# Patient Record
Sex: Female | Born: 1945 | Race: Black or African American | Hispanic: No | Marital: Married | Smoking: Never smoker
Health system: Southern US, Community
[De-identification: ages and names within clinical notes are randomized; demographics above are authoritative.]

## PROBLEM LIST (undated history)

## (undated) DIAGNOSIS — Z8619 Personal history of other infectious and parasitic diseases: Secondary | ICD-10-CM

## (undated) DIAGNOSIS — Z87898 Personal history of other specified conditions: Secondary | ICD-10-CM

## (undated) DIAGNOSIS — I10 Essential (primary) hypertension: Secondary | ICD-10-CM

## (undated) DIAGNOSIS — R32 Unspecified urinary incontinence: Secondary | ICD-10-CM

## (undated) DIAGNOSIS — B379 Candidiasis, unspecified: Secondary | ICD-10-CM

## (undated) DIAGNOSIS — R51 Headache: Secondary | ICD-10-CM

## (undated) HISTORY — PX: REPLACEMENT TOTAL KNEE BILATERAL: SUR1225

## (undated) HISTORY — DX: Essential (primary) hypertension: I10

## (undated) HISTORY — DX: Candidiasis, unspecified: B37.9

## (undated) HISTORY — DX: Personal history of other infectious and parasitic diseases: Z86.19

## (undated) HISTORY — DX: Unspecified urinary incontinence: R32

## (undated) HISTORY — DX: Headache: R51

## (undated) HISTORY — PX: TUBAL LIGATION: SHX77

## (undated) HISTORY — DX: Personal history of other specified conditions: Z87.898

---

## 1974-01-23 HISTORY — PX: NEPHRECTOMY: SHX65

## 1974-01-23 HISTORY — PX: OTHER SURGICAL HISTORY: SHX169

## 1997-08-25 HISTORY — PX: ABDOMINAL HYSTERECTOMY: SHX81

## 1998-02-26 ENCOUNTER — Inpatient Hospital Stay (HOSPITAL_COMMUNITY): Admission: RE | Admit: 1998-02-26 | Discharge: 1998-02-28 | Payer: Self-pay | Admitting: Gynecology

## 1999-06-19 ENCOUNTER — Ambulatory Visit (HOSPITAL_COMMUNITY): Admission: RE | Admit: 1999-06-19 | Discharge: 1999-06-19 | Payer: Self-pay | Admitting: Gastroenterology

## 1999-06-19 ENCOUNTER — Encounter: Payer: Self-pay | Admitting: Gastroenterology

## 2000-02-03 ENCOUNTER — Encounter: Payer: Self-pay | Admitting: Orthopedic Surgery

## 2000-02-05 ENCOUNTER — Inpatient Hospital Stay (HOSPITAL_COMMUNITY): Admission: RE | Admit: 2000-02-05 | Discharge: 2000-02-11 | Payer: Self-pay | Admitting: Orthopedic Surgery

## 2000-02-11 ENCOUNTER — Inpatient Hospital Stay (HOSPITAL_COMMUNITY)
Admission: RE | Admit: 2000-02-11 | Discharge: 2000-02-15 | Payer: Self-pay | Admitting: Physical Medicine & Rehabilitation

## 2000-03-05 ENCOUNTER — Encounter: Admission: RE | Admit: 2000-03-05 | Discharge: 2000-06-03 | Payer: Self-pay | Admitting: Orthopedic Surgery

## 2000-06-16 ENCOUNTER — Encounter: Admission: RE | Admit: 2000-06-16 | Discharge: 2000-07-06 | Payer: Self-pay | Admitting: Orthopedic Surgery

## 2001-02-08 ENCOUNTER — Other Ambulatory Visit: Admission: RE | Admit: 2001-02-08 | Discharge: 2001-02-08 | Payer: Self-pay | Admitting: Family Medicine

## 2003-02-21 ENCOUNTER — Other Ambulatory Visit: Admission: RE | Admit: 2003-02-21 | Discharge: 2003-02-21 | Payer: Self-pay | Admitting: Family Medicine

## 2004-02-23 ENCOUNTER — Other Ambulatory Visit: Admission: RE | Admit: 2004-02-23 | Discharge: 2004-02-23 | Payer: Self-pay | Admitting: Family Medicine

## 2005-02-12 ENCOUNTER — Inpatient Hospital Stay (HOSPITAL_COMMUNITY): Admission: RE | Admit: 2005-02-12 | Discharge: 2005-02-16 | Payer: Self-pay | Admitting: Orthopedic Surgery

## 2005-05-26 ENCOUNTER — Other Ambulatory Visit: Admission: RE | Admit: 2005-05-26 | Discharge: 2005-05-26 | Payer: Self-pay | Admitting: Family Medicine

## 2006-08-26 ENCOUNTER — Encounter: Admission: RE | Admit: 2006-08-26 | Discharge: 2006-08-26 | Payer: Self-pay | Admitting: Family Medicine

## 2007-12-09 ENCOUNTER — Encounter: Admission: RE | Admit: 2007-12-09 | Discharge: 2007-12-09 | Payer: Self-pay | Admitting: Family Medicine

## 2008-12-13 ENCOUNTER — Encounter: Admission: RE | Admit: 2008-12-13 | Discharge: 2008-12-13 | Payer: Self-pay | Admitting: Internal Medicine

## 2009-01-01 ENCOUNTER — Emergency Department (HOSPITAL_COMMUNITY): Admission: EM | Admit: 2009-01-01 | Discharge: 2009-01-01 | Payer: Self-pay | Admitting: Emergency Medicine

## 2010-01-30 ENCOUNTER — Encounter: Admission: RE | Admit: 2010-01-30 | Discharge: 2010-01-30 | Payer: Self-pay | Admitting: Internal Medicine

## 2011-01-09 ENCOUNTER — Other Ambulatory Visit: Payer: Self-pay | Admitting: Internal Medicine

## 2011-01-09 DIAGNOSIS — Z1231 Encounter for screening mammogram for malignant neoplasm of breast: Secondary | ICD-10-CM

## 2011-01-10 NOTE — Discharge Summary (Signed)
[place]. [facility]  Patient:    Nicole Oconnor, Nicole Oconnor                       MRN: [id number] Adm. Date:  [id number] Disc. Date: 02/15/00 Attending:  Herold Harms Dictator:   Bynum Bellows. Idacavage, P.A.C. CC:         Faith Rogue, M.D.             Daniel L. Thomasena Edis, M.D.             Hadassah Pais. Jeannetta Nap, M.D.             John L. Dorothyann Gibbs, M.D.                           Discharge Summary  DIAGNOSES: 1. Right total knee arthroplasty. 2. Hypertension. 3. Electrolyte imbalance. 4. Pruritic rash. 5. Coumadin anticoagulation.  HISTORY OF PRESENT ILLNESS AND HOSPITAL COURSE:  The patient is a 65 year old female with history of bilateral knee pain who elected to undergo right total knee arthroplasty February 05, 2000, by Dr. Priscille Kluver.  She was placed on Coumadin for DVT prophylaxis.  The patient was diuresed.  She did have hyponatremia and hypokalemia which were corrected.  It was felt that the patient would require inpatient rehabilitation prior to returning home.  Therefore, on February 11, 2000, she was admitted to inpatient rehabilitation, where she has participated in three hours per day of physical and occupational therapy.  She was noted to be subtherapeutic on her Coumadin upon admission and, therefore, was placed on Lovenox 40 mg subcutaneous q.d. until her INR was in the therapeutic range. She did have elevated LFTs in the following trend.  On February 03, 2000, her alkaline phosphatase was 58; however, on February 12, 2000, it was 176.  Her SGOT on February 03, 2000, was 15, and on February 12, 2000, was 10.  Her SGPT on February 03, 2000, was 50, and on February 12, 2000, was 181.  These levels were rechecked on February 14, 2000, and did, indeed, appear to be trending downward.  Alkaline phosphatase was 162, SGOT 66, and SGPT 137.  This was felt to be due to anesthetic or side effect of surgery.  The patient was noted to have positive nitrates in her urine, and she was started on Cipro  for this.  Her urine did grow out 1000 colonies of E. coli, which was sensitive to Cipro.  Her blood pressures remained controlled on her present regimen, and she did quite well in physical therapy.  At the time of discharge she was noted to be modified independent with bed mobility and transfers and able to ambulate 200 feet with a standard walker.  DISCHARGE MEDICATIONS: 1. Coumadin daily until March 04, 2000. 2. Percocet 1 or 2 every four hours as needed for pain. 3. Potassium 20 mEq daily. 4. OxyContin CR 10 mg every 12 hours for five days. 5. Multivitamin daily. 6. Pepcid 20 mg at bedtime. 7. Atenolol 50 mg daily.  8. Hydrochlorothiazide 50 mg daily.  9. Vitamin E 400 IU daily. 10. Os-Cal 1000 mg daily. 11. Cipro 500 mg b.i.d.  ACTIVITY:  The patient is instructed to weightbear as tolerated, use walker.  DIET:  Follow a balanced diet.  WOUND CARE:  Keep wound clean and dry.  DISCHARGE INSTRUCTIONS:  Go to Dr. Jeannetta Nap office on Wednesday for PT/INR.  He will be following as per  his nurse.  FOLLOW-UP:  Patient instructed to follow up with Dr. Priscille Kluver in two weeks and Dr. Jeannetta Nap as he instructs.  CONDITION ON DISCHARGE:  Stable. DD:  02/14/00 TD:  02/16/00 Job: [id number]

## 2011-01-10 NOTE — Discharge Summary (Signed)
NAME:  Nicole Oconnor, Nicole Oconnor                ACCOUNT NO.:  [id number]   MEDICAL RECORD NO.:  [id number]          PATIENT TYPE:  INP   LOCATION:  1512                         FACILITY:  [facility]   PHYSICIAN:  John L. Rendall, M.D.  DATE OF BIRTH:  [birth year]   DATE OF ADMISSION:  02/12/2005  DATE OF DISCHARGE:  02/16/2005                                 DISCHARGE SUMMARY   ADMISSION DIAGNOSES:  1.  End-stage osteoarthritis, bilateral knees, status post right total knee      arthroplasty in 2001.  2.  History of hypertension.  3.  History of obesity.  4.  History of solitary kidney.  5.  History of constipation.   DISCHARGE DIAGNOSES:  1.  Left total knee arthroplasty.  2.  Postoperative blood loss anemia, not requiring blood transfusion.  3.  Postoperative hypokalemia.  4.  History of hypertension.  5.  History of obesity.  6.  History of solitary kidney.  7.  History of constipation.   HISTORY OF PRESENT ILLNESS:  The patient is a 65 year old black female with  a history of right total knee arthroplasty in 2001 with good results.  He  presents with a 5-year history of progressively worsening left knee pain.  He fell on the ice in December of 2005 and twisted his left knee with  worsening of the pain and continuous pain since that time, a deep aching  sensation with sharp shooting pains over the joint with radiations up an  down the tibia, worsened with walking.  She does have popping, catching,  grinding and night pain.   ALLERGIES:  PENICILLIN, TAPE INCLUDING BAND-AIDS and PAPER TAPE.   CURRENT MEDICATIONS:  1.  Hydrochlorothiazide 50 mg p.o. daily.  2.  Atenolol 50 mg p.o. daily.  3.  Aspirin 81 mg p.o. daily.  4.  Multivitamin once a day.  5.  Vitamin E 400 mg p.o. daily.  6.  Calcium 1200 mg p.o. daily.  7.  Potassium one tablet p.o. daily.  8.  Vitamin C 500 mg p.o. daily.   SURGICAL PROCEDURE:  On February 12, 2005, the patient was taken to the  operating room by Dr. Jonny Ruiz L.  Rendall, assisted by Richardean Canal, P.A.-C.  Under general anesthesia, the patient underwent a left total knee  arthroplasty with the following components:  A size standard left femoral  component, a size 2.5 keeled tibial tray, a standard patella, a size 20-mm  bearing.  All components were implanted with polymethylmethacrylate.  The  patient tolerated the procedure well and was transferred to the recovery  room and then to the orthopedic floor for total knee protocol and IV pain  medicines, antibiotic, DVT prophylaxis and leg in a CPM.   CONSULTATIONS:  The following consults were requested:  1.  Physical Therapy.  2.  Occupational Therapy.  3.  Case Management.   HOSPITAL COURSE:  On February 12, 2005, the patient was admitted to [facility] under the care of Dr. Jonny Ruiz Rendall.  The patient was taken to the  OR where a left total knee arthroplasty was performed  without any  complications.  The patient was transferred to the recovery room and to the  orthopedic floor in good condition on IV antibiotics, pain medications and  leg in a CPM, and Arixtra for DVT prophylaxis, to follow with total knee  protocol.  The patient incurred a total of 4 days of postoperative  __________ on the orthopedic floor without any complications.   The patient did develop some postoperative blood loss anemia with her  hemoglobin dropping to 9.6, but her vital signs remained stable.  She did  not require any blood transfusion.  This was allowed to self-correct with  supplemental replacement.  The patient did develop some significant  postoperative hypokalemia; potassium dropped to 2.7, but with IV replacement  and p.o. replacement over several days, it did improve.  The patient's wound  remained benign for any signs of infection.  Her leg remained neuromotor and  vascularly intact.  The patient worked well with Physical Therapy on a day-  to-day basis.  The patient was able to transition from IV  medications to  p.o. medications well, so arrangements were made on postoperative day #4 for  outpatient home health physical therapy.  The patient did have a urine  culture that showed 100,000 colonies of E. coli, so the patient was placed  on Cipro prior to being discharged home in good condition.   LABORATORY DATA:  CBC on February 15, 2005:  WBC of 14.4, felt to be due to  urinary tract infection, hemoglobin 9.7, hematocrit 28.9.  The patient was  asymptomatic, out of bed with Physical Therapy.  Platelets were 250,000.   Routine chemistries on February 15, 2005 showed a sodium of 132, potassium 3.8,  glucose 134; it was 96 on admission so it was felt to be postoperative  stress from activity.  We will be following her as an outpatient.  BUN 8,  creatinine 0.9.  A urine culture was 100,000 colonies of E. coli, sensitive  to all routine antibiotics.   MEDICATIONS ON DISCHARGE FROM ORTHOPEDIC FLOOR:  1.  Hydrochlorothiazide 50 mg p.o. daily.  2.  Multivitamins with minerals one tablet p.o. daily.  3.  Arixtra 2.5 mg daily.  4.  Colace 100 mg p.o. b.i.d.  5.  Trinsicon one tablet p.o. t.i.d.  6.  Tenormin 50 mg p.o. daily.  7.  Potassium chloride 20 mEq p.o. b.i.d.  8.  Laxative and enema of choice p.r.n.  9.  Percocet 5 mg p.o. q.4 h. p.r.n.  10. Tylenol 750 mg p.o. q.4 h. p.r.n.  11. Robaxin 500 mg p.o. q.6 h. p.r.n.  12. Restoril 50 mg p.o. nightly p.r.n.  13. Reglan 10 mg p.o. q.8 h. p.r.n.   DISCHARGE INSTRUCTIONS:   DIET:  No restrictions.   ACTIVITY:  Walk with the assistance of a walker.   WOUND CARE:  The patient is to keep wound clean, change dressing daily, may  shower on the following Monday.   SPECIAL DISCHARGE INSTRUCTIONS:  For any concerns of infection or any other  problems, the patient is to call Dr. Sable Feil office for advise.   DISCHARGE MEDICATIONS:  The patient was to resume routine home medications  with the addition of the following: 1.  Arixtra 2.5 mg  injection once a day for 3 more days.  2.  Percocet 5 mg one or two tablets every 4-6 hours for pain if needed.  3.  Robaxin 500 mg one tablet every 6 hours for muscle spasms if needed.  4.  Cipro 500 mg one tablet twice a day for 7 days.   FOLLOWUP:  1.  The patient needs a followup appointment with Dr. Priscille Kluver; the patient      is to call [phone] for a followup appointment in 2 weeks.  2.  Home health physical therapy by Genevieve Norlander.   CONDITION UPON DISCHARGE:  The patient's condition upon discharge to home is  listed as improved and good.       RWK/MEDQ  D:  03/04/2005  T:  03/04/2005  Job:  [id number]

## 2011-01-10 NOTE — Discharge Summary (Signed)
[facility]  Patient:    Nicole Oconnor, Nicole Oconnor                       MRN: [id number] Adm. Date:  [id number] Disc. Date: 02/11/00 Attending:  Carolan Shiver Ii Dictator:   Jamelle Rushing, P.A.                           Discharge Summary  ADMISSION DIAGNOSES: 1. Osteoarthritis, right knee. 2. Hypertension. 3. History of one kidney.  DISCHARGE DIAGNOSES: 1. Status post right total knee arthroplasty. 2. Hypertension. 3. Constipation. 4. Hypokalemia. 5. Hyponatremia.  HISTORY OF PRESENT ILLNESS:  This is a 65 year old black female with a six to seven year history of bilateral knee pain.   The knee problems on the right started with popping and squeaking sensation and gradually increased and developed pain.  The patient did have occasional giving out and generalized warmth feeling in the knee.  The patient describes the pain as a constant aching sensation with occasional sharp shooting pains with awkward movements. The patient does have an aching while in bed and difficulty sleeping.  ALLERGIES:  Penicillin.  When tapes are applied to her neck she does have a rash and itching but when the same tapes are applied to other places on her body she does not have the same reaction.  CURRENT MEDICATIONS ON ADMISSION:  Hydrochlorothiazide 50 mg p.o. q.d. Atenolol 50 mg p.o. q.d.  Baby aspirin once a day.  Multivitamin one tablet p.o. q.d.  Vitamin E 1000 mg p.o. q.d.  Calcium 1200 mg p.o. q.d.  SURGICAL PROCEDURE:  On February 05, 2000 patient was taken to the OR by Dr. Jonny Ruiz L. Rendall assisted by Jamelle Rushing, P.A.-C. and underwent right total knee arthroplasty under general anesthesia.  The patient tolerated the surgical procedure well without any complications and was transferred to the recovery room in good condition. Prior to transferring to the recovery room the patient had a epidural placed for postoperative analgesia.  CONSULTS:  On February 05, 2000  anesthesia consult was requested for postoperative analgesia with epidural.  On February 05, 2000 the following routine consults were also requested:  Physical therapy, occupational therapy, rehabilitation, and pharmacy for Coumadin dosing.  HOSPITAL COURSE:  On February 05, 2000 the patient was admitted to [facility] under the care of Dr. Jonny Ruiz L. Rendall. The patient was taken to the OR where a right total knee arthroplasty was performed without any complications under general anesthesia.  The patient had an epidural placed for postoperative analgesia.  The patient did have some problems with postoperative analgesia requiring several calls to anesthesia throughout the night for some hypotension which was not responsive to fluid boluses.  The patient had multiple fluid boluses and decreased amount of epidural administration throughout the night.  The patient continued to maintain excellent pain control through this course.  Postoperative day one, the patient was fairly comfortable after being converted over to the morphine PCA having the epidural discontinued throughout the night.  The patient was reported to have been hypertensive throughout the night and received multiple fluid boluses and at the current time, I calculated she was ahead 2600 cc of fluid just since reaching the orthopaedic floor. The patient had no other complaints.  No shortness of breath or chest pain, nausea.  She was tolerating the CPM 0 to 60 degrees.  According to the CPM but due to  loose straps, the patient was actually only flexing from about 0 to 25 degrees.  The patients vital signs at the current time, she was afebrile with a blood pressure of 97/52.  H&H was stable and her chemistries at the current time were also stable.  It was felt that since she was ahead that far in fluid and that some mild extra diuresis throughout the day would improve her fluid balance and prevent any rebound hypertension once the  PCA is discontinued.  She was given Lasix 40 mg IV today with close monitoring of her ins and outs and breath sounds.  Postoperative day two, the patients only complaint was feeling like she needed to have a bowel movement.  Otherwise she had shortness of breath or chest discomfort.  She was in fact in the CMP 0 to 40 degrees and that was when she was actually bending.  She had temperature of 101. Vital signs: Blood pressure 146/85.  Her H&H was in fact stable. Her sodium dropped to 124, potassium was down to 3.0. This was felt related to her diuresis the previous day.  Otherwise, the patient was doing very well and was to start physical therapy today.  Due to her potassium and sodium dropping it was felt that we would give her 20 mEq p.o. q.d. of potassium chloride and change her IV over to normal saline and run it at 80 cc an hour over the next 12 hours and monitor her ins and outs.  The patient was also to get LOC or EOC to help her bowel movement.  Over the next several days, the patient continued to do very well without much complaints other than residual discomfort and difficulty having bowel movements.  The patient lung sounds remained clear and equal bilaterally with no complaints of chest pain or shortness of breath.  Her I&R gradually improved to the therapeutic level for DVT prophylaxis.  On postoperative day five, the patient was at the bedside commode. She had had previous bowel movements but she still had a moderate amount of abdominal discomfort with the feeling like she needed to have a good bowel movement and was unable to do so. She had no nausea or vomiting and was tolerating her diet well.  The patient was progressing slowly at this time with physical therapy and it was felt that reevaluation by rehab services was warranted for rehab services for four or five days.  BMET was requested and her sodium and potassium are much improved and within normal limits at the  current time. The patient was also to get an enema to aid in improvement of her discomfort.  On postoperative day six, the patient was feeling much better today, having  had a good bowel movement on the day previous.  The patient did have some calf tenderness the previous night so a venous Doppler was obtained to rule out any DVT and this was in fact negative.  The patient had no other complaints at this time and felt she was ready to go to rehab to improve her mobility.  It was anticipated after reading the previous note from rehab services that we would get preauthorization from patients insurance company and bed would be available today, so she would be in fact transferred to rehab services today.  LABORATORY DATA:  CBC on February 08, 2000 WBC 10.9 down from 15.6 on the other previous date.  Hemoglobin was 10.0, hematocrit 28.8 with 257,000 platelets. Coagulation studies found PT of 17.0 with 1.7  INR.  Routine chemistries on February 10, 2000 found sodium 138, potassium 3.3, all other values normal.  Urinalysis was normal on admission.  EKG on admission was normal sinus at 65 beats per minute.  DISCHARGE MEDICATIONS: 1. Colace 100 mg p.o. b.i.d. 2. Atenolol 50 mg p.o. q.d. 3. Multivitamins one tablet p.o. q.d. 4. Hydrochlorothiazide 50 mg p.o. q.d. 5. Calcium carbonate 1000 mg p.o. q.d. 6. Vitamin E 400 iu p.o. q.d. 7. OxyContin CR 20 mg p.o. q.12h. 8. Potassium chloride 20 mEq p.o. q.d.  CONDITION ON DISCHARGE TO REHAB SERVICES:  Improved. DD:  02/11/00 TD:  02/11/00 Job: [id number]

## 2011-01-10 NOTE — H&P (Signed)
NAME:  Nicole Oconnor, Nicole Oconnor                ACCOUNT NO.:  [id number]   MEDICAL RECORD NO.:  [id number]          PATIENT TYPE:  INP   LOCATION:  NA                           FACILITY:  [facility]   PHYSICIAN:  John L. Rendall, M.D.  DATE OF BIRTH:  05-04-1946   DATE OF ADMISSION:  02/12/2005  DATE OF DISCHARGE:                                HISTORY & PHYSICAL   CHIEF COMPLAINT:  Left knee pain on and off for about the last five years.   HISTORY OF PRESENT ILLNESS:  This 65 year old black female patient presented  to Dr. Priscille Kluver with a history of a right knee replacement done by Dr.  Priscille Kluver on February 05, 2000.  Her right knee has been doing well, but her left  knee has been bothering her on and off since she had her right knee done.  In December 2005, she fell on some ice and twisted the left knee.  Since  that time, the left knee pain has been continuous.  Otherwise, the knee had  been getting progressively worse over time.   At this point, the pain is described as constant ache which can become very  sharp or stabbing at times over the joint line, with occasional radiation  into the tibia.  Pain increases with any prolonged walking, and then  decreases with the use of Tylenol.  She is unable to take NSAIDs due to  having only one kidney.  The knee does pop at times.  It catches, grinds,  and it can keep her up at night.  No locking or giving way.  She is  currently taking Tylenol or Darvocet for pain relief, and that provides just  some relief.  She has received two cortisone injections in the past, and the  first one helped for a couple of weeks.  The second one did not help at all.  She currently uses a cane just as needed.   ALLERGIES:  1.  PENICILLIN causes throat edema.  2.  ADHESIVE TAPE.  She gets a rash and itching due to tape.  Paper tape      seems to do this the least.   CURRENT MEDICATIONS:  1.  Hydrochlorothiazide 50 mg 1 tablet p.o. q.a.m.  2.  Atenolol 50 mg 1 tablet p.o.  q.a.m.  3.  Aspirin 81 mg 1 tablet p.o. q.a.m., last dose February 02, 2005.  4.  Multivitamin 1 tablet p.o. q.a.m.  5.  Vitamin E 400 International Units 1 tablet p.o. q.a.m.  6.  Calcium 1,200 mg 1 tablet p.o. q.a.m.  7.  Potassium 1 tablet p.o. q.a.m.  8.  Vitamin C 500 mg 1 tablet p.o. q.a.m.   PAST MEDICAL HISTORY:  1.  Hypertension, diagnosed April 1999.  2.  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   She denies any history of diabetes mellitus, thyroid disease, hiatal hernia,  peptic ulcer disease, reflux, heart problems, asthma, or any other chronic  medical condition other than noted previously.   PAST SURGICAL HISTORY:  1.  Multiple skin moles removed January 27, 2000.  2.  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  3.  Appendectomy, 1957.  4.  Left breast biopsy, benign, 1994.  5.  Right breast biopsy, benign, 1996.  6.  Hysterectomy, 1999.  7.  Right total knee arthroplasty by Dr. Jonny Ruiz L. Rendall, June 2001.   She denies any complications from the above-mentioned procedures.   SOCIAL HISTORY:  She denies any history of cigarette smoking or drug use.  She does drink an occasional glass of wine.  She is married and has three  children.  She lives with her husband in a two-story house with five to six  steps into the main entrance.  Her husband does have MS.  Most of the living  facilities are on the second floor, so she will have to go upstairs.  Her  medical doctor is Dr. Talmadge Coventry at [phone].  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   FAMILY HISTORY:  Mother died at the age of 84 with a cerebral hemorrhage.  Father died at the age of 48 with a myocardial infarction.  She has one  sister who passed away at age 69 with a cerebral hemorrhage.  She has four  living brothers, one with a history of hypertension.  She has three living  sisters, one with a history of diabetes mellitus.  Her children are alive  and in good health.  She has  two daughters, age 61 and 33, and one son, age  26.   REVIEW OF SYSTEMS:  She does wear glasses at all times.  She has occasional  headaches which she relates as due to sinus congestion.  She does have  occasional tinnitus in the right ear.  She complains of hoarseness first  thing in the morning with damp weather, and that resolves with hot fluids.  She has had problems with a stiff neck and a sore left hip since her fall in  December 2005.  She has been told in the past that she might have a heart  murmur but requires no treatment for it.  She did have bronchitis in 2003.  She has some constipation complaints which she treats just with diet.  She  does not have a living will, and her power of attorney is Lance Bosch,  Sr.  All other systems are negative and noncontributory.   PHYSICAL EXAMINATION:  GENERAL:  Well-developed, well-nourished overweight  black female in no acute distress.  Talks easily with examiner.  Walks  without a limp.  Height 5 feet 5 inches; weight 242 pounds.  BMI is 39.5.  VITAL SIGNS:  Temperature 98.1 degrees Fahrenheit; pulse 64; respirations  18; BP 132/84.  HEENT:  Normocephalic, atraumatic, without frontal or maxillary sinus  tenderness to palpation.  Conjunctivae pink.  Sclerae anicteric.  PERRLA.  EOMs intact.  She has multiple scattered about her face, but they are all  basically only slightly raised.  Hearing grossly intact.  No visible  external ear deformities.  Both ear canals are occluded with a large amount  of soft cerumen.  Nose and nasal septum midline.  Nasal mucosa pink and  moist, without exudates or polyps noted.  Buccal mucosa pink and moist.  Dentition in good repair.  Pharynx without erythema or exudates.  Tongue and  uvula midline.  Tongue without spiculations, and uvula rises equally with  phonation.  NECK:  No visible masses or lesions noted.  Trachea midline.  No palpable lymphadenopathy or thyromegaly.  Carotids +2 bilaterally,  without bruits.  Full range of motion of the cervical spine with pain on the right side with  rotation to the left.  CARDIOVASCULAR:  Heart rate and rhythm regular.  S1, S2 present, with a  grade 2/6 systolic murmur heard best at the right second intercostal space  at the sternal border.  RESPIRATORY:  Respirations even and unlabored.  Breath sounds clear to  auscultation bilaterally, without rales or wheezes noted.  ABDOMEN:  Rounded abdominal contour.  Bowel sounds present x 4 quadrants.  Soft, nontender to palpation, without hepatosplenomegaly or CVA tenderness.  Femoral pulses +1 bilaterally.  Nontender to palpation along the vertebral  column.  BREAST/GU/RECTAL/PELVIC:  These exams deferred at this time.  MUSCULOSKELETAL:  No obvious deformities bilateral upper extremities, with  full range of motion of these extremities without pain.  Radial pulses +2  bilaterally.  She has full range of motion of her hips, ankles, and toes  bilaterally.  DP and PT pulses are +2, and she does have +2 mildly pitting  edema of both lower extremities.  No calf pain with palpation bilaterally.  Right knee has a well-healed midline incision.  No erythema or ecchymosis.  She has full extension and flexion only to 90 degrees.  This may be impeded  because of the size of her thigh.  She does open a little bit to varus and  valgus stress, about 5 degrees to each side, but this seemed to be equal.  She does have some mild pain with palpation over the medial joint line.  No  effusion.  Negative anterior drawer.  Left knee skin intact, without  erythema or ecchymosis.  She does have an area that is depigmented from a  previous cortisone injection along the medial joint line.  She has full  extension of the knee and flexion only to 90 degrees.  Again, this may be  limited due to the size of her thigh.  There is a mild amount of crepitus  with range of motion.  She is acutely tender to palpation on both the  medial  and lateral joint line.  Stable to varus and valgus stress.  Negative  anterior drawer.  NEUROLOGIC:  Alert and oriented x 3.  Cranial nerves II-XII are grossly  intact.  Strength 5/5 bilateral upper and lower extremities.  Rapid  alternating movements intact.  Deep tendon reflexes 2+ bilateral upper and  lower extremities.   RADIOLOGIC FINDINGS:  X-rays taken of the left knee in February 2006 showed  bone-on-bone contact in the knee with femoral subluxation over the tibia  medially.   IMPRESSION:  1.  End-stage osteoarthritis bilateral knees, status post right knee      replacement in June 2001.  2.  Hypertension.  3.  Obesity.  4.  Solitary kidney.  5.  History of constipation.   PLAN:  Ms. Keilman will be admitted to [facility] on February 12, 2005,  where she will undergo a left total knee arthroplasty by Dr. Jonny Ruiz L.  Rendall.  She will undergo all the routine preoperative laboratory tests and studies prior to this procedure.  If we have any medical issues while she is  hospitalized, we will consult Dr. Smith Mince.       KED/MEDQ  D:  02/04/2005  T:  02/04/2005  Job:  [id number]

## 2011-01-10 NOTE — Op Note (Signed)
[place]  Patient:    Nicole Oconnor, Nicole Oconnor                       MRN: [id number] Proc. Date: 02/05/00 Adm. Date:  [id number] Attending:  Carolan Shiver Ii                           Operative Report  PREOPERATIVE DIAGNOSIS:  Osteoarthritis, right knee.  POSTOPERATIVE DIAGNOSIS:  Osteoarthritis, right knee.  PROCEDURE:  Right LCS total knee replacement.  SURGEON:  John L. Dorothyann Gibbs, M.D.  ASSISTANT:  Jamelle Rushing, P.A.  ANESTHESIA:  General.  PATHOLOGY:  Primarily medial compartment osteoarthritis with bow leg deformity and very firm hard bone.  DESCRIPTION OF PROCEDURE:  Under general anesthesia, the patients leg was prepared with DuraPrep and draped as a sterile field with a sterile tourniquet.  It was wrapped out with the Esmarch and the tourniquet was used at 400 mm.  A midline incision was made.  This carried to the medial parapatella.  Multiple small vessels were cauterized.  The femur was sized as a standard.  Proximal tibial resection was then carried out using the first femoral guide.  An intrachondral drill hole was placed.  Using the second guide, anterior and posterior flare of the distal femur was resected after first balancing the flexion at 12.5 mm.  The distance required release of the old medial collateral ligament and capsule medially to balance fairly loose lateral side.  Once this was completed, the intramedullary guide was used and a distal femoral cut was made with a 12.5 extension gap.  A recessing guide was then used.  A lamina spreader was inserted and remnants of the cruciates and collateral ligaments were resected and spurs on the back of the condyle were removed.  Following this, the ______ was inserted and Homan retractor and the tibia sized to the standard plus.  A center peg hole was placed. Trial reduction of a standard plus tibia, 12.5 rotating platform, standard femur, revealed excellent fit and  alignment when the capsule was held in place.  The patella was then osteotomized and three peg holes were drilled. Bony surfaces were then prepared with pulse irrigation.  The tibial component was cemented in place.  The femur and patella were press fit.  Once cement was hardened and excess removed, the tourniquet was let down at 44 minutes. Multiple small vessels were cauterized.  The knee was then closed in layers in flexion using 1-0 Ti-Cron, 2-0 Vicryl, and skin clips.  Operative time one hour and four minutes.  The patient tolerated the procedure well and returned to recovery in good condition.  The knee was infiltrated with Marcaine with morphine with epinephrine prior to application of her dressing. DD:  02/05/00 TD:  02/07/00 Job: [id number]

## 2011-01-10 NOTE — Op Note (Signed)
NAME:  Nicole Oconnor, Nicole Oconnor                ACCOUNT NO.:  [id number]   MEDICAL RECORD NO.:  [id number]          PATIENT TYPE:  INP   LOCATION:  X001                         [facility]ACILITY[facility]   PHYSICIAN:  John L. Rendall, M.D.  DATE OF BIRTH:  07/10/1946   DATE OF PROCEDURE:  02/12/2005  DATE OF DISCHARGE:                                 OPERATIVE REPORT   PREOPERATIVE DIAGNOSIS:  Osteoarthritis, left knee.   SURGICAL PROCEDURE:  Computer-navigation-assisted left LCS total knee  replacement.   POSTOPERATIVE DIAGNOSIS:  Osteoarthritis, left knee.   SURGEON:  John L. Rendall, M.D.   ANESTHESIA:  General with femoral nerve block.   ASSISTANT:  Rexene Edison, P.A.   PROCEDURE:  Under general anesthesia, the left leg is prepared with DuraPrep  and draped as a sterile field.  A midline incision is made after wrapping  out the leg with an Esmarch and inflating a sterile tourniquet at 400 mm.  The patella is everted.  The femur is sized to a standard.  Debridement is  done in preparation for computer navigation.  Two pins are then placed in  the proximal-third tibia and distal-third femur, medially in both cases.  The navigation system is set up.  The femoral head is identified.  Medial  and lateral malleolus are identified.  Proximal tibia is mapped and distal  femur is mapped.  Using the navigation, proximal tibial resection is carried  out with a 9-mm resection.  This is then checked and is within 1 degree of  accuracy when we are completed.  Balancing of the flexion and extension gap  is then done with balancing of collateral ligaments.  Once this is  completed, the navigation is used for the anterior and posterior flare of  the distal femur.  The distal femoral cut and the recessing guide is then  used.  A lamina spreader is inserted.  Remnants of menisci are removed and  cruciates.  Proximal tibia is sized at a 2.5.  Trial seating of a 2.5 tibia,  15 bearing and standard femur reveals  excellent bone fits on tibia and  femur, but too much laxity, particularly in extension.  This was then  upsized to a 17.5, which is improved, but a 20 gave optimal balancing.  We  went from 6.5-degree fixed flexion deformity with a 7-degree fixed varus to  1-degree varus and -2 degrees' hyperextension.  Once this was completed,  Schanz pins were removed, the patella was osteotomized.  Permanent  components were then obtained, bony surfaces prepared for pulse irrigation.  All components were then cemented in place and tourniquet was let down at 1  hour 30 minutes.  The size of the thigh slowed the case somewhat, as there  was a 3-inch proximal lipid layer.  At this point, the excess cement is  removed, multiple small vessels are cauterized, a medium Hemovac drain is  inserted. The fascia is then closed with #1 Tycron, subcu with #1 and 2-0  Vicryl and the skin with clips.  Operative time -- about an hour and 45  minutes.  The patient  tolerated the procedure well.  The knee had excellent  stability in flexion and extension on completion.     JLR/MEDQ  D:  02/12/2005  T:  02/12/2005  Job:  [id number]

## 2011-03-12 ENCOUNTER — Ambulatory Visit
Admission: RE | Admit: 2011-03-12 | Discharge: 2011-03-12 | Disposition: A | Discharge status: Home / Self Care | Payer: Medicare Other | Source: Ambulatory Visit | Attending: Internal Medicine | Admitting: Internal Medicine

## 2011-03-12 DIAGNOSIS — Z1231 Encounter for screening mammogram for malignant neoplasm of breast: Secondary | ICD-10-CM

## 2011-11-05 DIAGNOSIS — N182 Chronic kidney disease, stage 2 (mild): Secondary | ICD-10-CM | POA: Diagnosis not present

## 2011-11-05 DIAGNOSIS — I129 Hypertensive chronic kidney disease with stage 1 through stage 4 chronic kidney disease, or unspecified chronic kidney disease: Secondary | ICD-10-CM | POA: Diagnosis not present

## 2011-11-05 DIAGNOSIS — Z79899 Other long term (current) drug therapy: Secondary | ICD-10-CM | POA: Diagnosis not present

## 2011-11-05 DIAGNOSIS — M109 Gout, unspecified: Secondary | ICD-10-CM | POA: Diagnosis not present

## 2012-02-04 ENCOUNTER — Other Ambulatory Visit: Payer: Self-pay | Admitting: Internal Medicine

## 2012-02-04 DIAGNOSIS — Z1231 Encounter for screening mammogram for malignant neoplasm of breast: Secondary | ICD-10-CM

## 2012-03-11 ENCOUNTER — Encounter: Payer: Medicare Other | Admitting: Obstetrics and Gynecology

## 2012-03-17 ENCOUNTER — Ambulatory Visit: Payer: Medicare Other

## 2012-03-31 ENCOUNTER — Ambulatory Visit (INDEPENDENT_AMBULATORY_CARE_PROVIDER_SITE_OTHER): Payer: Medicare Other | Admitting: Obstetrics and Gynecology

## 2012-03-31 ENCOUNTER — Encounter: Payer: Self-pay | Admitting: Obstetrics and Gynecology

## 2012-03-31 VITALS — BP 118/78 | Ht 63.5 in | Wt 260.0 lb

## 2012-03-31 DIAGNOSIS — Z90711 Acquired absence of uterus with remaining cervical stump: Secondary | ICD-10-CM

## 2012-03-31 DIAGNOSIS — R87610 Atypical squamous cells of undetermined significance on cytologic smear of cervix (ASC-US): Secondary | ICD-10-CM | POA: Diagnosis not present

## 2012-03-31 DIAGNOSIS — Z124 Encounter for screening for malignant neoplasm of cervix: Secondary | ICD-10-CM

## 2012-03-31 DIAGNOSIS — N841 Polyp of cervix uteri: Secondary | ICD-10-CM

## 2012-03-31 DIAGNOSIS — Z01419 Encounter for gynecological examination (general) (routine) without abnormal findings: Secondary | ICD-10-CM

## 2012-03-31 NOTE — Progress Notes (Signed)
  Contraception SCHyst Last pap 2011 wnl Last Mammo 2012 wnl Last Colonoscopy 2012 Last Dexa Scan 3 yrs ago wnl Primary MD Dr. Allyne Gee Abuse at Home None  No complaints  Filed Vitals:   03/31/12 1408  BP: 118/78   ROS: noncontributory  Physical Examination: General appearance - alert, well appearing, and in no distress, multiple Nevi Neck - supple, no significant adenopathy Chest - clear to auscultation, no wheezes, rales or rhonchi, symmetric air entry Heart - normal rate and regular rhythm Abdomen - soft, nontender, nondistended, no masses or organomegaly Breasts - breasts appear normal, no suspicious masses, no skin or nipple changes or axillary nodes Pelvic - normal external genitalia, vulva, vagina, cervix, 2 small (<24mm) polyps in cervical canal Back exam - no CVAT Extremities - no edema, redness or tenderness in the calves or thighs Rectal exam neg masses  A/P s/p SCH and BSO in 1999 Mammo tomorrow Nevi followed by PCP per pt Pap today and removal of endocervical polyps with consent

## 2012-04-01 ENCOUNTER — Ambulatory Visit
Admission: RE | Admit: 2012-04-01 | Discharge: 2012-04-01 | Disposition: A | Discharge status: Home / Self Care | Payer: Medicare Other | Source: Ambulatory Visit | Attending: Internal Medicine | Admitting: Internal Medicine

## 2012-04-01 DIAGNOSIS — Z1231 Encounter for screening mammogram for malignant neoplasm of breast: Secondary | ICD-10-CM

## 2012-04-01 LAB — PAP IG W/ RFLX HPV ASCU

## 2012-04-08 ENCOUNTER — Encounter: Payer: Self-pay | Admitting: Obstetrics and Gynecology

## 2012-04-15 DIAGNOSIS — N959 Unspecified menopausal and perimenopausal disorder: Secondary | ICD-10-CM | POA: Diagnosis not present

## 2012-04-21 DIAGNOSIS — Z79899 Other long term (current) drug therapy: Secondary | ICD-10-CM | POA: Diagnosis not present

## 2012-04-21 DIAGNOSIS — N182 Chronic kidney disease, stage 2 (mild): Secondary | ICD-10-CM | POA: Diagnosis not present

## 2012-04-21 DIAGNOSIS — Z0000 Encounter for general adult medical examination without abnormal findings: Secondary | ICD-10-CM | POA: Diagnosis not present

## 2012-04-21 DIAGNOSIS — H6120 Impacted cerumen, unspecified ear: Secondary | ICD-10-CM | POA: Diagnosis not present

## 2012-04-21 DIAGNOSIS — I129 Hypertensive chronic kidney disease with stage 1 through stage 4 chronic kidney disease, or unspecified chronic kidney disease: Secondary | ICD-10-CM | POA: Diagnosis not present

## 2012-04-21 DIAGNOSIS — M109 Gout, unspecified: Secondary | ICD-10-CM | POA: Diagnosis not present

## 2012-05-25 DIAGNOSIS — H2510 Age-related nuclear cataract, unspecified eye: Secondary | ICD-10-CM | POA: Diagnosis not present

## 2012-05-25 DIAGNOSIS — H04129 Dry eye syndrome of unspecified lacrimal gland: Secondary | ICD-10-CM | POA: Diagnosis not present

## 2012-05-25 DIAGNOSIS — H43819 Vitreous degeneration, unspecified eye: Secondary | ICD-10-CM | POA: Diagnosis not present

## 2012-05-25 DIAGNOSIS — H1045 Other chronic allergic conjunctivitis: Secondary | ICD-10-CM | POA: Diagnosis not present

## 2012-06-03 DIAGNOSIS — Z23 Encounter for immunization: Secondary | ICD-10-CM | POA: Diagnosis not present

## 2012-09-10 DIAGNOSIS — J3489 Other specified disorders of nose and nasal sinuses: Secondary | ICD-10-CM | POA: Diagnosis not present

## 2013-02-17 DIAGNOSIS — N182 Chronic kidney disease, stage 2 (mild): Secondary | ICD-10-CM | POA: Diagnosis not present

## 2013-02-17 DIAGNOSIS — I129 Hypertensive chronic kidney disease with stage 1 through stage 4 chronic kidney disease, or unspecified chronic kidney disease: Secondary | ICD-10-CM | POA: Diagnosis not present

## 2013-02-17 DIAGNOSIS — Z79899 Other long term (current) drug therapy: Secondary | ICD-10-CM | POA: Diagnosis not present

## 2013-02-17 DIAGNOSIS — Z131 Encounter for screening for diabetes mellitus: Secondary | ICD-10-CM | POA: Diagnosis not present

## 2013-03-10 ENCOUNTER — Other Ambulatory Visit: Payer: Self-pay

## 2013-03-10 DIAGNOSIS — Z1231 Encounter for screening mammogram for malignant neoplasm of breast: Secondary | ICD-10-CM

## 2013-04-06 ENCOUNTER — Ambulatory Visit: Payer: Medicare Other

## 2013-05-05 DIAGNOSIS — M109 Gout, unspecified: Secondary | ICD-10-CM | POA: Diagnosis not present

## 2013-05-05 DIAGNOSIS — Z0000 Encounter for general adult medical examination without abnormal findings: Secondary | ICD-10-CM | POA: Diagnosis not present

## 2013-05-05 DIAGNOSIS — I129 Hypertensive chronic kidney disease with stage 1 through stage 4 chronic kidney disease, or unspecified chronic kidney disease: Secondary | ICD-10-CM | POA: Diagnosis not present

## 2013-05-05 DIAGNOSIS — E559 Vitamin D deficiency, unspecified: Secondary | ICD-10-CM | POA: Diagnosis not present

## 2013-05-05 DIAGNOSIS — N182 Chronic kidney disease, stage 2 (mild): Secondary | ICD-10-CM | POA: Diagnosis not present

## 2013-05-05 DIAGNOSIS — Z79899 Other long term (current) drug therapy: Secondary | ICD-10-CM | POA: Diagnosis not present

## 2013-05-05 DIAGNOSIS — I10 Essential (primary) hypertension: Secondary | ICD-10-CM | POA: Diagnosis not present

## 2013-05-12 ENCOUNTER — Ambulatory Visit
Admission: RE | Admit: 2013-05-12 | Discharge: 2013-05-12 | Disposition: A | Discharge status: Home / Self Care | Payer: Medicare Other | Source: Ambulatory Visit

## 2013-05-12 DIAGNOSIS — Z1231 Encounter for screening mammogram for malignant neoplasm of breast: Secondary | ICD-10-CM

## 2013-05-19 ENCOUNTER — Other Ambulatory Visit: Payer: Self-pay | Admitting: Internal Medicine

## 2013-05-19 DIAGNOSIS — R928 Other abnormal and inconclusive findings on diagnostic imaging of breast: Secondary | ICD-10-CM

## 2013-05-31 ENCOUNTER — Ambulatory Visit
Admission: RE | Admit: 2013-05-31 | Discharge: 2013-05-31 | Disposition: A | Discharge status: Home / Self Care | Payer: Medicare Other | Source: Ambulatory Visit | Attending: Internal Medicine | Admitting: Internal Medicine

## 2013-05-31 DIAGNOSIS — R928 Other abnormal and inconclusive findings on diagnostic imaging of breast: Secondary | ICD-10-CM

## 2013-07-25 DIAGNOSIS — H1045 Other chronic allergic conjunctivitis: Secondary | ICD-10-CM | POA: Diagnosis not present

## 2013-07-25 DIAGNOSIS — H04129 Dry eye syndrome of unspecified lacrimal gland: Secondary | ICD-10-CM | POA: Diagnosis not present

## 2013-07-25 DIAGNOSIS — H43819 Vitreous degeneration, unspecified eye: Secondary | ICD-10-CM | POA: Diagnosis not present

## 2013-07-25 DIAGNOSIS — H2510 Age-related nuclear cataract, unspecified eye: Secondary | ICD-10-CM | POA: Diagnosis not present

## 2014-01-23 DIAGNOSIS — I129 Hypertensive chronic kidney disease with stage 1 through stage 4 chronic kidney disease, or unspecified chronic kidney disease: Secondary | ICD-10-CM | POA: Diagnosis not present

## 2014-01-23 DIAGNOSIS — N182 Chronic kidney disease, stage 2 (mild): Secondary | ICD-10-CM | POA: Diagnosis not present

## 2014-01-23 DIAGNOSIS — M109 Gout, unspecified: Secondary | ICD-10-CM | POA: Diagnosis not present

## 2014-01-23 DIAGNOSIS — R609 Edema, unspecified: Secondary | ICD-10-CM | POA: Diagnosis not present

## 2014-03-06 DIAGNOSIS — Z124 Encounter for screening for malignant neoplasm of cervix: Secondary | ICD-10-CM | POA: Diagnosis not present

## 2014-03-06 DIAGNOSIS — Z780 Asymptomatic menopausal state: Secondary | ICD-10-CM | POA: Diagnosis not present

## 2014-03-06 DIAGNOSIS — Z13820 Encounter for screening for osteoporosis: Secondary | ICD-10-CM | POA: Diagnosis not present

## 2014-05-23 ENCOUNTER — Other Ambulatory Visit: Payer: Self-pay

## 2014-05-23 DIAGNOSIS — Z1231 Encounter for screening mammogram for malignant neoplasm of breast: Secondary | ICD-10-CM

## 2014-06-07 DIAGNOSIS — Z0000 Encounter for general adult medical examination without abnormal findings: Secondary | ICD-10-CM | POA: Diagnosis not present

## 2014-06-07 DIAGNOSIS — M109 Gout, unspecified: Secondary | ICD-10-CM | POA: Diagnosis not present

## 2014-06-07 DIAGNOSIS — I10 Essential (primary) hypertension: Secondary | ICD-10-CM | POA: Diagnosis not present

## 2014-06-07 DIAGNOSIS — I129 Hypertensive chronic kidney disease with stage 1 through stage 4 chronic kidney disease, or unspecified chronic kidney disease: Secondary | ICD-10-CM | POA: Diagnosis not present

## 2014-06-07 DIAGNOSIS — N182 Chronic kidney disease, stage 2 (mild): Secondary | ICD-10-CM | POA: Diagnosis not present

## 2014-06-07 DIAGNOSIS — E669 Obesity, unspecified: Secondary | ICD-10-CM | POA: Diagnosis not present

## 2014-06-07 DIAGNOSIS — R7309 Other abnormal glucose: Secondary | ICD-10-CM | POA: Diagnosis not present

## 2014-06-07 DIAGNOSIS — E782 Mixed hyperlipidemia: Secondary | ICD-10-CM | POA: Diagnosis not present

## 2014-06-15 ENCOUNTER — Ambulatory Visit
Admission: RE | Admit: 2014-06-15 | Discharge: 2014-06-15 | Disposition: A | Discharge status: Home / Self Care | Payer: Medicare Other | Source: Ambulatory Visit

## 2014-06-15 DIAGNOSIS — Z1231 Encounter for screening mammogram for malignant neoplasm of breast: Secondary | ICD-10-CM | POA: Diagnosis not present

## 2014-07-31 DIAGNOSIS — H43813 Vitreous degeneration, bilateral: Secondary | ICD-10-CM | POA: Diagnosis not present

## 2014-07-31 DIAGNOSIS — H10413 Chronic giant papillary conjunctivitis, bilateral: Secondary | ICD-10-CM | POA: Diagnosis not present

## 2014-07-31 DIAGNOSIS — H2513 Age-related nuclear cataract, bilateral: Secondary | ICD-10-CM | POA: Diagnosis not present

## 2014-10-26 DIAGNOSIS — Z23 Encounter for immunization: Secondary | ICD-10-CM | POA: Diagnosis not present

## 2014-11-22 DIAGNOSIS — Z6841 Body Mass Index (BMI) 40.0 and over, adult: Secondary | ICD-10-CM | POA: Diagnosis not present

## 2014-11-22 DIAGNOSIS — R609 Edema, unspecified: Secondary | ICD-10-CM | POA: Diagnosis not present

## 2014-11-22 DIAGNOSIS — I10 Essential (primary) hypertension: Secondary | ICD-10-CM | POA: Diagnosis not present

## 2014-11-22 DIAGNOSIS — E782 Mixed hyperlipidemia: Secondary | ICD-10-CM | POA: Diagnosis not present

## 2014-11-22 DIAGNOSIS — E669 Obesity, unspecified: Secondary | ICD-10-CM | POA: Diagnosis not present

## 2014-11-22 DIAGNOSIS — R5383 Other fatigue: Secondary | ICD-10-CM | POA: Diagnosis not present

## 2014-11-22 DIAGNOSIS — R7309 Other abnormal glucose: Secondary | ICD-10-CM | POA: Diagnosis not present

## 2015-06-11 ENCOUNTER — Other Ambulatory Visit: Payer: Self-pay

## 2015-06-11 DIAGNOSIS — Z1231 Encounter for screening mammogram for malignant neoplasm of breast: Secondary | ICD-10-CM

## 2015-07-12 ENCOUNTER — Ambulatory Visit
Admission: RE | Admit: 2015-07-12 | Discharge: 2015-07-12 | Disposition: A | Discharge status: Home / Self Care | Payer: Medicare Other | Source: Ambulatory Visit

## 2015-07-12 DIAGNOSIS — Z1231 Encounter for screening mammogram for malignant neoplasm of breast: Secondary | ICD-10-CM | POA: Diagnosis not present

## 2015-07-27 DIAGNOSIS — E782 Mixed hyperlipidemia: Secondary | ICD-10-CM | POA: Diagnosis not present

## 2015-07-27 DIAGNOSIS — H6122 Impacted cerumen, left ear: Secondary | ICD-10-CM | POA: Diagnosis not present

## 2015-07-27 DIAGNOSIS — I10 Essential (primary) hypertension: Secondary | ICD-10-CM | POA: Diagnosis not present

## 2015-07-27 DIAGNOSIS — Z0000 Encounter for general adult medical examination without abnormal findings: Secondary | ICD-10-CM | POA: Diagnosis not present

## 2015-07-27 DIAGNOSIS — E669 Obesity, unspecified: Secondary | ICD-10-CM | POA: Diagnosis not present

## 2015-07-27 DIAGNOSIS — R7309 Other abnormal glucose: Secondary | ICD-10-CM | POA: Diagnosis not present

## 2015-08-06 DIAGNOSIS — H25013 Cortical age-related cataract, bilateral: Secondary | ICD-10-CM | POA: Diagnosis not present

## 2015-08-06 DIAGNOSIS — H2513 Age-related nuclear cataract, bilateral: Secondary | ICD-10-CM | POA: Diagnosis not present

## 2015-08-06 DIAGNOSIS — H10413 Chronic giant papillary conjunctivitis, bilateral: Secondary | ICD-10-CM | POA: Diagnosis not present

## 2015-08-06 DIAGNOSIS — H43813 Vitreous degeneration, bilateral: Secondary | ICD-10-CM | POA: Diagnosis not present

## 2016-03-13 DIAGNOSIS — I129 Hypertensive chronic kidney disease with stage 1 through stage 4 chronic kidney disease, or unspecified chronic kidney disease: Secondary | ICD-10-CM | POA: Diagnosis not present

## 2016-03-13 DIAGNOSIS — E782 Mixed hyperlipidemia: Secondary | ICD-10-CM | POA: Diagnosis not present

## 2016-03-13 DIAGNOSIS — I10 Essential (primary) hypertension: Secondary | ICD-10-CM | POA: Diagnosis not present

## 2016-03-13 DIAGNOSIS — R7309 Other abnormal glucose: Secondary | ICD-10-CM | POA: Diagnosis not present

## 2016-03-13 DIAGNOSIS — R609 Edema, unspecified: Secondary | ICD-10-CM | POA: Diagnosis not present

## 2016-05-30 ENCOUNTER — Other Ambulatory Visit: Payer: Self-pay | Admitting: Internal Medicine

## 2016-05-30 DIAGNOSIS — Z1231 Encounter for screening mammogram for malignant neoplasm of breast: Secondary | ICD-10-CM

## 2016-06-11 DIAGNOSIS — Z23 Encounter for immunization: Secondary | ICD-10-CM | POA: Diagnosis not present

## 2016-07-15 ENCOUNTER — Ambulatory Visit: Payer: Medicare Other

## 2017-02-09 IMAGING — MG MM SCREENING BREAST TOMO BILATERAL
6 of 9 series · 6 of 25 positions shown · non-contrast
Comparison: Previous exam(s).

CLINICAL DATA: Screening.

EXAM:
DIGITAL SCREENING BILATERAL MAMMOGRAM WITH 3D TOMO WITH CAD

[L XCCL]
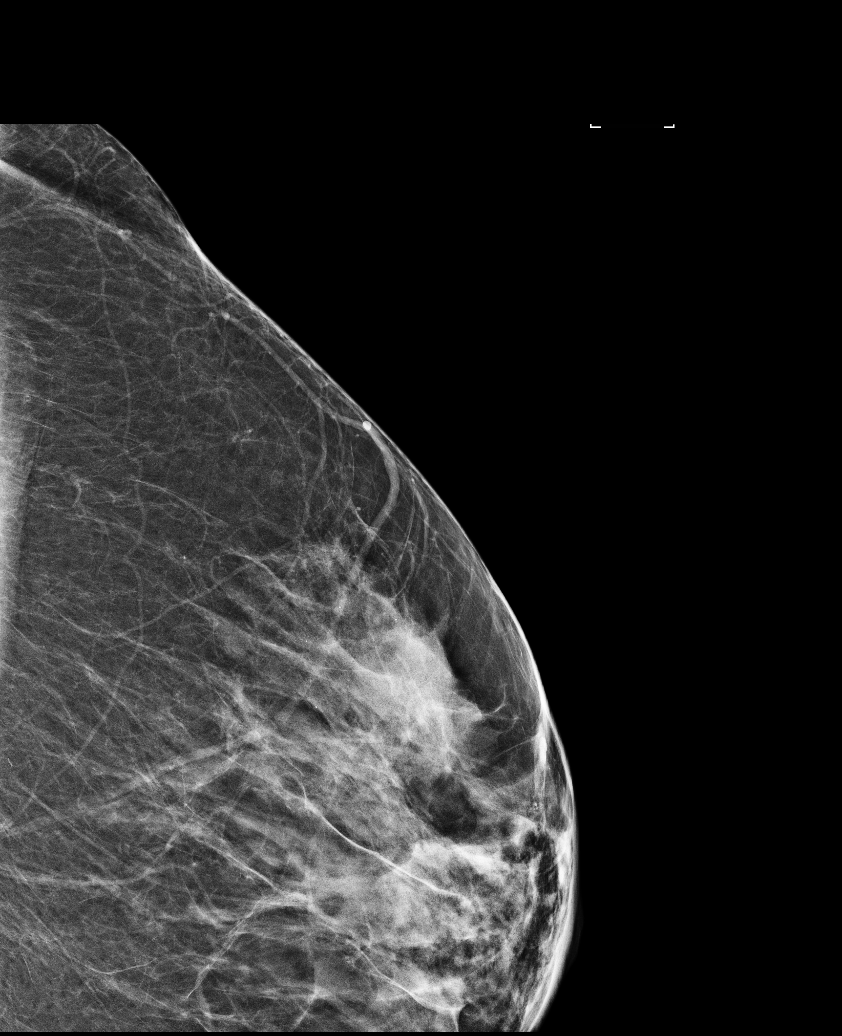

[R CC]
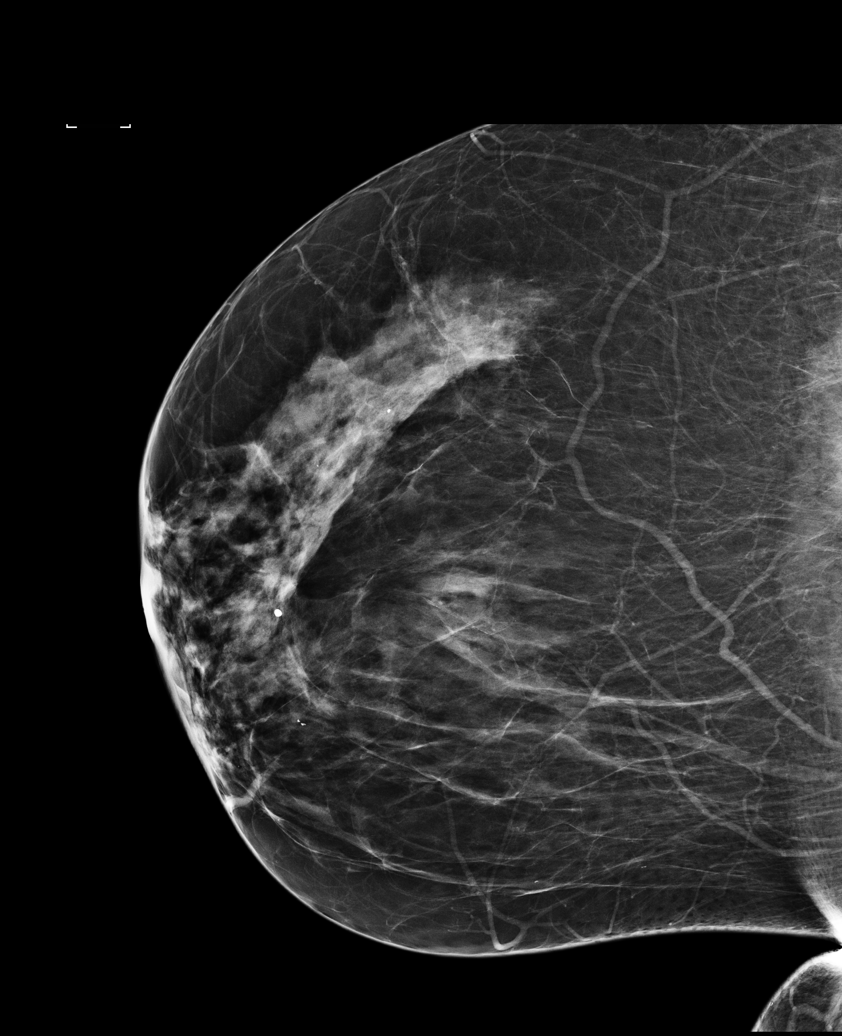

[R MLO]
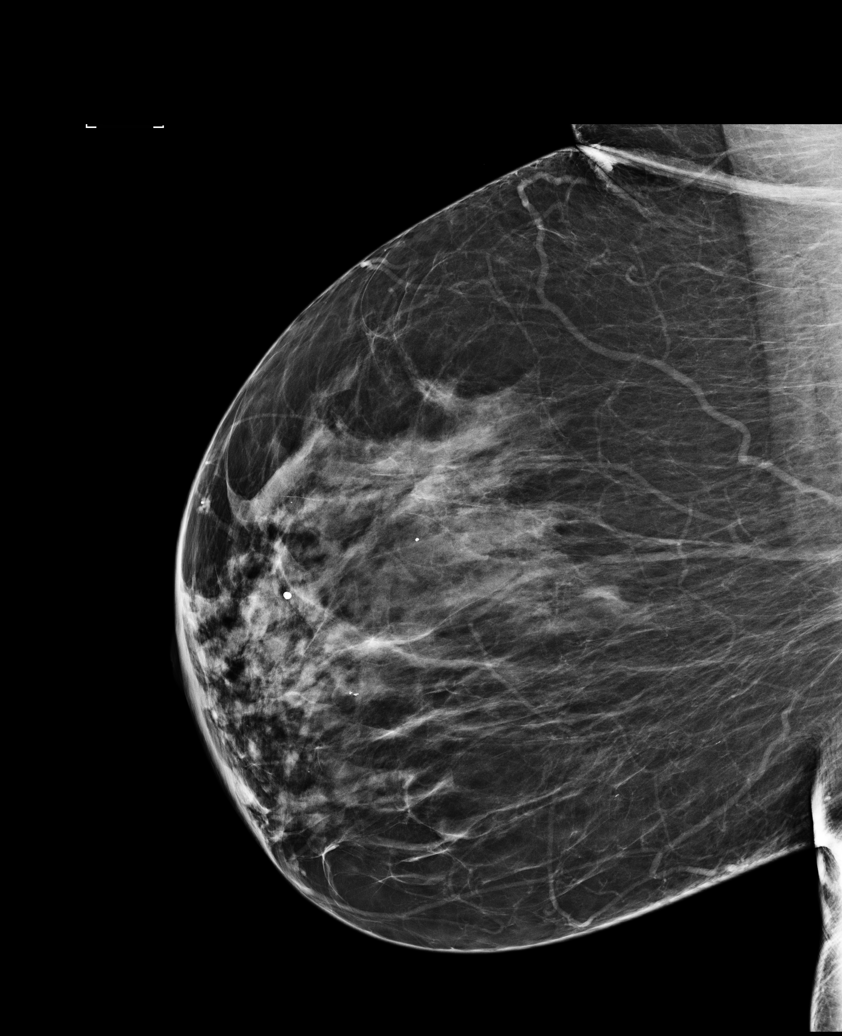

[L MLO]
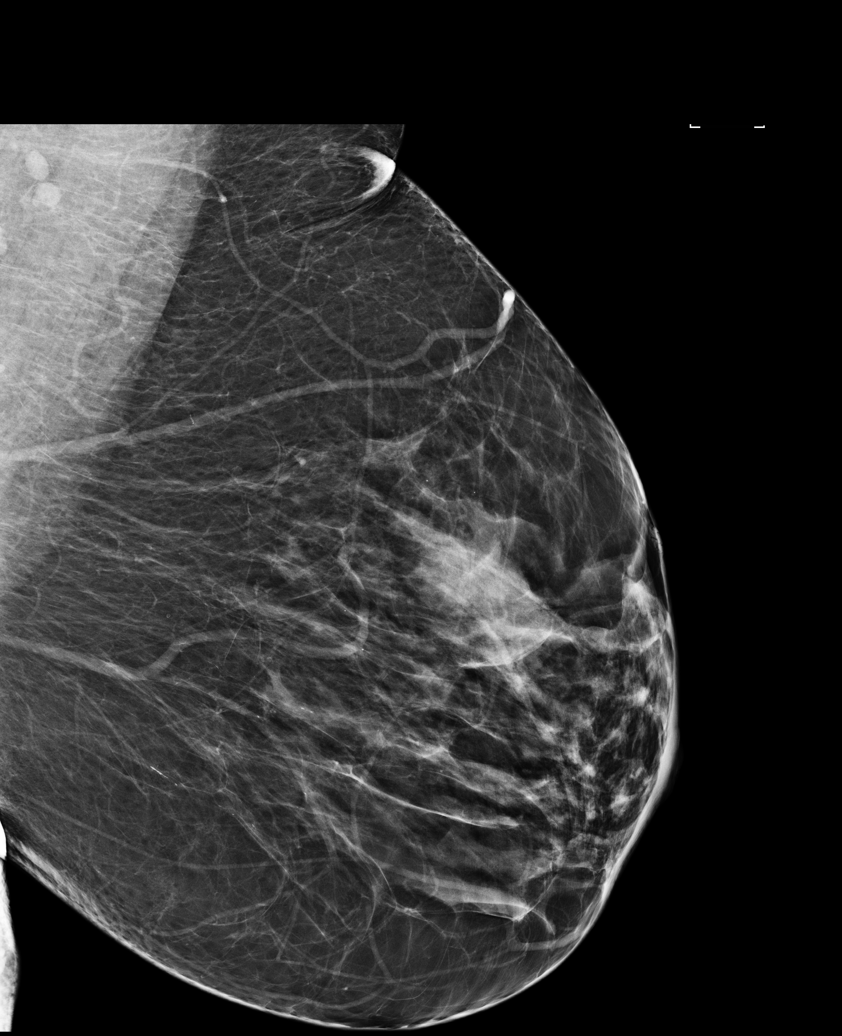

[L CC]
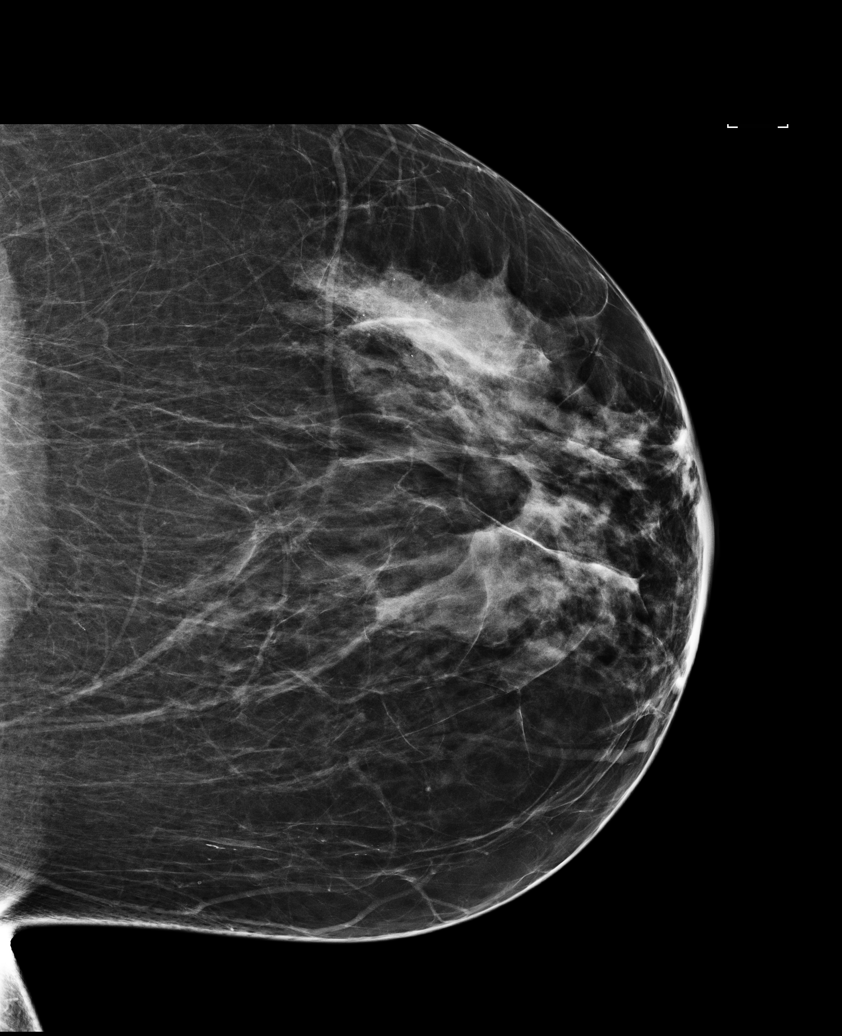

[L MLO tomo · tomo slice 37/74.0]
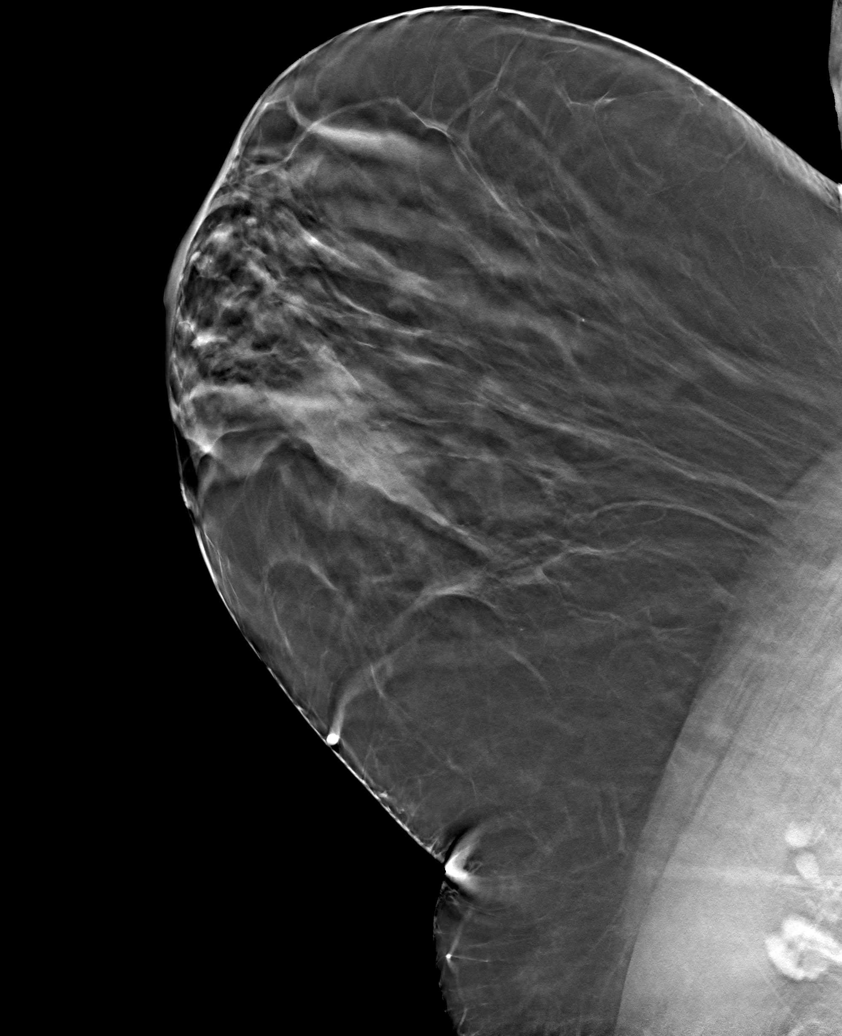

[6 of 25 positions shown; findings below may reference images not displayed]

ACR Breast Density Category b: There are scattered areas of
fibroglandular density.
FINDINGS: There are no findings suspicious for malignancy. Images were
processed with CAD.
IMPRESSION: No mammographic evidence of malignancy. A result letter of this
screening mammogram will be mailed directly to the patient.

RECOMMENDATION:
Screening mammogram in one year. (Code:55-L-23V)

BI-RADS CATEGORY  1: Negative.

## 2017-04-13 ENCOUNTER — Other Ambulatory Visit: Payer: Self-pay | Admitting: Internal Medicine

## 2017-04-13 DIAGNOSIS — E2839 Other primary ovarian failure: Secondary | ICD-10-CM

## 2017-04-15 ENCOUNTER — Ambulatory Visit: Payer: Medicare Other

## 2017-04-24 ENCOUNTER — Inpatient Hospital Stay
Admission: RE | Admit: 2017-04-24 | Discharge: 2017-04-24 | Disposition: A | Discharge status: Home / Self Care | Payer: Medicare Other | Source: Ambulatory Visit | Attending: Internal Medicine | Admitting: Internal Medicine

## 2017-04-24 ENCOUNTER — Ambulatory Visit: Payer: Medicare Other

## 2017-05-13 ENCOUNTER — Ambulatory Visit
Admission: RE | Admit: 2017-05-13 | Discharge: 2017-05-13 | Disposition: A | Discharge status: Home / Self Care | Payer: Medicare Other | Source: Ambulatory Visit | Attending: Internal Medicine | Admitting: Internal Medicine

## 2017-05-13 DIAGNOSIS — Z13820 Encounter for screening for osteoporosis: Secondary | ICD-10-CM | POA: Diagnosis not present

## 2017-05-13 DIAGNOSIS — Z1231 Encounter for screening mammogram for malignant neoplasm of breast: Secondary | ICD-10-CM

## 2017-05-13 DIAGNOSIS — Z780 Asymptomatic menopausal state: Secondary | ICD-10-CM | POA: Diagnosis not present

## 2017-05-13 DIAGNOSIS — E2839 Other primary ovarian failure: Secondary | ICD-10-CM

## 2017-06-03 DIAGNOSIS — Z0000 Encounter for general adult medical examination without abnormal findings: Secondary | ICD-10-CM | POA: Diagnosis not present

## 2017-06-03 DIAGNOSIS — R7309 Other abnormal glucose: Secondary | ICD-10-CM | POA: Diagnosis not present

## 2017-06-03 DIAGNOSIS — Z79899 Other long term (current) drug therapy: Secondary | ICD-10-CM | POA: Diagnosis not present

## 2017-06-03 DIAGNOSIS — M109 Gout, unspecified: Secondary | ICD-10-CM | POA: Diagnosis not present

## 2017-06-03 DIAGNOSIS — N181 Chronic kidney disease, stage 1: Secondary | ICD-10-CM | POA: Diagnosis not present

## 2017-06-03 DIAGNOSIS — E782 Mixed hyperlipidemia: Secondary | ICD-10-CM | POA: Diagnosis not present

## 2017-06-03 DIAGNOSIS — Z6841 Body Mass Index (BMI) 40.0 and over, adult: Secondary | ICD-10-CM | POA: Diagnosis not present

## 2017-06-03 DIAGNOSIS — Z23 Encounter for immunization: Secondary | ICD-10-CM | POA: Diagnosis not present

## 2017-06-03 DIAGNOSIS — I129 Hypertensive chronic kidney disease with stage 1 through stage 4 chronic kidney disease, or unspecified chronic kidney disease: Secondary | ICD-10-CM | POA: Diagnosis not present

## 2018-03-03 DIAGNOSIS — R609 Edema, unspecified: Secondary | ICD-10-CM | POA: Diagnosis not present

## 2018-03-03 DIAGNOSIS — M109 Gout, unspecified: Secondary | ICD-10-CM | POA: Diagnosis not present

## 2018-03-03 DIAGNOSIS — Z7982 Long term (current) use of aspirin: Secondary | ICD-10-CM | POA: Diagnosis not present

## 2018-03-03 DIAGNOSIS — I10 Essential (primary) hypertension: Secondary | ICD-10-CM | POA: Diagnosis not present

## 2018-03-03 LAB — BASIC METABOLIC PANEL
BUN: 15 (ref 4–21)
Creatinine: 0.8 (ref 0.5–1.1)
GLUCOSE: 90
POTASSIUM: 3.7 (ref 3.4–5.3)
SODIUM: 144 (ref 137–147)

## 2018-05-15 ENCOUNTER — Encounter: Payer: Self-pay | Admitting: Nurse Practitioner

## 2018-05-15 DIAGNOSIS — M109 Gout, unspecified: Secondary | ICD-10-CM | POA: Insufficient documentation

## 2018-05-15 DIAGNOSIS — I10 Essential (primary) hypertension: Secondary | ICD-10-CM | POA: Insufficient documentation

## 2018-05-15 DIAGNOSIS — R609 Edema, unspecified: Secondary | ICD-10-CM | POA: Insufficient documentation

## 2018-05-24 ENCOUNTER — Other Ambulatory Visit: Payer: Self-pay | Admitting: Internal Medicine

## 2018-05-24 DIAGNOSIS — Z1231 Encounter for screening mammogram for malignant neoplasm of breast: Secondary | ICD-10-CM

## 2018-06-09 ENCOUNTER — Ambulatory Visit: Payer: Self-pay | Admitting: Internal Medicine

## 2018-06-09 ENCOUNTER — Ambulatory Visit: Payer: Self-pay

## 2018-06-18 DIAGNOSIS — Z23 Encounter for immunization: Secondary | ICD-10-CM | POA: Diagnosis not present

## 2018-06-23 ENCOUNTER — Ambulatory Visit: Payer: Medicare Other

## 2018-07-03 ENCOUNTER — Other Ambulatory Visit: Payer: Self-pay | Admitting: Nurse Practitioner

## 2018-07-14 ENCOUNTER — Ambulatory Visit: Payer: Self-pay | Admitting: Internal Medicine

## 2018-07-30 ENCOUNTER — Ambulatory Visit: Payer: Medicare Other

## 2018-08-28 ENCOUNTER — Other Ambulatory Visit: Payer: Self-pay | Admitting: Nurse Practitioner

## 2018-09-02 ENCOUNTER — Telehealth: Payer: Self-pay | Admitting: Internal Medicine

## 2018-09-02 NOTE — Telephone Encounter (Signed)
I spoke with the patient to schedule her AWV.  She requested a Thursday around 11:00.  She said that she will call back to reschedule if she can't get anyone to stay with her husband that day. VDM (DD)

## 2018-09-09 ENCOUNTER — Ambulatory Visit: Payer: Self-pay

## 2018-09-09 ENCOUNTER — Ambulatory Visit: Payer: Self-pay | Admitting: Nurse Practitioner

## 2018-10-03 ENCOUNTER — Other Ambulatory Visit: Payer: Self-pay | Admitting: Nurse Practitioner

## 2018-10-07 ENCOUNTER — Ambulatory Visit: Payer: Self-pay | Admitting: Nurse Practitioner

## 2018-10-07 ENCOUNTER — Ambulatory Visit: Payer: Self-pay

## 2018-10-29 ENCOUNTER — Other Ambulatory Visit: Payer: Self-pay | Admitting: Nurse Practitioner

## 2018-11-09 ENCOUNTER — Telehealth: Payer: Self-pay | Admitting: Nurse Practitioner

## 2018-11-09 NOTE — Telephone Encounter (Signed)
CALLING PATIENT TO [facility] APPOINTMENT FOR 3/26

## 2018-11-10 ENCOUNTER — Other Ambulatory Visit: Payer: Self-pay

## 2018-11-10 MED ORDER — OLMESARTAN MEDOXOMIL-HCTZ 40-12.5 MG PO TABS: 1.0000 | ORAL_TABLET | Freq: Every day | ORAL | 0 refills | Status: DC

## 2018-11-10 MED ORDER — VALSARTAN-HYDROCHLOROTHIAZIDE 160-25 MG PO TABS: 1.0000 | ORAL_TABLET | Freq: Every day | ORAL | 0 refills | Status: DC

## 2018-11-10 MED ORDER — ALLOPURINOL 300 MG PO TABS: 400.0000 mg | ORAL_TABLET | Freq: Every day | ORAL | 0 refills | Status: DC

## 2018-11-17 ENCOUNTER — Ambulatory Visit: Payer: Self-pay

## 2018-11-18 ENCOUNTER — Ambulatory Visit: Payer: Self-pay | Admitting: Nurse Practitioner

## 2018-11-18 ENCOUNTER — Ambulatory Visit: Payer: Self-pay

## 2018-11-25 ENCOUNTER — Ambulatory Visit: Payer: Self-pay | Admitting: Nurse Practitioner

## 2018-11-25 ENCOUNTER — Ambulatory Visit: Payer: Self-pay

## 2018-12-02 ENCOUNTER — Other Ambulatory Visit: Payer: Self-pay | Admitting: Nurse Practitioner

## 2018-12-16 ENCOUNTER — Telehealth: Payer: Self-pay | Admitting: Nurse Practitioner

## 2018-12-16 ENCOUNTER — Other Ambulatory Visit: Payer: Self-pay

## 2018-12-16 MED ORDER — VALSARTAN-HYDROCHLOROTHIAZIDE 160-25 MG PO TABS: 1.0000 | ORAL_TABLET | Freq: Every day | ORAL | 0 refills | Status: DC

## 2018-12-16 NOTE — Telephone Encounter (Signed)
Pt has consent to a virtual call on her son phone [phone].

## 2018-12-19 ENCOUNTER — Other Ambulatory Visit: Payer: Self-pay | Admitting: Nurse Practitioner

## 2018-12-28 ENCOUNTER — Telehealth: Payer: Self-pay

## 2018-12-28 NOTE — Telephone Encounter (Signed)
Patient called requesting that we call her at [phone] for her conference call on Thursday. YRL,RMA

## 2018-12-30 ENCOUNTER — Ambulatory Visit (INDEPENDENT_AMBULATORY_CARE_PROVIDER_SITE_OTHER): Payer: Medicare Other | Admitting: Nurse Practitioner

## 2018-12-30 ENCOUNTER — Other Ambulatory Visit: Payer: Self-pay

## 2018-12-30 ENCOUNTER — Ambulatory Visit (INDEPENDENT_AMBULATORY_CARE_PROVIDER_SITE_OTHER): Payer: Medicare Other

## 2018-12-30 VITALS — BP 124/57 | HR 59 | Ht 64.0 in | Wt 245.0 lb

## 2018-12-30 DIAGNOSIS — Z0000 Encounter for general adult medical examination without abnormal findings: Secondary | ICD-10-CM

## 2018-12-30 DIAGNOSIS — I10 Essential (primary) hypertension: Secondary | ICD-10-CM | POA: Diagnosis not present

## 2018-12-30 DIAGNOSIS — M1A9XX0 Chronic gout, unspecified, without tophus (tophi): Secondary | ICD-10-CM | POA: Diagnosis not present

## 2018-12-30 MED ORDER — ALLOPURINOL 300 MG PO TABS: 300.0000 mg | ORAL_TABLET | Freq: Every day | ORAL | 1 refills | Status: DC

## 2018-12-30 MED ORDER — ATENOLOL 50 MG PO TABS: 50.0000 mg | ORAL_TABLET | Freq: Every day | ORAL | 1 refills | Status: DC

## 2018-12-30 MED ORDER — LORATADINE 10 MG PO TABS: 10.0000 mg | ORAL_TABLET | Freq: Every day | ORAL | 1 refills | Status: DC

## 2018-12-30 MED ORDER — OLMESARTAN MEDOXOMIL-HCTZ 40-12.5 MG PO TABS: 1.0000 | ORAL_TABLET | Freq: Every day | ORAL | 1 refills | Status: DC

## 2018-12-30 NOTE — Progress Notes (Addendum)
Virtual Visit via telephone    This visit type was conducted due to national recommendations for restrictions regarding the COVID-19 Pandemic (e.g. social distancing) in an effort to limit this patient's exposure and mitigate transmission in our community.  Patients identity confirmed using two different identifiers.  This format is felt to be most appropriate for this patient at this time.  All issues noted in this document were discussed and addressed.  No physical exam was performed (except for noted visual exam findings with Video Visits).    Date:  01/20/2019   ID:  Nicole Oconnor, Nicole Oconnor [birth year], MRN [id number]  Patient Location:  [place]  Provider location:   Office    Chief Complaint:  Hypertension follow up  History of Present Illness:    Nicole Oconnor is a 73 y.o. female who presents via video conferencing for a telehealth visit today.    The patient does not have symptoms concerning for COVID-19 infection (fever, chills, cough, or new shortness of breath).   Hypertension  This is a chronic problem. The current episode started more than 1 year ago. The problem is unchanged. The problem is controlled. Pertinent negatives include no anxiety. There are no associated agents to hypertension. There are no known risk factors for coronary artery disease. Past treatments include angiotensin blockers and diuretics. There are no compliance problems.  There is no history of angina. There is no history of chronic renal disease.     Past Medical History:  Diagnosis Date  . Headache(784.0)    Frequently  . History of measles, mumps, or rubella   . Hypertension   . Leaking of urine   . Yeast infection    Past Surgical History:  Procedure Laterality Date  . ABDOMINAL HYSTERECTOMY  1999  . 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 REPLACEMENT TOTAL KNEE BILATERAL    . TUBAL LIGATION       No outpatient medications have been marked as taking for the 12/30/18 encounter (Office  Visit) with Minette Brine, [place].     Allergies:   Penicillins   Social History   Tobacco Use  . Smoking status: Never Smoker  . Smokeless tobacco: Never Used  Substance Use Topics  . Alcohol use: Not Currently    Comment: occasional wine  . Drug use: No     Family Hx: The patient's family history includes Breast cancer in her sister.  ROS:   Please see the history of present illness.    Review of Systems  Constitutional: Negative.   Respiratory: Negative.   Cardiovascular: Negative.   Neurological: Negative.   Psychiatric/Behavioral: Negative.     All other systems reviewed and are negative.   Labs/Other Tests and Data Reviewed:    Recent Labs: 03/03/2018: BUN 15; Creatinine 0.8; Potassium 3.7; Sodium 144   Recent Lipid Panel No results found for: CHOL, TRIG, HDL, CHOLHDL, LDLCALC, LDLDIRECT  Wt Readings from Last 3 Encounters:  12/30/18 245 lb (111.1 kg)  12/30/18 245 lb (111.1 kg)  03/03/18 245 lb 6.4 oz (111.3 kg)     Exam:    Vital Signs:  BP (!) 124/57 (BP Location: Left Arm, Patient Position: Sitting, Cuff Size: Normal)   Pulse (!) 59   Ht 5\' 4"  (1.626 m)   Wt 245 lb (111.1 kg)   BMI 42.05 kg/m     Physical Exam Unable to visualize due to telephone visit ASSESSMENT & PLAN:    1. Essential hypertension . B/P is  well controlled.  . CMP ordered to check renal function.  . The importance of regular exercise and dietary modification was stressed to the patient.   - olmesartan-hydrochlorothiazide (BENICAR HCT) 40-12.5 MG tablet; Take 1 tablet by mouth daily.  Dispense: 90 tablet; Refill: 1 - atenolol (TENORMIN) 50 MG tablet; Take 1 tablet (50 mg total) by mouth daily.  Dispense: 90 tablet; Refill: 1  2. Chronic gout without tophus, unspecified cause, unspecified site  No recent gout flares  chronic - allopurinol (ZYLOPRIM) 300 MG tablet; Take 1 tablet (300 mg total) by mouth daily.  Dispense: 90 tablet; Refill: 1   COVID-19 Education: The signs  and symptoms of COVID-19 were discussed with the patient and how to seek care for testing (follow up with PCP or arrange E-visit).  The importance of social distancing was discussed today.  Patient Risk:   After full review of this patients clinical status, I feel that they are at least moderate risk at this time.  Time:   Today, I have spent 13 minutes/ seconds with the patient with telehealth technology discussing above diagnoses.     Medication Adjustments/Labs and Tests Ordered: Current medicines are reviewed at length with the patient today.  Concerns regarding medicines are outlined above.   Tests Ordered: No orders of the defined types were placed in this encounter.   Medication Changes: Meds ordered this encounter  Medications  . olmesartan-hydrochlorothiazide (BENICAR HCT) 40-12.5 MG tablet    Sig: Take 1 tablet by mouth daily.    Dispense:  90 tablet    Refill:  1  . atenolol (TENORMIN) 50 MG tablet    Sig: Take 1 tablet (50 mg total) by mouth daily.    Dispense:  90 tablet    Refill:  1  . allopurinol (ZYLOPRIM) 300 MG tablet    Sig: Take 1 tablet (300 mg total) by mouth daily.    Dispense:  90 tablet    Refill:  1  . loratadine (CLARITIN) 10 MG tablet    Sig: Take 1 tablet (10 mg total) by mouth daily.    Dispense:  90 tablet    Refill:  1    Disposition:  Follow up in 3 month(s)  Signed, Minette Brine, FNP

## 2018-12-30 NOTE — Progress Notes (Signed)
Subjective:   Nicole Oconnor is a 73 y.o. female who presents for Medicare Annual (Subsequent) preventive examination.  This visit type was conducted due to national recommendations for restrictions regarding the COVID-19 Pandemic (e.g. social distancing). This format is felt to be most appropriate for this patient at this time. All issues noted in this document were discussed and addressed. No physical exam was performed (except for noted visual exam findings with Video Visits). This patient, Nicole Oconnor, has given permission to perform this visit via telephone. Vital signs may be absent or patient reported.  Patient location: at home  Nurse location:  [place] office     Review of Systems:  n/a Cardiac Risk Factors include: advanced age (>15men, >64 women);hypertension;sedentary lifestyle     Objective:     Vitals: BP (!) 124/57 Comment: per patient  Pulse (!) 59 Comment: per patient  Ht 5\' 4"  (1.626 m) Comment: per patient  Wt 245 lb (111.1 kg) Comment: per patient  BMI 42.05 kg/m   Body mass index is 42.05 kg/m.  Advanced Directives 12/30/2018  Does Patient Have a Medical Advance Directive? Yes  Type of Paramedic of [place];Living will  Copy of [place] in Chart? No - copy requested    Tobacco Social History   Tobacco Use  Smoking Status Never Smoker  Smokeless Tobacco Never Used     Counseling given: Not Answered   Clinical Intake:  Pre-visit preparation completed: Yes  Pain : No/denies pain Pain Score: 0-No pain     Nutritional Status: BMI > 30  Obese Nutritional Risks: None Diabetes: No  How often do you need to have someone help you when you read instructions, pamphlets, or other written materials from your doctor or pharmacy?: 1 - Never What is the last grade level you completed in school?: Master's Degree  Interpreter Needed?: No  Information entered by :: NAllen LPN  Past Medical History:   Diagnosis Date  . Headache(784.0)    Frequently  . History of measles, mumps, or rubella   . Hypertension   . Leaking of urine   . Yeast infection    Past Surgical History:  Procedure Laterality Date  . ABDOMINAL HYSTERECTOMY  1999  . 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 REPLACEMENT TOTAL KNEE BILATERAL    . TUBAL LIGATION     Family History  Problem Relation Age of Onset  . Breast cancer Sister    Social History   Socioeconomic History  . Marital status: Married    Spouse name: Not on file  . Number of children: Not on file  . Years of education: Not on file  . Highest education level: Not on file  Occupational History  . Occupation: retired  Scientific laboratory technician  . Financial resource strain: Not hard at all  . Food insecurity:    Worry: Never true    Inability: Never true  . Transportation needs:    Medical: No    Non-medical: No  Tobacco Use  . Smoking status: Never Smoker  . Smokeless tobacco: Never Used  Substance and Sexual Activity  . Alcohol use: Not Currently    Comment: occasional wine  . Drug use: No  . Sexual activity: Not Currently  Lifestyle  . Physical activity:    Days per week: 0 days    Minutes per session: 0 min  . Stress: Not at all  Relationships  . Social connections:    Talks on phone:  Not on file    Gets together: Not on file    Attends religious service: Not on file    Active member of club or organization: Not on file    Attends meetings of clubs or organizations: Not on file    Relationship status: Not on file  Other Topics Concern  . Not on file  Social History Narrative  . Not on file    Outpatient Encounter Medications as of 12/30/2018  Medication Sig  . allopurinol (ZYLOPRIM) 300 MG tablet Take 1.5 tablets (450 mg total) by mouth daily.  Marland Kitchen aspirin 81 MG tablet Take 81 mg by mouth daily.  Marland Kitchen atenolol (TENORMIN) 50 MG tablet TAKE 1 TABLET BY MOUTH EVERY DAY  . loratadine (CLARITIN) 10 MG tablet Take 10 mg by mouth daily.  Marland Kitchen  olmesartan-hydrochlorothiazide (BENICAR HCT) 40-12.5 MG tablet TAKE 1 TABLET BY MOUTH EVERY DAY  . valsartan-hydrochlorothiazide (DIOVAN-HCT) 160-25 MG tablet Take 1 tablet by mouth daily. Patient needs an appointment (Patient not taking: Reported on 12/30/2018)  . [DISCONTINUED] olmesartan-hydrochlorothiazide (BENICAR HCT) 40-12.5 MG tablet Take 1 tablet by mouth daily.  . [DISCONTINUED] valsartan-hydrochlorothiazide (DIOVAN-HCT) 160-25 MG tablet Take 1 tablet by mouth daily. Patient needs an appointment   No facility-administered encounter medications on file as of 12/30/2018.     Activities of Daily Living In your present state of health, do you have any difficulty performing the following activities: 12/30/2018  Hearing? N  Vision? N  Difficulty concentrating or making decisions? N  Walking or climbing stairs? N  Dressing or bathing? N  Doing errands, shopping? N  Preparing Food and eating ? N  Using the Toilet? N  In the past six months, have you accidently leaked urine? Y  Comment if waits too long  Do you have problems with loss of bowel control? N  Managing your Medications? N  Managing your Finances? N  Housekeeping or managing your Housekeeping? N  Some recent data might be hidden    Patient Care Team: [name], MD as PCP - General (Internal Medicine)    Assessment:   This is a routine wellness examination for [place].  Exercise Activities and Dietary recommendations Current Exercise Habits: The patient does not participate in regular exercise at present  Goals    . Patient Stated (pt-stated)     Wants to start walking       Fall Risk Fall Risk  12/30/2018  Falls in the past year? 0  Risk for fall due to : Medication side effect  Follow up Falls prevention discussed   Is the patient's home free of loose throw rugs in walkways, pet beds, electrical cords, etc?   yes      Grab bars in the bathroom? no      Handrails on the stairs?   yes      Adequate lighting?    yes  Timed Get Up and Go performed: n/a  Depression Screen PHQ 2/9 Scores 12/30/2018  PHQ - 2 Score 0  PHQ- 9 Score 0     Cognitive Function     6CIT Screen 12/30/2018  What Year? 0 points  What month? 0 points  What time? 0 points  Count back from 20 0 points  Months in reverse 0 points  Repeat phrase 0 points  Total Score 0    Immunization History  Administered Date(s) Administered  . Influenza-Unspecified 04/25/2013, 05/25/2014    Qualifies for Shingles Vaccine?   Screening Tests Health Maintenance  Topic Date Due  .  TETANUS/TDAP  07/19/1963  . PNA vac Low Risk Adult (1 of 2 - PCV13) 07/18/2009  . INFLUENZA VACCINE  03/26/2019  . MAMMOGRAM  05/14/2019  . COLONOSCOPY  12/02/2020  . DEXA SCAN  Completed  . Hepatitis C Screening  Completed    Cancer Screenings: Lung: Low Dose CT Chest recommended if Age 75-80 years, 30 pack-year currently smoking OR have quit w/in 15years. Patient does not qualify. Breast:  Up to date on Mammogram? No   Up to date of Bone Density/Dexa? Yes Colorectal: up to date  Additional Screenings: : Hepatitis C Screening: 02/17/2013     Plan:    Wants to start walking   I have personally reviewed and noted the following in the patient's chart:   . Medical and social history . Use of alcohol, tobacco or illicit drugs  . Current medications and supplements . Functional ability and status . Nutritional status . Physical activity . Advanced directives . List of other physicians . Hospitalizations, surgeries, and ER visits in previous 12 months . Vitals . Screenings to include cognitive, depression, and falls . Referrals and appointments  In addition, I have reviewed and discussed with patient certain preventive protocols, quality metrics, and best practice recommendations. A written personalized care plan for preventive services as well as general preventive health recommendations were provided to patient.     Kellie Simmering,  LPN  [id number]

## 2018-12-30 NOTE — Patient Instructions (Signed)
[name] , Thank you for taking time to come for your Medicare Wellness Visit. I appreciate your ongoing commitment to your health goals. Please review the following plan we discussed and let me know if I can assist you in the future.   Screening recommendations/referrals: Colonoscopy: 11/2010 Mammogram: rescheduling after quarantine Bone Density: 04/2017 Recommended yearly ophthalmology/optometry visit for glaucoma screening and checkup Recommended yearly dental visit for hygiene and checkup  Vaccinations: Influenza vaccine: 05/2018 per patient Pneumococcal vaccine: 05/2017 Tdap vaccine: due Shingles vaccine: pending quarantine    Advanced directives: Please bring a copy of your POA (Power of [place]) and/or Living Will to your next appointment.    Conditions/risks identified: Obesity  Next appointment: 12/30/2018 at 3:00   Preventive Care 65 Years and Older, Female Preventive care refers to lifestyle choices and visits with your health care provider that can promote health and wellness. What does preventive care include?  A yearly physical exam. This is also called an annual well check.  Dental exams once or twice a year.  Routine eye exams. Ask your health care provider how often you should have your eyes checked.  Personal lifestyle choices, including:  Daily care of your teeth and gums.  Regular physical activity.  Eating a healthy diet.  Avoiding tobacco and drug use.  Limiting alcohol use.  Practicing safe sex.  Taking low-dose aspirin every day.  Taking vitamin and mineral supplements as recommended by your health care provider. What happens during an annual well check? The services and screenings done by your health care provider during your annual well check will depend on your age, overall health, lifestyle risk factors, and family history of disease. Counseling  Your health care provider may ask you questions about your:  Alcohol use.  Tobacco use.   Drug use.  Emotional well-being.  Home and relationship well-being.  Sexual activity.  Eating habits.  History of falls.  Memory and ability to understand (cognition).  Work and work Statistician.  Reproductive health. Screening  You may have the following tests or measurements:  Height, weight, and BMI.  Blood pressure.  Lipid and cholesterol levels. These may be checked every 5 years, or more frequently if you are over 56 years old.  Skin check.  Lung cancer screening. You may have this screening every year starting at age 59 if you have a 30-pack-year history of smoking and currently smoke or have quit within the past 15 years.  Fecal occult blood test (FOBT) of the stool. You may have this test every year starting at age 56.  Flexible sigmoidoscopy or colonoscopy. You may have a sigmoidoscopy every 5 years or a colonoscopy every 10 years starting at age 30.  Hepatitis C blood test.  Hepatitis B blood test.  Sexually transmitted disease (STD) testing.  Diabetes screening. This is done by checking your blood sugar (glucose) after you have not eaten for a while (fasting). You may have this done every 1-3 years.  Bone density scan. This is done to screen for osteoporosis. You may have this done starting at age 60.  Mammogram. This may be done every 1-2 years. Talk to your health care provider about how often you should have regular mammograms. Talk with your health care provider about your test results, treatment options, and if necessary, the need for more tests. Vaccines  Your health care provider may recommend certain vaccines, such as:  Influenza vaccine. This is recommended every year.  Tetanus, diphtheria, and acellular pertussis (Tdap, Td) vaccine. You  may need a Td booster every 10 years.  Zoster vaccine. You may need this after age 16.  Pneumococcal 13-valent conjugate (PCV13) vaccine. One dose is recommended after age 40.  Pneumococcal  polysaccharide (PPSV23) vaccine. One dose is recommended after age 32. Talk to your health care provider about which screenings and vaccines you need and how often you need them. This information is not intended to replace advice given to you by your health care provider. Make sure you discuss any questions you have with your health care provider. Document Released: 09/07/2015 Document Revised: 04/30/2016 Document Reviewed: 06/12/2015 Elsevier Interactive Patient Education  2017 [place] Prevention in the Home Falls can cause injuries. They can happen to people of all ages. There are many things you can do to make your home safe and to help prevent falls. What can I do on the outside of my home?  Regularly fix the edges of walkways and driveways and fix any cracks.  Remove anything that might make you trip as you walk through a door, such as a raised step or threshold.  Trim any bushes or trees on the path to your home.  Use bright outdoor lighting.  Clear any walking paths of anything that might make someone trip, such as rocks or tools.  Regularly check to see if handrails are loose or broken. Make sure that both sides of any steps have handrails.  Any raised decks and porches should have guardrails on the edges.  Have any leaves, snow, or ice cleared regularly.  Use sand or salt on walking paths during winter.  Clean up any spills in your garage right away. This includes oil or grease spills. What can I do in the bathroom?  Use night lights.  Install grab bars by the toilet and in the tub and shower. Do not use towel bars as grab bars.  Use non-skid mats or decals in the tub or shower.  If you need to sit down in the shower, use a plastic, non-slip stool.  Keep the floor dry. Clean up any water that spills on the floor as soon as it happens.  Remove soap buildup in the tub or shower regularly.  Attach bath mats securely with double-sided non-slip rug tape.   Do not have throw rugs and other things on the floor that can make you trip. What can I do in the bedroom?  Use night lights.  Make sure that you have a light by your bed that is easy to reach.  Do not use any sheets or blankets that are too big for your bed. They should not hang down onto the floor.  Have a firm chair that has side arms. You can use this for support while you get dressed.  Do not have throw rugs and other things on the floor that can make you trip. What can I do in the kitchen?  Clean up any spills right away.  Avoid walking on wet floors.  Keep items that you use a lot in easy-to-reach places.  If you need to reach something above you, use a strong step stool that has a grab bar.  Keep electrical cords out of the way.  Do not use floor polish or wax that makes floors slippery. If you must use wax, use non-skid floor wax.  Do not have throw rugs and other things on the floor that can make you trip. What can I do with my stairs?  Do not leave any items  on the stairs.  Make sure that there are handrails on both sides of the stairs and use them. Fix handrails that are broken or loose. Make sure that handrails are as long as the stairways.  Check any carpeting to make sure that it is firmly attached to the stairs. Fix any carpet that is loose or worn.  Avoid having throw rugs at the top or bottom of the stairs. If you do have throw rugs, attach them to the floor with carpet tape.  Make sure that you have a light switch at the top of the stairs and the bottom of the stairs. If you do not have them, ask someone to add them for you. What else can I do to help prevent falls?  Wear shoes that:  Do not have high heels.  Have rubber bottoms.  Are comfortable and fit you well.  Are closed at the toe. Do not wear sandals.  If you use a stepladder:  Make sure that it is fully opened. Do not climb a closed stepladder.  Make sure that both sides of the  stepladder are locked into place.  Ask someone to hold it for you, if possible.  Clearly mark and make sure that you can see:  Any grab bars or handrails.  First and last steps.  Where the edge of each step is.  Use tools that help you move around (mobility aids) if they are needed. These include:  Canes.  Walkers.  Scooters.  Crutches.  Turn on the lights when you go into a dark area. Replace any light bulbs as soon as they burn out.  Set up your furniture so you have a clear path. Avoid moving your furniture around.  If any of your floors are uneven, fix them.  If there are any pets around you, be aware of where they are.  Review your medicines with your doctor. Some medicines can make you feel dizzy. This can increase your chance of falling. Ask your doctor what other things that you can do to help prevent falls. This information is not intended to replace advice given to you by your health care provider. Make sure you discuss any questions you have with your health care provider. Document Released: 06/07/2009 Document Revised: 01/17/2016 Document Reviewed: 09/15/2014 Elsevier Interactive Patient Education  2017 [employer].

## 2019-01-05 ENCOUNTER — Telehealth: Payer: Self-pay

## 2019-01-05 ENCOUNTER — Other Ambulatory Visit: Payer: Self-pay

## 2019-01-05 NOTE — Telephone Encounter (Signed)
Patient called and cancelled her lab visit due to her not having a mask.YRL,RMA

## 2019-01-17 ENCOUNTER — Encounter: Payer: Self-pay | Admitting: Nurse Practitioner

## 2019-02-23 DIAGNOSIS — F4323 Adjustment disorder with mixed anxiety and depressed mood: Secondary | ICD-10-CM | POA: Diagnosis not present

## 2019-05-18 DIAGNOSIS — Z23 Encounter for immunization: Secondary | ICD-10-CM | POA: Diagnosis not present

## 2019-05-25 ENCOUNTER — Telehealth: Payer: Self-pay | Admitting: Internal Medicine

## 2019-05-25 NOTE — Chronic Care Management (AMB) (Signed)
Chronic Care Management   Note  05/25/2019 Name: Nicole Oconnor MRN: [id number] DOB: 1946/03/31  Nicole Oconnor is a 73 y.o. year old female who is a primary care patient of [name], MD. I reached out to Tally Joe by phone today in response to a referral sent by Nicole Oconnor's health plan.     Nicole Oconnor was given information about Chronic Care Management services today including:  1. CCM service includes personalized support from designated clinical staff supervised by her physician, including individualized plan of care and coordination with other care providers 2. 24/7 contact phone numbers for assistance for urgent and routine care needs. 3. Service will only be billed when office clinical staff spend 20 minutes or more in a month to coordinate care. 4. Only one practitioner may furnish and bill the service in a calendar month. 5. The patient may stop CCM services at any time (effective at the end of the month) by phone call to the office staff. 6. The patient will be responsible for cost sharing (co-pay) of up to 20% of the service fee (after annual deductible is met).  Patient agreed to services and verbal consent obtained.   Follow up plan: Telephone appointment with CCM team member scheduled for: 06/24/2019  [place]  ??[email or web address]   ??[phone]

## 2019-06-24 ENCOUNTER — Telehealth: Payer: Medicare Other

## 2019-06-27 NOTE — Progress Notes (Signed)
This encounter was created in error - please disregard.

## 2019-07-02 ENCOUNTER — Other Ambulatory Visit: Payer: Self-pay | Admitting: Nurse Practitioner

## 2019-07-02 DIAGNOSIS — M1A9XX0 Chronic gout, unspecified, without tophus (tophi): Secondary | ICD-10-CM

## 2019-07-02 DIAGNOSIS — I10 Essential (primary) hypertension: Secondary | ICD-10-CM

## 2019-07-11 ENCOUNTER — Other Ambulatory Visit: Payer: Self-pay | Admitting: Nurse Practitioner

## 2019-07-11 DIAGNOSIS — I10 Essential (primary) hypertension: Secondary | ICD-10-CM

## 2019-07-13 ENCOUNTER — Telehealth: Payer: Self-pay

## 2019-07-13 NOTE — Telephone Encounter (Signed)
Patient called stating she requesting a refill on her medicine and it was denied stating she needs an appointment. She has not been seen since May so I have called her to schedule an appointment I left her a v/m to call the office. YRL,RMA

## 2019-07-16 ENCOUNTER — Other Ambulatory Visit: Payer: Self-pay | Admitting: Nurse Practitioner

## 2019-07-16 DIAGNOSIS — I10 Essential (primary) hypertension: Secondary | ICD-10-CM

## 2019-07-18 ENCOUNTER — Other Ambulatory Visit: Payer: Self-pay

## 2019-07-18 ENCOUNTER — Telehealth: Payer: Self-pay

## 2019-07-18 DIAGNOSIS — I10 Essential (primary) hypertension: Secondary | ICD-10-CM

## 2019-07-18 DIAGNOSIS — M1A9XX0 Chronic gout, unspecified, without tophus (tophi): Secondary | ICD-10-CM

## 2019-07-18 MED ORDER — ALLOPURINOL 300 MG PO TABS: 300.0000 mg | ORAL_TABLET | Freq: Every day | ORAL | 1 refills | Status: DC

## 2019-07-18 MED ORDER — OLMESARTAN MEDOXOMIL-HCTZ 40-12.5 MG PO TABS: 1.0000 | ORAL_TABLET | Freq: Every day | ORAL | 1 refills | Status: DC

## 2019-07-18 NOTE — Telephone Encounter (Signed)
Patient stated her medication was denied because she needs an appointment. She stated she is unable to come into the office because her husband is bed written so I have scheduled her for a virtual (telephone) appointment. YRL,RMA

## 2019-07-20 ENCOUNTER — Telehealth (INDEPENDENT_AMBULATORY_CARE_PROVIDER_SITE_OTHER): Payer: Medicare Other | Admitting: Nurse Practitioner

## 2019-07-20 ENCOUNTER — Other Ambulatory Visit: Payer: Self-pay

## 2019-07-20 VITALS — BP 124/64 | HR 55 | Temp 97.5°F

## 2019-07-20 DIAGNOSIS — G8929 Other chronic pain: Secondary | ICD-10-CM

## 2019-07-20 DIAGNOSIS — M1A9XX0 Chronic gout, unspecified, without tophus (tophi): Secondary | ICD-10-CM | POA: Diagnosis not present

## 2019-07-20 DIAGNOSIS — M25561 Pain in right knee: Secondary | ICD-10-CM | POA: Diagnosis not present

## 2019-07-20 DIAGNOSIS — I10 Essential (primary) hypertension: Secondary | ICD-10-CM

## 2019-07-20 NOTE — Progress Notes (Signed)
Virtual Visit via Telephone   This visit type was conducted due to national recommendations for restrictions regarding the COVID-19 Pandemic (e.g. social distancing) in an effort to limit this patient's exposure and mitigate transmission in our community.  Due to her co-morbid illnesses, this patient is at least at moderate risk for complications without adequate follow up.  This format is felt to be most appropriate for this patient at this time.  All issues noted in this document were discussed and addressed.  A limited physical exam was performed with this format.    This visit type was conducted due to national recommendations for restrictions regarding the COVID-19 Pandemic (e.g. social distancing) in an effort to limit this patient's exposure and mitigate transmission in our community.  Patients identity confirmed using two different identifiers.  This format is felt to be most appropriate for this patient at this time.  All issues noted in this document were discussed and addressed.  No physical exam was performed (except for noted visual exam findings with Video Visits).    Date:  07/25/2019   ID:  [name], [name] 14-Feb-1946, MRN [id number]  Patient Location:  Home - spoke with Nicole Oconnor  Provider location:   Office    Chief Complaint:  Hypertension  History of Present Illness:    Nicole Oconnor is a 73 y.o. female who presents via video conferencing for a telehealth visit today.    The patient does not have symptoms concerning for COVID-19 infection (fever, chills, cough, or new shortness of breath).   Hypertension This is a chronic problem. The current episode started more than 1 year ago. The problem is unchanged. The problem is controlled. Pertinent negatives include no anxiety. There are no associated agents to hypertension. Risk factors for coronary artery disease include sedentary lifestyle. Past treatments include angiotensin blockers and diuretics. There are no  compliance problems.  There is no history of angina. There is no history of chronic renal disease.  Knee Pain  The incident occurred more than 1 week ago. There was no injury mechanism. The quality of the pain is described as aching. Pertinent negatives include no tingling.     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   aspirin 81 MG tablet Take 81 mg by mouth daily.   atenolol (TENORMIN) 50 MG tablet TAKE 1 TABLET BY MOUTH EVERY DAY   loratadine (CLARITIN) 10 MG tablet TAKE 1 TABLET BY MOUTH EVERY DAY   olmesartan-hydrochlorothiazide (BENICAR HCT) 40-12.5 MG tablet Take 1 tablet by mouth daily.     Allergies:   Penicillins   Social History   Tobacco Use   Smoking status: Never Smoker   Smokeless tobacco: Never Used  Substance Use Topics   Alcohol use: Not Currently    Comment: occasional wine   Drug use: No     Family Hx: The patient's family history includes Breast cancer in her sister.  ROS:   Please see the  history of present illness.    Review of Systems  Constitutional: Negative.   Respiratory: Negative.   Cardiovascular: Negative.   Neurological: Negative.  Negative for dizziness and tingling.    All other systems reviewed and are negative.   Labs/Other Tests and Data Reviewed:    Recent Labs: No results found for requested labs within last 8760 hours.   Recent Lipid Panel No results found for: CHOL, TRIG, HDL, CHOLHDL, LDLCALC, LDLDIRECT  Wt Readings from Last 3 Encounters:  12/30/18 245 lb (111.1 kg)  12/30/18 245 lb  (111.1 kg)  03/03/18 245 lb 6.4 oz (111.3 kg)     Exam:    Vital Signs:  BP 124/64 Comment: virtual visit   Pulse (!) 55 Comment: virtual vistit   Temp (!) 97.5 F (36.4 C) Comment: virtual appt    Physical Exam  Constitutional: She is oriented to person, place, and time and well-developed, well-nourished, and in no distress. No distress.  Neurological: She is alert and oriented to person, place, and time.  Psychiatric: Mood, memory, affect and judgment normal.    ASSESSMENT & PLAN:    1. Essential hypertension  Chronic, good control  Continue with current medications  She is going to come in the next couple weeks for her labs.  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 Chronic pain of right knee  Intermittent right knee pain  She is not interested in a pain cream at this time.    COVID-19 Education: The signs and symptoms of COVID-19 were discussed with the patient and how to seek care for testing (follow up with PCP or arrange E-visit).  The importance of social distancing was discussed today.  Patient Risk:   After full review of this patients clinical status, I feel that they are at least moderate risk at this time.  Time:   Today, I have spent 12 minutes/ seconds with the patient with telehealth technology discussing above diagnoses.     Medication Adjustments/Labs and Tests Ordered: Current medicines are reviewed at length with the patient today.  Concerns regarding medicines are outlined above.   Tests Ordered: Orders Placed This Encounter  Procedures   CMP14+EGFR   CBC no Diff    Medication Changes: No orders of the defined types were placed in this encounter.   Disposition:  Follow up labs in 2 weeks  Signed, Minette Brine, FNP

## 2019-07-25 ENCOUNTER — Encounter: Payer: Self-pay | Admitting: Nurse Practitioner

## 2019-08-03 ENCOUNTER — Other Ambulatory Visit: Payer: Medicare Other

## 2019-09-07 ENCOUNTER — Telehealth: Payer: Self-pay

## 2019-10-17 ENCOUNTER — Telehealth: Payer: Self-pay

## 2019-10-17 ENCOUNTER — Ambulatory Visit: Payer: Self-pay

## 2019-10-17 DIAGNOSIS — I10 Essential (primary) hypertension: Secondary | ICD-10-CM

## 2019-10-17 DIAGNOSIS — M25561 Pain in right knee: Secondary | ICD-10-CM

## 2019-10-17 DIAGNOSIS — G8929 Other chronic pain: Secondary | ICD-10-CM

## 2019-10-17 DIAGNOSIS — M1A9XX0 Chronic gout, unspecified, without tophus (tophi): Secondary | ICD-10-CM

## 2019-10-18 ENCOUNTER — Other Ambulatory Visit: Payer: Self-pay

## 2019-10-18 ENCOUNTER — Telehealth: Payer: Medicare Other

## 2019-10-18 NOTE — Chronic Care Management (AMB) (Signed)
  Chronic Care Management   Outreach Note  10/18/2019 Name: Nicole Oconnor MRN: [id number] DOB: 1946-07-16  Referred by: [name], MD Reason for referral : Chronic Care Management (RQ Initial Call - HTN, Chronic gout, Chronic Rt knee pain)   An unsuccessful telephone outreach was attempted today. The patient was referred to the case management team for assistance with care management and care coordination.   Follow Up Plan: Telephone follow up appointment with care management team member scheduled for: 11/09/19  Barb Merino, RN, BSN, CCM Care Management Coordinator [place] [facility]  Direct Phone: [phone]

## 2019-11-09 ENCOUNTER — Ambulatory Visit (INDEPENDENT_AMBULATORY_CARE_PROVIDER_SITE_OTHER): Payer: Medicare Other

## 2019-11-09 ENCOUNTER — Telehealth: Payer: Medicare Other

## 2019-11-09 ENCOUNTER — Other Ambulatory Visit: Payer: Self-pay

## 2019-11-09 DIAGNOSIS — M1A9XX0 Chronic gout, unspecified, without tophus (tophi): Secondary | ICD-10-CM

## 2019-11-09 DIAGNOSIS — G8929 Other chronic pain: Secondary | ICD-10-CM

## 2019-11-09 DIAGNOSIS — I10 Essential (primary) hypertension: Secondary | ICD-10-CM

## 2019-11-09 DIAGNOSIS — M25561 Pain in right knee: Secondary | ICD-10-CM

## 2019-11-15 ENCOUNTER — Ambulatory Visit: Payer: Self-pay

## 2019-11-15 DIAGNOSIS — I10 Essential (primary) hypertension: Secondary | ICD-10-CM

## 2019-11-15 NOTE — Chronic Care Management (AMB) (Signed)
  Chronic Care Management   Outreach Note  11/15/2019 Name: Nicole Oconnor MRN: [id number] DOB: 07/01/1946  Referred by: [name], MD Reason for referral : Care Coordination   SW placed an unsuccessful outbound call to the patient to conduct and SDOH screen. SW left a HIPAA compliant voice message requesting a return call.  Follow Up Plan: The care management team will reach out to the patient again over the next 14 days.   Daneen Schick, BSW, CDP Social Worker, Certified Dementia Practitioner [place] [facility] [place] [facility] [phone]

## 2019-11-15 NOTE — Chronic Care Management (AMB) (Signed)
Chronic Care Management   Initial Visit Note  11/10/2019 Name: Nicole Oconnor MRN: [id number] DOB: [birth year]  Referred by: [name], MD Reason for referral : Chronic Care Management (RQ #2 Initial Call - HTN/Gout/Chronic pain )   Nicole Oconnor is a 74 y.o. year old female who is a primary care patient of [name], MD. The CCM team was consulted for assistance with chronic disease management and care coordination needs related to HTN and Chronic Gout, Chronic Knee Pain   Review of patient status, including review of consultants reports, relevant laboratory and other test results, and collaboration with appropriate care team members and the patient's provider was performed as part of comprehensive patient evaluation and provision of chronic care management services.    SDOH (Social Determinants of Health) assessments performed: No See Care Plan activities for detailed interventions related to [place] outbound initial CCM RN CM call to patient to assess for CCM needs.    Medications: Outpatient Encounter Medications as of 11/09/2019  Medication Sig  . allopurinol (ZYLOPRIM) 300 MG tablet Take 1 tablet (300 mg total) by mouth daily.  Marland Kitchen aspirin 81 MG tablet Take 81 mg by mouth daily.  Marland Kitchen atenolol (TENORMIN) 50 MG tablet TAKE 1 TABLET BY MOUTH EVERY DAY  . loratadine (CLARITIN) 10 MG tablet TAKE 1 TABLET BY MOUTH EVERY DAY  . olmesartan-hydrochlorothiazide (BENICAR HCT) 40-12.5 MG tablet Take 1 tablet by mouth daily.   No facility-administered encounter medications on file as of 11/09/2019.     Objective:  No results found for: HGBA1C Lab Results  Component Value Date   CREATININE 0.8 03/03/2018   BP Readings from Last 3 Encounters:  07/20/19 124/64  12/30/18 (!) 124/57  12/30/18 (!) 124/57    Goals Addressed      Patient Stated   . "to keep my BP in good control" (pt-stated)       CARE PLAN ENTRY (see longtitudinal plan of care for additional care plan  information)  Current Barriers:  Marland Kitchen Knowledge Deficits related to disease process and Self Health management of HTN . Chronic Disease Management support and education needs related to HTN, Chronic gout without tophus, Chronic right knee pain  Nurse Case Manager Clinical Goal(s):  Marland Kitchen Over the next 90 days, patient will work with the CCM team and PCP to address needs related to disease education and support to improve Self Health management of HTN  CCM RN CM Interventions:  11/10/19 call completed with patient  . Determined patient would like to keep this call brief due to caring for her spouse at this time . Evaluation of current treatment plan related to HTN and patient's adherence to plan as established by provider. . Discussed plans with patient for ongoing care management follow up and provided patient with direct contact information for care management team . Provided patient with printed educational materials related to What is High Blood Pressure?; African Americans and High Blood Pressure; Why Should I Lower Sodium; High Blood and Stroke; Life's Simple 7 . Discussed plans with patient for ongoing care management follow up and provided patient with direct contact information for care management team  Patient Self Care Activities:  . Patient verbalizes understanding of plan to review patient educational materials for discussion during next CCM RN Cm follow up call  . Attends all scheduled provider appointments . Calls pharmacy for medication refills . Performs ADL's independently . Calls provider office for new concerns or questions  Initial goal documentation  Plan:   Telephone follow up appointment with care management team member scheduled for: 12/01/19  Barb Merino, RN, BSN, CCM Care Management Coordinator [place] [facility]  Direct Phone: [phone]

## 2019-11-23 ENCOUNTER — Ambulatory Visit: Payer: Medicare Other

## 2019-11-23 DIAGNOSIS — I10 Essential (primary) hypertension: Secondary | ICD-10-CM

## 2019-11-23 NOTE — Chronic Care Management (AMB) (Signed)
  Chronic Care Management    Social Work CCM Outreach Note  11/23/2019 Name: Nicole Oconnor MRN: [id number] DOB: 28-Apr-1946  Nicole Oconnor is a 74 y.o. year old female who is a primary care patient of [name], MD . The CCM team was consulted for assistance with care coordination.    SW placed a successful outbound call to the patient to conduct a SW assessment.  SDOH (Social Determinants of Health) assessments performed: Yes. No challenges noted at this time.  Follow Up Plan: No SW follow up planned at this time. The patient is encouraged to contact SW with future resource needs. The patient will remain active with RN Case Manager.  Daneen Schick, BSW, CDP Social Worker, Certified Dementia Practitioner [place] [facility] [place] [facility] [phone]

## 2019-12-01 ENCOUNTER — Telehealth: Payer: Self-pay

## 2019-12-30 ENCOUNTER — Other Ambulatory Visit: Payer: Self-pay | Admitting: Nurse Practitioner

## 2019-12-30 DIAGNOSIS — I10 Essential (primary) hypertension: Secondary | ICD-10-CM

## 2019-12-31 ENCOUNTER — Other Ambulatory Visit: Payer: Self-pay | Admitting: Nurse Practitioner

## 2019-12-31 DIAGNOSIS — M1A9XX0 Chronic gout, unspecified, without tophus (tophi): Secondary | ICD-10-CM

## 2020-01-04 ENCOUNTER — Ambulatory Visit: Payer: Medicare Other | Admitting: Nurse Practitioner

## 2020-01-04 ENCOUNTER — Ambulatory Visit: Payer: Medicare Other

## 2020-01-04 ENCOUNTER — Encounter: Payer: Medicare Other | Admitting: Nurse Practitioner

## 2020-01-16 ENCOUNTER — Telehealth: Payer: Self-pay

## 2020-03-01 ENCOUNTER — Telehealth: Payer: Self-pay

## 2020-04-12 ENCOUNTER — Telehealth: Payer: Medicare Other

## 2020-04-12 ENCOUNTER — Telehealth: Payer: Self-pay

## 2020-04-12 NOTE — Telephone Encounter (Cosign Needed)
  Chronic Care Management   Outreach Note  04/12/2020 Name: Nicole Oconnor MRN: [id number] DOB: 27-May-1946  Referred by: [name], MD Reason for referral : No chief complaint on file.   An unsuccessful telephone outreach was attempted today. The patient was referred to the case management team for assistance with care management and care coordination.   Follow Up Plan: A HIPPA compliant phone message was left for the patient providing contact information and requesting a return call.  Telephone follow up appointment with care management team member scheduled for: 05/18/20  Barb Merino, RN, BSN, CCM Care Management Coordinator [place] [facility]  Direct Phone: [phone]

## 2020-04-13 DIAGNOSIS — H35363 Drusen (degenerative) of macula, bilateral: Secondary | ICD-10-CM | POA: Diagnosis not present

## 2020-04-13 DIAGNOSIS — H2513 Age-related nuclear cataract, bilateral: Secondary | ICD-10-CM | POA: Diagnosis not present

## 2020-04-13 DIAGNOSIS — H04123 Dry eye syndrome of bilateral lacrimal glands: Secondary | ICD-10-CM | POA: Diagnosis not present

## 2020-05-18 ENCOUNTER — Telehealth: Payer: Medicare Other

## 2020-05-18 ENCOUNTER — Telehealth: Payer: Self-pay

## 2020-05-18 NOTE — Telephone Encounter (Cosign Needed)
  Chronic Care Management   Outreach Note  05/18/2020 Name: Nicole Oconnor MRN: [id number] DOB: 04-08-1946  Referred by: [name], MD Reason for referral : Chronic Care Management (FU RN CM Call )   A second unsuccessful telephone outreach was attempted today. The patient was referred to the case management team for assistance with care management and care coordination.   Follow Up Plan: Telephone follow up appointment with care management team member scheduled for: 06/26/20  Barb Merino, RN, BSN, CCM Care Management Coordinator [place] [facility]  Direct Phone: [phone]

## 2020-06-04 ENCOUNTER — Telehealth: Payer: Self-pay | Admitting: Internal Medicine

## 2020-06-04 NOTE — Telephone Encounter (Signed)
I left a message asking the pt to call and schedule AWV w/ [place].

## 2020-06-14 ENCOUNTER — Ambulatory Visit (INDEPENDENT_AMBULATORY_CARE_PROVIDER_SITE_OTHER): Payer: Medicare Other

## 2020-06-14 VITALS — BP 128/57 | HR 56 | Temp 97.1°F | Ht 64.0 in | Wt 175.0 lb

## 2020-06-14 DIAGNOSIS — Z0000 Encounter for general adult medical examination without abnormal findings: Secondary | ICD-10-CM | POA: Diagnosis not present

## 2020-06-14 NOTE — Patient Instructions (Signed)
Nicole Oconnor , Thank you for taking time to come for your Medicare Wellness Visit. I appreciate your ongoing commitment to your health goals. Please review the following plan we discussed and let me know if I can assist you in the future.   Screening recommendations/referrals: Colonoscopy: completed 12/03/2010 Mammogram: not required Bone Density: completed 05/13/2017 Recommended yearly ophthalmology/optometry visit for glaucoma screening and checkup Recommended yearly dental visit for hygiene and checkup  Vaccinations: Influenza vaccine: completed 05/17/2020 Pneumococcal vaccine: up to date per patient Tdap vaccine: up to date per patient Shingles vaccine:  completed   Covid-19: 12/21/2019, 01/11/2020  Advanced directives: Please bring a copy of your POA (Power of Attorney) and/or Living Will to your next appointment.   Conditions/risks identified: none  Next appointment: Follow up in one year for your annual wellness visit    Preventive Care 65 Years and Older, Female Preventive care refers to lifestyle choices and visits with your health care provider that can promote health and wellness. What does preventive care include?  A yearly physical exam. This is also called an annual well check.  Dental exams once or twice a year.  Routine eye exams. Ask your health care provider how often you should have your eyes checked.  Personal lifestyle choices, including:  Daily care of your teeth and gums.  Regular physical activity.  Eating a healthy diet.  Avoiding tobacco and drug use.  Limiting alcohol use.  Practicing safe sex.  Taking low-dose aspirin every day.  Taking vitamin and mineral supplements as recommended by your health care provider. What happens during an annual well check? The services and screenings done by your health care provider during your annual well check will depend on your age, overall health, lifestyle risk factors, and family history of  disease. Counseling  Your health care provider may ask you questions about your:  Alcohol use.  Tobacco use.  Drug use.  Emotional well-being.  Home and relationship well-being.  Sexual activity.  Eating habits.  History of falls.  Memory and ability to understand (cognition).  Work and work Statistician.  Reproductive health. Screening  You may have the following tests or measurements:  Height, weight, and BMI.  Blood pressure.  Lipid and cholesterol levels. These may be checked every 5 years, or more frequently if you are over 50 years old.  Skin check.  Lung cancer screening. You may have this screening every year starting at age 43 if you have a 30-pack-year history of smoking and currently smoke or have quit within the past 15 years.  Fecal occult blood test (FOBT) of the stool. You may have this test every year starting at age 47.  Flexible sigmoidoscopy or colonoscopy. You may have a sigmoidoscopy every 5 years or a colonoscopy every 10 years starting at age 13.  Hepatitis C blood test.  Hepatitis B blood test.  Sexually transmitted disease (STD) testing.  Diabetes screening. This is done by checking your blood sugar (glucose) after you have not eaten for a while (fasting). You may have this done every 1-3 years.  Bone density scan. This is done to screen for osteoporosis. You may have this done starting at age 57.  Mammogram. This may be done every 1-2 years. Talk to your health care provider about how often you should have regular mammograms. Talk with your health care provider about your test results, treatment options, and if necessary, the need for more tests. Vaccines  Your health care provider may recommend certain vaccines, such as:  Influenza vaccine. This is recommended every year.  Tetanus, diphtheria, and acellular pertussis (Tdap, Td) vaccine. You may need a Td booster every 10 years.  Zoster vaccine. You may need this after age  18.  Pneumococcal 13-valent conjugate (PCV13) vaccine. One dose is recommended after age 71.  Pneumococcal polysaccharide (PPSV23) vaccine. One dose is recommended after age 54. Talk to your health care provider about which screenings and vaccines you need and how often you need them. This information is not intended to replace advice given to you by your health care provider. Make sure you discuss any questions you have with your health care provider. Document Released: 09/07/2015 Document Revised: 04/30/2016 Document Reviewed: 06/12/2015 Elsevier Interactive Patient Education  2017 [place] Prevention in the Home Falls can cause injuries. They can happen to people of all ages. There are many things you can do to make your home safe and to help prevent falls. What can I do on the outside of my home?  Regularly fix the edges of walkways and driveways and fix any cracks.  Remove anything that might make you trip as you walk through a door, such as a raised step or threshold.  Trim any bushes or trees on the path to your home.  Use bright outdoor lighting.  Clear any walking paths of anything that might make someone trip, such as rocks or tools.  Regularly check to see if handrails are loose or broken. Make sure that both sides of any steps have handrails.  Any raised decks and porches should have guardrails on the edges.  Have any leaves, snow, or ice cleared regularly.  Use sand or salt on walking paths during winter.  Clean up any spills in your garage right away. This includes oil or grease spills. What can I do in the bathroom?  Use night lights.  Install grab bars by the toilet and in the tub and shower. Do not use towel bars as grab bars.  Use non-skid mats or decals in the tub or shower.  If you need to sit down in the shower, use a plastic, non-slip stool.  Keep the floor dry. Clean up any water that spills on the floor as soon as it happens.  Remove  soap buildup in the tub or shower regularly.  Attach bath mats securely with double-sided non-slip rug tape.  Do not have throw rugs and other things on the floor that can make you trip. What can I do in the bedroom?  Use night lights.  Make sure that you have a light by your bed that is easy to reach.  Do not use any sheets or blankets that are too big for your bed. They should not hang down onto the floor.  Have a firm chair that has side arms. You can use this for support while you get dressed.  Do not have throw rugs and other things on the floor that can make you trip. What can I do in the kitchen?  Clean up any spills right away.  Avoid walking on wet floors.  Keep items that you use a lot in easy-to-reach places.  If you need to reach something above you, use a strong step stool that has a grab bar.  Keep electrical cords out of the way.  Do not use floor polish or wax that makes floors slippery. If you must use wax, use non-skid floor wax.  Do not have throw rugs and other things on the floor that  can make you trip. What can I do with my stairs?  Do not leave any items on the stairs.  Make sure that there are handrails on both sides of the stairs and use them. Fix handrails that are broken or loose. Make sure that handrails are as long as the stairways.  Check any carpeting to make sure that it is firmly attached to the stairs. Fix any carpet that is loose or worn.  Avoid having throw rugs at the top or bottom of the stairs. If you do have throw rugs, attach them to the floor with carpet tape.  Make sure that you have a light switch at the top of the stairs and the bottom of the stairs. If you do not have them, ask someone to add them for you. What else can I do to help prevent falls?  Wear shoes that:  Do not have high heels.  Have rubber bottoms.  Are comfortable and fit you well.  Are closed at the toe. Do not wear sandals.  If you use a  stepladder:  Make sure that it is fully opened. Do not climb a closed stepladder.  Make sure that both sides of the stepladder are locked into place.  Ask someone to hold it for you, if possible.  Clearly mark and make sure that you can see:  Any grab bars or handrails.  First and last steps.  Where the edge of each step is.  Use tools that help you move around (mobility aids) if they are needed. These include:  Canes.  Walkers.  Scooters.  Crutches.  Turn on the lights when you go into a dark area. Replace any light bulbs as soon as they burn out.  Set up your furniture so you have a clear path. Avoid moving your furniture around.  If any of your floors are uneven, fix them.  If there are any pets around you, be aware of where they are.  Review your medicines with your doctor. Some medicines can make you feel dizzy. This can increase your chance of falling. Ask your doctor what other things that you can do to help prevent falls. This information is not intended to replace advice given to you by your health care provider. Make sure you discuss any questions you have with your health care provider. Document Released: 06/07/2009 Document Revised: 01/17/2016 Document Reviewed: 09/15/2014 Elsevier Interactive Patient Education  2017 [employer].

## 2020-06-14 NOTE — Progress Notes (Signed)
I connected with [name] today by telephone and verified that I am speaking with the correct person using two identifiers. Location patient: home Location provider: work Persons participating in the virtual visit: Mardell Shanell, [place] LPN.   I discussed the limitations, risks, security and privacy concerns of performing an evaluation and management service by telephone and the availability of in person appointments. I also discussed with the patient that there may be a patient responsible charge related to this service. The patient expressed understanding and verbally consented to this telephonic visit.    Interactive audio and video telecommunications were attempted between this provider and patient, however failed, due to patient having technical difficulties OR patient did not have access to video capability.  We continued and completed visit with audio only.     Vital signs may be patient reported or missing.  Subjective:   Nicole Oconnor Oconnor is a 74 y.o. female who presents for Medicare Annual (Subsequent) preventive examination.  Review of Systems     Cardiac Risk Factors include: advanced age (>37men, >31 women);hypertension;obesity (BMI >30kg/m2);sedentary lifestyle     Objective:    Today's Vitals   06/14/20 1526 06/14/20 1529  BP: (!) 128/57   Pulse: (!) 56   Temp: (!) 97.1 F (36.2 C)   Weight: 175 lb (79.4 kg)   Height: 5\' 4"  (1.626 m)   PainSc:  8    Body mass index is 30.04 kg/m.  Advanced Directives 06/14/2020 12/30/2018  Does Patient Have a Medical Advance Directive? Yes Yes  Type of Paramedic of [place];Living will [place];Living will  Copy of [place] in Chart? No - copy requested No - copy requested    Current Medications (verified) Outpatient Encounter Medications as of 06/14/2020  Medication Sig  . allopurinol (ZYLOPRIM) 300 MG tablet TAKE 1 TABLET BY MOUTH EVERY DAY  .  aspirin 81 MG tablet Take 81 mg by mouth daily.  Marland Kitchen atenolol (TENORMIN) 50 MG tablet TAKE 1 TABLET BY MOUTH EVERY DAY  . loratadine (CLARITIN) 10 MG tablet TAKE 1 TABLET BY MOUTH EVERY DAY  . olmesartan-hydrochlorothiazide (BENICAR HCT) 40-12.5 MG tablet TAKE 1 TABLET BY MOUTH EVERY DAY   No facility-administered encounter medications on file as of 06/14/2020.    Allergies (verified) Penicillins   History: Past Medical History:  Diagnosis Date  . Headache(784.0)    Frequently  . History of measles, mumps, or rubella   . Hypertension   . Leaking of urine   . Yeast infection    Past Surgical History:  Procedure Laterality Date  . ABDOMINAL HYSTERECTOMY  1999  . 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 REPLACEMENT TOTAL KNEE BILATERAL    . TUBAL LIGATION     Family History  Problem Relation Age of Onset  . Breast cancer Sister    Social History   Socioeconomic History  . Marital status: Married    Spouse name: Not on file  . Number of children: Not on file  . Years of education: Not on file  . Highest education level: Not on file  Occupational History  . Occupation: retired  Tobacco Use  . Smoking status: Never Smoker  . Smokeless tobacco: Never Used  Vaping Use  . Vaping Use: Never used  Substance and Sexual Activity  . Alcohol use: Not Currently    Comment: occasional wine  . Drug use: No  . Sexual activity: Not Currently  Other Topics Concern  .  Not on file  Social History Narrative  . Not on file   Social Determinants of Health   Financial Resource Strain: Low Risk   . Difficulty of Paying Living Expenses: Not hard at all  Food Insecurity: No Food Insecurity  . Worried About Charity fundraiser in the Last Year: Never true  . Ran Out of Food in the Last Year: Never true  Transportation Needs: No Transportation Needs  . Lack of Transportation (Medical): No  . Lack of Transportation (Non-Medical): No  Physical Activity: Inactive  . Days of Exercise per Week: 0  days  . Minutes of Exercise per Session: 0 min  Stress: No Stress Concern Present  . Feeling of Stress : Not at all  Social Connections:   . Frequency of Communication with Friends and Family: Not on file  . Frequency of Social Gatherings with Friends and Family: Not on file  . Attends Religious Services: Not on file  . Active Member of Clubs or Organizations: Not on file  . Attends Archivist Meetings: Not on file  . Marital Status: Not on file    Tobacco Counseling Counseling given: Not Answered   Clinical Intake:  Pre-visit preparation completed: Yes  Pain : 0-10 Pain Score: 8  Pain Type: Chronic pain Pain Location: Knee Pain Orientation: Right Pain Radiating Towards: none Pain Descriptors / Indicators: Aching Pain Onset: More than a month ago Pain Frequency: Constant Pain Relieving Factors: stretching it helps, APAP  Pain Relieving Factors: stretching it helps, APAP  Nutritional Status: BMI > 30  Obese Nutritional Risks: None Diabetes: No  How often do you need to have someone help you when you read instructions, pamphlets, or other written materials from your doctor or pharmacy?: 1 - Never What is the last grade level you completed in school?: master's degree  Diabetic? no  Interpreter Needed?: No  Information entered by :: NAllen LPN   Activities of Daily Living In your present state of health, do you have any difficulty performing the following activities: 06/14/2020  Hearing? N  Vision? N  Difficulty concentrating or making decisions? N  Walking or climbing stairs? Y  Comment due to knees  Dressing or bathing? N  Doing errands, shopping? Y  Comment has someone drive her  Preparing Food and eating ? N  Using the Toilet? N  In the past six months, have you accidently leaked urine? Y  Comment if held too long  Do you have problems with loss of bowel control? N  Managing your Medications? N  Managing your Finances? N  Housekeeping or  managing your Housekeeping? N  Some recent data might be hidden    Patient Care Team: [name], MD as PCP - General (Internal Medicine) Rex Kras, Claudette Stapler, RN as [place] any recent [place] you may have received from other than [facility] providers in the past year (date may be approximate).     Assessment:   This is a routine wellness examination for Nicole Oconnor Oconnor.  Hearing/Vision screen  Hearing Screening   125Hz  250Hz  500Hz  1000Hz  2000Hz  3000Hz  4000Hz  6000Hz  8000Hz   Right ear:           Left ear:           Vision Screening Comments: Regular eye  exams, [facility]  Dietary issues and exercise activities discussed: Current Exercise Habits: The patient does not participate in regular exercise at present  Goals    .  "to  keep my BP in good control" (pt-stated)      CARE PLAN ENTRY (see longtitudinal plan of care for additional care plan information)  Current Barriers:  Marland Kitchen Knowledge Deficits related to disease process and Self Health management of HTN . Chronic Disease Management support and education needs related to HTN, Chronic gout without tophus, Chronic right knee pain  Nurse Case Manager Clinical Goal(s):  Marland Kitchen Over the next 90 days, patient will work with the CCM team and PCP to address needs related to disease education and support to improve Self Health management of HTN  CCM RN CM Interventions:  11/10/19 call completed with patient  . Determined patient would like to keep this call brief due to caring for her spouse at this time . Evaluation of current treatment plan related to HTN and patient's adherence to plan as established by provider. . Discussed plans with patient for ongoing care management follow up and provided patient with direct contact information for care management team . Provided patient with printed educational materials related to What is High Blood Pressure?; African Americans and High Blood Pressure;  Why Should I Lower Sodium; High Blood and Stroke; Life's Simple 7 . Discussed plans with patient for ongoing care management follow up and provided patient with direct contact information for care management team  Patient Self Care Activities:  . Patient verbalizes understanding of plan to review patient educational materials for discussion during next CCM RN Cm follow up call  . Attends all scheduled provider appointments . Calls pharmacy for medication refills . Performs ADL's independently . Calls provider office for new concerns or questions  Initial goal documentation     .  Patient Stated (pt-stated)      Wants to start walking    .  Patient Stated      06/14/2020, wants to get knee better      Depression Screen PHQ 2/9 Scores 06/14/2020 12/30/2018  PHQ - 2 Score 0 0  PHQ- 9 Score - 0    Fall Risk Fall Risk  06/14/2020 12/30/2018  Falls in the past year? 0 0  Risk for fall due to : Medication side effect Medication side effect  Follow up Falls evaluation completed;Education provided;Falls prevention discussed Falls prevention discussed    Any stairs in or around the home? Yes  If so, are there any without handrails? No  Home free of loose throw rugs in walkways, pet beds, electrical cords, etc? Yes  Adequate lighting in your home to reduce risk of falls? Yes   ASSISTIVE DEVICES UTILIZED TO PREVENT FALLS:  Life alert? No  Use of a cane, walker or w/c? Yes  Grab bars in the bathroom? No  Shower chair or bench in shower? Yes  Elevated toilet seat or a handicapped toilet? Yes   TIMED UP AND GO:  Was the test performed? No . c.    Cognitive Function:     6CIT Screen 06/14/2020 12/30/2018  What Year? 0 points 0 points  What month? 0 points 0 points  What time? 0 points 0 points  Count back from 20 2 points 0 points  Months in reverse 0 points 0 points  Repeat phrase 0 points 0 points  Total Score 2 0    Immunizations Immunization History  Administered  Date(s) Administered  . Influenza-Unspecified 04/25/2013, 05/25/2014, 05/17/2020  . Moderna SARS-COVID-2 Vaccination 12/21/2019, 01/11/2020  . Zoster Recombinat (Shingrix) 05/17/2020    TDAP status: Due, Education has been provided regarding the importance of this  vaccine. Advised may receive this vaccine at local pharmacy or Health Dept. Aware to provide a copy of the vaccination record if obtained from local pharmacy or Health Dept. Verbalized acceptance and understanding. Flu Vaccine status: Up to date Pneumococcal vaccine status: Up to date Covid-19 vaccine status: Completed vaccines  Qualifies for Shingles Vaccine? Yes   Zostavax completed No   Shingrix Completed?: Yes  Screening Tests Health Maintenance  Topic Date Due  . TETANUS/TDAP  Never done  . PNA vac Low Risk Adult (1 of 2 - PCV13) Never done  . COLONOSCOPY  12/02/2020  . INFLUENZA VACCINE  Completed  . DEXA SCAN  Completed  . COVID-19 Vaccine  Completed  . Hepatitis C Screening  Completed    Health Maintenance  Health Maintenance Due  Topic Date Due  . TETANUS/TDAP  Never done  . PNA vac Low Risk Adult (1 of 2 - PCV13) Never done    Colorectal cancer screening: Completed 12/03/2010 . Repeat every 10 years Mammogram status: No longer required.  Bone Density status: Completed 05/13/2017.   Lung Cancer Screening: (Low Dose CT Chest recommended if Age 32-80 years, 30 pack-year currently smoking OR have quit w/in 15years.) does not qualify.   Lung Cancer Screening Referral: no   Additional Screening:  Hepatitis C Screening: does qualify; Completed 02/17/2013  Vision Screening: Recommended annual ophthalmology exams for early detection of glaucoma and other disorders of the eye. Is the patient up to date with their annual eye exam?  Yes  Who is the provider or what is the name of the office in which the patient attends annual eye exams? [facility] If pt is not established with a provider, would they  like to be referred to a provider to establish care? No .   Dental Screening: Recommended annual dental exams for proper oral hygiene  Community Resource Referral / Chronic Care Management: CRR required this visit?  No   CCM required this visit?  No      Plan:     I have personally reviewed and noted the following in the patient's chart:   . Medical and social history . Use of alcohol, tobacco or illicit drugs  . Current medications and supplements . Functional ability and status . Nutritional status . Physical activity . Advanced directives . List of other physicians . Hospitalizations, surgeries, and ER visits in previous 12 months . Vitals . Screenings to include cognitive, depression, and falls . Referrals and appointments  In addition, I have reviewed and discussed with patient certain preventive protocols, quality metrics, and best practice recommendations. A written personalized care plan for preventive services as well as general preventive health recommendations were provided to patient.     Kellie Simmering, LPN   04/54/0981   Nurse Notes:

## 2020-06-26 ENCOUNTER — Other Ambulatory Visit: Payer: Self-pay

## 2020-06-26 ENCOUNTER — Ambulatory Visit: Payer: Self-pay

## 2020-06-26 ENCOUNTER — Telehealth: Payer: Medicare Other

## 2020-06-26 DIAGNOSIS — M1A9XX0 Chronic gout, unspecified, without tophus (tophi): Secondary | ICD-10-CM

## 2020-06-26 DIAGNOSIS — G8929 Other chronic pain: Secondary | ICD-10-CM

## 2020-06-26 DIAGNOSIS — I10 Essential (primary) hypertension: Secondary | ICD-10-CM

## 2020-06-26 MED ORDER — ATENOLOL 50 MG PO TABS: 50.0000 mg | ORAL_TABLET | Freq: Every day | ORAL | 1 refills | Status: DC

## 2020-06-26 MED ORDER — OLMESARTAN MEDOXOMIL-HCTZ 40-12.5 MG PO TABS: 1.0000 | ORAL_TABLET | Freq: Every day | ORAL | 1 refills | Status: DC

## 2020-06-26 MED ORDER — ALLOPURINOL 300 MG PO TABS: 300.0000 mg | ORAL_TABLET | Freq: Every day | ORAL | 1 refills | Status: DC

## 2020-06-27 ENCOUNTER — Telehealth: Payer: Self-pay

## 2020-06-27 NOTE — Telephone Encounter (Signed)
I left pt v/m to call the office we wanted to see if there was something we can help her with or if something was going on that she has been unable to come into the office for the past year. YL,RMA

## 2020-06-27 NOTE — Chronic Care Management (AMB) (Signed)
Chronic Care Management   Follow Up Note   06/26/2020 Name: Nicole Oconnor MRN: [id number] DOB: September 09, 1945  Referred by: [name], MD Reason for referral : Chronic Care Management (CCM RNCM FU Call )   Nicole Oconnor is a 74 y.o. year old female who is a primary care patient of [name], MD. The CCM team was consulted for assistance with chronic disease management and care coordination needs.    Review of patient status, including review of consultants reports, relevant laboratory and other test results, and collaboration with appropriate care team members and the patient's provider was performed as part of comprehensive patient evaluation and provision of chronic care management services.    SDOH (Social Determinants of Health) assessments performed: Yes - transportation - embedded BSW will f/u with patient  See Care Plan activities for detailed interventions related to [facility])     Outpatient Encounter Medications as of 06/26/2020  Medication Sig  . allopurinol (ZYLOPRIM) 300 MG tablet Take 1 tablet (300 mg total) by mouth daily.  Marland Kitchen aspirin 81 MG tablet Take 81 mg by mouth daily.  Marland Kitchen atenolol (TENORMIN) 50 MG tablet Take 1 tablet (50 mg total) by mouth daily.  Marland Kitchen loratadine (CLARITIN) 10 MG tablet TAKE 1 TABLET BY MOUTH EVERY DAY  . olmesartan-hydrochlorothiazide (BENICAR HCT) 40-12.5 MG tablet Take 1 tablet by mouth daily.   No facility-administered encounter medications on file as of 06/26/2020.     Objective:  No results found for: HGBA1C Lab Results  Component Value Date   CREATININE 0.8 03/03/2018   BP Readings from Last 3 Encounters:  06/14/20 (!) 128/57  07/20/19 124/64  12/30/18 (!) 124/57    Goals Addressed      Patient Stated   .  "to improve my knee pain" (pt-stated)        [place] (see longitudinal plan of care for additional care plan information)  Current Barriers:  Marland Kitchen Knowledge Deficits related to disease process and Self Health  management of chronic right knee pain  . Chronic Disease Management support and education needs related to HTN, Chronic gout without tophus, unspecified cause, unspecified site, Chronic right knee pain  . Transportation barriers  Nurse Case Manager Clinical Goal(s):  Marland Kitchen Over the next 180 days, patient will work with the CCM team and PCP to address needs related to disease education and support for improved Self Health management of chronic right knee pain   CCM RN CM Interventions:  06/26/20 call completed with patient  . Inter-disciplinary care team collaboration (see longitudinal plan of care) . Evaluation of current treatment plan related to chronic right knee pain and patient's adherence to plan as established by provider. . Reviewed medications with patient and discussed patient is taking Allopurinol 300 mg qd for chronic gout   . Determined patient has had a past right knee replacement and feels she may need to f/u with Orthopedics to discuss other recommendations . Determined patient is having transportation issues and would like to speak with the embedded BSW . Sent in basket message to Daneen Schick BSW requesting outreach to patient to provide resources for transportation  . Discussed plans with patient for ongoing care management follow up and provided patient with direct contact information for care management team  Patient Self Care Activities:  . Self administers medications as prescribed . Calls pharmacy for medication refills . Calls provider office for new concerns or questions  Initial goal documentation     .  "to keep  my BP in good control" (pt-stated)        CARE PLAN ENTRY (see longtitudinal plan of care for additional care plan information)  Current Barriers:  Marland Kitchen Knowledge Deficits related to disease process and Self Health management of HTN . Chronic Disease Management support and education needs related to HTN, Chronic gout without tophus, Chronic right knee  pain  Nurse Case Manager Clinical Goal(s):  . New 06/26/20 Over the next 180 days, patient will work with the CCM team and PCP to address needs related to disease education and support to improve Self Health management of HTN  CCM RN CM Interventions:  06/26/20 call completed with patient  . Evaluation of current treatment plan related to HTN and patient's adherence to plan as established by provider. . Determined patient is Self checking her BP at home, reports BP is <130/80 . Reviewed and discussed current medication regimen, patient reports adherence, requesting refills, current regimen: o Atenolol (Tenormin) 50 mg, 1 tablet daily o Olmesartan-hydrochlorothiazide (Benicar HCT) 40-12.5 mg, 1 tablet daily . Re-educated patient on dietary and exercise recommendations . Discussed plans with patient for ongoing care management follow up and provided patient with direct contact information for care management team  Patient Self Care Activities:  . Self administers medications as prescribed . Calls pharmacy for medication refills . Calls provider office for new concerns or questions  Please see past updates related to this goal by clicking on the "Past Updates" button in the selected goal        Plan:   Telephone follow up appointment with care management team member scheduled for: 07/03/20  Barb Merino, RN, BSN, CCM Care Management Coordinator [place] [facility]  Direct Phone: [phone]

## 2020-06-27 NOTE — Patient Instructions (Signed)
Visit Information  Goals Addressed      Patient Stated   .  "to improve my knee pain" (pt-stated)        CARE PLAN ENTRY (see longitudinal plan of care for additional care plan information)  Current Barriers:  Marland Kitchen Knowledge Deficits related to disease process and Self Health management of chronic right knee pain  . Chronic Disease Management support and education needs related to HTN, Chronic gout without tophus, unspecified cause, unspecified site, Chronic right knee pain  . Transportation barriers  Nurse Case Manager Clinical Goal(s):  Marland Kitchen Over the next 180 days, patient will work with the CCM team and PCP to address needs related to disease education and support for improved Self Health management of chronic right knee pain   CCM RN CM Interventions:  06/26/20 call completed with patient  . Inter-disciplinary care team collaboration (see longitudinal plan of care) . Evaluation of current treatment plan related to chronic right knee pain and patient's adherence to plan as established by provider. . Reviewed medications with patient and discussed patient is taking Allopurinol 300 mg qd for chronic gout   . Determined patient has had a past right knee replacement and feels she may need to f/u with Orthopedics to discuss other recommendations . Determined patient is having transportation issues and would like to speak with the embedded BSW . Sent in basket message to Daneen Schick BSW requesting outreach to patient to provide resources for transportation  . Discussed plans with patient for ongoing care management follow up and provided patient with direct contact information for care management team  Patient Self Care Activities:  . Self administers medications as prescribed . Calls pharmacy for medication refills . Calls provider office for new concerns or questions  Initial goal documentation     .  "to keep my BP in good control" (pt-stated)        CARE PLAN ENTRY (see longtitudinal  plan of care for additional care plan information)  Current Barriers:  Marland Kitchen Knowledge Deficits related to disease process and Self Health management of HTN . Chronic Disease Management support and education needs related to HTN, Chronic gout without tophus, Chronic right knee pain  Nurse Case Manager Clinical Goal(s):  . New 06/26/20 Over the next 180 days, patient will work with the CCM team and PCP to address needs related to disease education and support to improve Self Health management of HTN  CCM RN CM Interventions:  06/26/20 call completed with patient  . Evaluation of current treatment plan related to HTN and patient's adherence to plan as established by provider. . Determined patient is Self checking her BP at home, reports BP is <130/80 . Reviewed and discussed current medication regimen, patient reports adherence, requesting refills, current regimen: o Atenolol (Tenormin) 50 mg, 1 tablet daily o Olmesartan-hydrochlorothiazide (Benicar HCT) 40-12.5 mg, 1 tablet daily . Re-educated patient on dietary and exercise recommendations . Discussed plans with patient for ongoing care management follow up and provided patient with direct contact information for care management team  Patient Self Care Activities:  . Self administers medications as prescribed . Calls pharmacy for medication refills . Calls provider office for new concerns or questions  Please see past updates related to this goal by clicking on the "Past Updates" button in the selected goal        Patient verbalizes understanding of instructions provided today.   Telephone follow up appointment with care management team member scheduled for: 07/03/20  Barb Merino, RN,  BSN, CCM Care Management Coordinator [place] [facility]  Direct Phone: [phone]

## 2020-07-03 ENCOUNTER — Ambulatory Visit: Payer: Medicare Other

## 2020-07-03 DIAGNOSIS — M1A9XX0 Chronic gout, unspecified, without tophus (tophi): Secondary | ICD-10-CM

## 2020-07-03 DIAGNOSIS — G8929 Other chronic pain: Secondary | ICD-10-CM

## 2020-07-03 DIAGNOSIS — I10 Essential (primary) hypertension: Secondary | ICD-10-CM

## 2020-07-03 NOTE — Patient Instructions (Signed)
Social Worker Visit Information  Goals we discussed today:  Goals Addressed            This Visit's Progress   . Collaborate with RN Care Manager to perform approrpiate assessments to assist with care coordination needs       CARE PLAN ENTRY (see longitudinal plan of care for additional care plan information)  Current Barriers:  . Transportation . ADL IADL limitations due to knee pain . Chronic conditions including HTN, chronic gout, and chronic knee pain which put patient at increased risk for admission  Social Work Clinical Goal(s):  Marland Kitchen Over the next 45 days the patient will work with SW to become more knowledgeable of transportation resources . Over the next 60 days the patient will follow up with her primary provider as directed by SW  CCM SW Interventions: Completed 07/03/20 . Inter-disciplinary care team collaboration (see longitudinal plan of care) . Successful outbound call placed to the patient to assist with care coordination needs . Determined the patient and her spouse are currently living with their daughter in [place] due to the home having less steps and being safer . Discussed the patient is no longer driving due to right knee pain o The patient is using a cane for ambulation o The patient feels she may need surgery to assist with knee pain but is not currently established with an orthopedic provider . Reviewed patient interested in transportation resources o The patients son and daughter have been assisting with transportation but the patient would like other resources o Educated the patient on [place] via phone o Mailed the patient a [employer] . Performed chart review to note primary provider office attempted contact on 11/3 to schedule an OV o Encouraged the patient to contact her PCP office to schedule an OV as she has not been seen since 07/20/19 which was a virtual visit o The patient  reports she does not feel that she can visit the practice at this time due to limitations caused by knee pain o Advised the patient SW would request outreach to the patient to review need for OV and determine if another virtual visit can be offered . Collaboration with Fransisco Beau, CMA to request follow up with the patient regarding OV scheduling . Scheduled follow up SW call to the patient over the next month  Patient Self Care Activities:  . Patient verbalizes understanding of plan to contact SW as needed prior to next scheduled outreach . Self administers medications as prescribed . Calls pharmacy for medication refills . Calls provider office for new concerns or questions  Initial goal documentation         Materials Provided: Yes: mailed the patient information on [facility]  Follow Up Plan: SW will follow up with patient by phone over the next month   Daneen Schick, BSW, CDP Social Worker, Certified Dementia Practitioner [place] [facility] [employer] [facility] [phone]

## 2020-07-03 NOTE — Chronic Care Management (AMB) (Signed)
Chronic Care Management    Social Work Follow Up Note  07/03/2020 Name: Nicole Oconnor MRN: [id number] DOB: 1946-03-22  Nicole Oconnor is a 74 y.o. year old female who is a primary care patient of [name], MD. The CCM team was consulted for assistance with care coordination.   Review of patient status, including review of consultants reports, other relevant assessments, and collaboration with appropriate care team members and the patient's provider was performed as part of comprehensive patient evaluation and provision of chronic care management services.    SDOH (Social Determinants of Health) assessments performed: Yes    Outpatient Encounter Medications as of 07/03/2020  Medication Sig  . allopurinol (ZYLOPRIM) 300 MG tablet Take 1 tablet (300 mg total) by mouth daily.  Marland Kitchen aspirin 81 MG tablet Take 81 mg by mouth daily.  Marland Kitchen atenolol (TENORMIN) 50 MG tablet Take 1 tablet (50 mg total) by mouth daily.  Marland Kitchen loratadine (CLARITIN) 10 MG tablet TAKE 1 TABLET BY MOUTH EVERY DAY  . olmesartan-hydrochlorothiazide (BENICAR HCT) 40-12.5 MG tablet Take 1 tablet by mouth daily.   No facility-administered encounter medications on file as of 07/03/2020.     Goals Addressed            This Visit's Progress   . Collaborate with RN Care Manager to perform approrpiate assessments to assist with care coordination needs       CARE PLAN ENTRY (see longitudinal plan of care for additional care plan information)  Current Barriers:  . Transportation . ADL IADL limitations due to knee pain . Chronic conditions including HTN, chronic gout, and chronic knee pain which put patient at increased risk for admission  Social Work Clinical Goal(s):  Marland Kitchen Over the next 45 days the patient will work with SW to become more knowledgeable of transportation resources . Over the next 60 days the patient will follow up with her primary provider as directed by SW  CCM SW Interventions: Completed  07/03/20 . Inter-disciplinary care team collaboration (see longitudinal plan of care) . Successful outbound call placed to the patient to assist with care coordination needs . Determined the patient and her spouse are currently living with their daughter in [place] due to the home having less steps and being safer . Discussed the patient is no longer driving due to right knee pain o The patient is using a cane for ambulation o The patient feels she may need surgery to assist with knee pain but is not currently established with an orthopedic provider . Reviewed patient interested in transportation resources o The patients son and daughter have been assisting with transportation but the patient would like other resources o Educated the patient on [place] via phone o Mailed the patient a [employer] . Performed chart review to note primary provider office attempted contact on 11/3 to schedule an OV o Encouraged the patient to contact her PCP office to schedule an OV as she has not been seen since 07/20/19 which was a virtual visit o The patient reports she does not feel that she can visit the practice at this time due to limitations caused by knee pain o Advised the patient SW would request outreach to the patient to review need for OV and determine if another virtual visit can be offered . Collaboration with Fransisco Beau, CMA to request follow up with the patient regarding OV scheduling . Scheduled follow up SW call to the patient over the  next month  Patient Self Care Activities:  . Patient verbalizes understanding of plan to contact SW as needed prior to next scheduled outreach . Self administers medications as prescribed . Calls pharmacy for medication refills . Calls provider office for new concerns or questions  Initial goal documentation         Follow Up Plan: SW will follow up with patient by phone over the  next month.   Daneen Schick, BSW, CDP Social Worker, Certified Dementia Practitioner [place] / [place] [facility] [phone]  Total time spent performing care coordination and/or care management activities with the patient by phone or face to face = 20 minutes.

## 2020-08-01 ENCOUNTER — Telehealth: Payer: Medicare Other

## 2020-08-06 ENCOUNTER — Telehealth: Payer: Self-pay

## 2020-08-06 ENCOUNTER — Telehealth: Payer: Medicare Other

## 2020-08-06 NOTE — Telephone Encounter (Signed)
  Chronic Care Management   Outreach Note  08/06/2020 Name: Nicole Oconnor MRN: [id number] DOB: Apr 17, 1946  Referred by: [name], MD Reason for referral : [place] is enrolled in a Managed Medicaid Health Plan: No  An unsuccessful telephone outreach was attempted today. The patient was referred to the case management team for assistance with care management and care coordination.   Follow Up Plan: A HIPAA compliant phone message was left for the patient providing contact information and requesting a return call.  The care management team will reach out to the patient again over the next 30 days.   Daneen Schick, BSW, CDP Social Worker, Certified Dementia Practitioner [place] [facility] [place] [facility] [phone]

## 2020-08-21 ENCOUNTER — Telehealth: Payer: Medicare Other

## 2020-08-21 ENCOUNTER — Telehealth: Payer: Self-pay

## 2020-08-21 NOTE — Telephone Encounter (Signed)
  Chronic Care Management   Outreach Note  08/21/2020 Name: Nicole Oconnor MRN: [id number] DOB: 1946/07/15  Referred by: Dorothyann Peng, MD Reason for referral : Care Coordination   Nicole Oconnor is enrolled in a Managed Medicaid Health Plan: No  A second unsuccessful telephone outreach was attempted today. The patient was referred to the case management team for assistance with care management and care coordination.   Follow Up Plan: A HIPAA compliant phone message was left for the patient providing contact information and requesting a return call.  The care management team will reach out to the patient again over the next 30 days.   Bevelyn Ngo, BSW, CDP Social Worker, Certified Dementia Practitioner [facility] Care Management [phone]

## 2020-09-10 ENCOUNTER — Telehealth: Payer: Medicare Other

## 2020-09-21 ENCOUNTER — Ambulatory Visit: Payer: Self-pay

## 2020-09-21 ENCOUNTER — Telehealth: Payer: Medicare Other

## 2020-09-21 DIAGNOSIS — M1A9XX0 Chronic gout, unspecified, without tophus (tophi): Secondary | ICD-10-CM

## 2020-09-21 DIAGNOSIS — I10 Essential (primary) hypertension: Secondary | ICD-10-CM

## 2020-09-21 NOTE — Chronic Care Management (AMB) (Signed)
  Chronic Care Management   Outreach Note  09/21/2020 Name: Nicole Oconnor MRN: [id number] DOB: 12-Sep-1945  Referred by: [name], MD Reason for referral : Care Coordination   Third unsuccessful telephone outreach was attempted today. The patient was referred to the case management team for assistance with care management and care coordination. The patient's primary care provider has been notified of our unsuccessful attempts to make or maintain contact with the patient. The care management team is pleased to engage with this patient at any time in the future should he/she be interested in assistance from the care management team.   Follow Up Plan: No SW follow up planned at this time due to inability to maintain patient contact.  Daneen Schick, BSW, CDP Social Worker, Certified Dementia Practitioner [place] / [place] [facility] [phone]

## 2020-10-15 ENCOUNTER — Telehealth: Payer: Medicare Other

## 2020-10-15 ENCOUNTER — Telehealth: Payer: Self-pay

## 2020-10-15 NOTE — Telephone Encounter (Signed)
  Chronic Care Management   Outreach Note  10/15/2020 Name: GRACIA SAGGESE MRN: [id number] DOB: 07/21/1946  Referred by: [name], MD Reason for referral : Chronic Care Management (RN CM FU Call Attempt)   An unsuccessful telephone outreach was attempted today. The patient was referred to the case management team for assistance with care management and care coordination.   Follow Up Plan: A HIPAA compliant phone message was left for the patient providing contact information and requesting a return call. Telephone follow up appointment with care management team member scheduled for: 11/14/20  Barb Merino, RN, BSN, CCM Care Management Coordinator [place] [facility]  Direct Phone: [phone]

## 2020-11-14 ENCOUNTER — Telehealth: Payer: Self-pay

## 2020-11-14 ENCOUNTER — Telehealth: Payer: Medicare Other

## 2020-11-14 ENCOUNTER — Ambulatory Visit (INDEPENDENT_AMBULATORY_CARE_PROVIDER_SITE_OTHER): Payer: Medicare Other

## 2020-11-14 DIAGNOSIS — M1A9XX0 Chronic gout, unspecified, without tophus (tophi): Secondary | ICD-10-CM

## 2020-11-14 DIAGNOSIS — I10 Essential (primary) hypertension: Secondary | ICD-10-CM

## 2020-11-14 DIAGNOSIS — G8929 Other chronic pain: Secondary | ICD-10-CM

## 2020-11-14 NOTE — Telephone Encounter (Signed)
  Chronic Care Management   Outreach Note  11/14/2020 Name: Nicole Oconnor MRN: [id number] DOB: September 07, 1945  Referred by: [name], MD Reason for referral : Chronic Care Management (RN CM FU Call Attempt )   An unsuccessful telephone outreach was attempted today. The patient was referred to the case management team for assistance with care management and care coordination.   Follow Up Plan: A HIPAA compliant phone message was left for the patient providing contact information and requesting a return call. Telephone follow up appointment with care management team member scheduled for: 12/19/20  Barb Merino, RN, BSN, CCM Care Management Coordinator [place] [facility]  Direct Phone: [phone]

## 2020-11-23 NOTE — Patient Instructions (Signed)
Goals Addressed      Other   .  Effective Health Maintenance - Acheived   On track     Timeframe:  Short-Term Goal Priority:  High Start Date:  11/14/20                           Expected End Date:  01/18/21  Next Scheduled Follow Up date: 01/16/21  Self Care Activities:  . - call for medicine refill 2 or 3 days before it runs out . - call if I am sick and can't take my medicine . - keep a list of all the medicines I take; vitamins and herbals too . - use a pillbox to sort medicine . - use an alarm clock or phone to remind me to take my medicine Patient Goals: - schedule AWV with PCP - follow up with Orthopedic MD for evaluation and treatment of right knee pain                             .  Manage My Medicine   On track     Timeframe:  Short-Term Goal Priority:  High Start Date:  11/14/20                           Expected End Date:  05/17/21                     Follow Up Date: 01/16/21    - call for medicine refill 2 or 3 days before it runs out - call if I am sick and can't take my medicine - keep a list of all the medicines I take; vitamins and herbals too - use a pillbox to sort medicine - use an alarm clock or phone to remind me to take my medicine    Why is this important?   . These steps will help you keep on track with your medicines.   Notes:     .  Track and Manage My Blood Pressure-Hypertension   On track     Timeframe:  Short-Term Goal Priority:  Medium Start Date:  11/14/20                           Expected End Date: 05/17/21                     Follow Up Date: 01/16/21    - check blood pressure 3 times per week - choose a place to take my blood pressure (home, clinic or office, retail store) - write blood pressure results in a log or diary    Why is this important?    You won't feel high blood pressure, but it can still hurt your blood vessels.   High blood pressure can cause heart or kidney problems. It can also cause a stroke.   Making lifestyle  changes like losing a [place] weight or eating less salt will help.   Checking your blood pressure at home and at different times of the day can help to control blood pressure.   If the doctor prescribes medicine remember to take it the way the doctor ordered.   Call the office if you cannot afford the medicine or if there are questions about it.     Notes:

## 2020-11-23 NOTE — Chronic Care Management (AMB) (Signed)
Chronic Care Management   CCM RN Visit Note  11/14/2020 Name: Nicole Oconnor MRN: [id number] DOB: [birth year]  Subjective: Nicole Oconnor is a 75 y.o. year old female who is a primary care patient of Nicole Chard, MD. The care management team was consulted for assistance with disease management and care coordination needs.    Engaged with patient by telephone for follow up visit in response to provider referral for case management and/or care coordination services.   Consent to Services:  The patient was given information about Chronic Care Management services, agreed to services, and gave verbal consent prior to initiation of services.  Please see initial visit note for detailed documentation.   Patient agreed to services and verbal consent obtained.   Assessment: Review of patient past medical history, allergies, medications, health status, including review of consultants reports, laboratory and other test data, was performed as part of comprehensive evaluation and provision of chronic care management services.   SDOH (Social Determinants of Health) assessments and interventions performed:  Yes, no acute needs identified   CCM Care Plan  Allergies  Allergen Reactions  . Penicillins     Outpatient Encounter Medications as of 11/14/2020  Medication Sig  . allopurinol (ZYLOPRIM) 300 MG tablet Take 1 tablet (300 mg total) by mouth daily.  Marland Kitchen aspirin 81 MG tablet Take 81 mg by mouth daily.  Marland Kitchen atenolol (TENORMIN) 50 MG tablet Take 1 tablet (50 mg total) by mouth daily.  Marland Kitchen loratadine (CLARITIN) 10 MG tablet TAKE 1 TABLET BY MOUTH EVERY DAY  . olmesartan-hydrochlorothiazide (BENICAR HCT) 40-12.5 MG tablet Take 1 tablet by mouth daily.   No facility-administered encounter medications on file as of 11/14/2020.    Patient Active Problem List   Diagnosis Date Noted  . Essential hypertension 05/15/2018  . Gout 05/15/2018  . Edema 05/15/2018  . S/P abdominal supracervical subtotal  hysterectomy - 1999 03/31/2012    Conditions to be addressed/monitored:HTN, Chronic gout without tophus, unspecified cause, unspecified site, Chronic right knee pain   Care Plan : Hypertension (Adult)  Updates made by Lynne Logan, RN since 11/23/2020 12:00 AM    Problem: Hypertension (Hypertension)   Priority: Medium    Long-Range Goal: Hypertension Monitored   Start Date: 11/14/2020  Expected End Date: 05/17/2021  This Visit's Progress: On track  Priority: Medium  Note:   Objective:  . Last practice recorded BP readings:  BP Readings from Last 3 Encounters:  06/14/20 (!) 128/57  07/20/19 124/64  12/30/18 (!) 124/57 .   Marland Kitchen Most recent eGFR/CrCl: No results found for: EGFR  No components found for: CRCL Current Barriers:  Marland Kitchen Knowledge Deficits related to basic understanding of hypertension pathophysiology and self care management . Knowledge Deficits related to understanding of medications prescribed for management of hypertension . Does not attend all scheduled provider appointments . Lacks social connections Case Manager Clinical Goal(s):  . patient will demonstrate improved adherence to prescribed treatment plan for hypertension as evidenced by taking all medications as prescribed, monitoring and recording blood pressure as directed, adhering to low sodium/DASH diet Interventions:  . Collaboration with Nicole Chard, MD regarding development and update of comprehensive plan of care as evidenced by provider attestation and co-signature . Inter-disciplinary care team collaboration (see longitudinal plan of care) . Evaluation of current treatment plan related to hypertension self management and patient's adherence to plan as established by provider. . Provided education to patient re: stroke prevention, s/s of heart attack and stroke, DASH diet, complications of uncontrolled  blood pressure . Reviewed medications with patient and discussed importance of compliance . Discussed plans  with patient for ongoing care management follow up and provided patient with direct contact information for care management team . Advised patient, providing education and rationale, to monitor blood pressure daily and record, calling PCP for findings outside established parameters.  Self-Care Activities: - Self administers medications as prescribed Attends all scheduled provider appointments Calls provider office for new concerns, questions, or BP outside discussed parameters Checks BP and records as discussed Follows a low sodium diet/DASH diet Patient Goals: - check blood pressure 3 times per week - choose a place to take my blood pressure (home, clinic or office, retail store) - write blood pressure results in a log or diary  Follow Up Plan: Telephone follow up appointment with care management team member scheduled for: 01/16/21   Care Plan : Ineffective Health Maintenance  Updates made by Lynne Logan, RN since 11/23/2020 12:00 AM    Problem: Ineffective Health Maintenance   Priority: High    Long-Range Goal: Effectiven Health Maintenance Acheived   Start Date: 11/14/2020  Expected End Date: 01/18/2021  This Visit's Progress: Not on track  Priority: High  Note:   Current Barriers:   Ineffective Self Health Maintenance  Does not attend all scheduled provider appointments  Lacks social connections Clinical Goal(s):  Marland Kitchen Collaboration with Nicole Chard, MD regarding development and update of comprehensive plan of care as evidenced by provider attestation and co-signature . Inter-disciplinary care team collaboration (see longitudinal plan of care)  patient will work with care management team to address care coordination and chronic disease management needs related to Disease Management  Educational Needs  Care Coordination  Medication Management and Education  Psychosocial Support  Dementia and Caregiver Support   Interventions:  11/14/20 completed successful call with  patient   Evaluation of current treatment plan related to HTN, Chronic gout without tophus, unspecified cause, unspecified site, Chronic right knee pain , self-management and patient's adherence to plan as established by provider.  Collaboration with Nicole Chard, MD regarding development and update of comprehensive plan of care as evidenced by provider attestation       and co-signature  Inter-disciplinary care team collaboration (see longitudinal plan of care) . Determined patient's last PCP OV was completed in 10/2019 via virtual visit . Educated patient on need for annual PCP follow up, discussed patient will need to request a virtual visit . Sent in basket message to Ruth requesting she contact patient to assist with scheduling an AWV  . Review of patient status, including review of consultants reports, relevant laboratory and other test results, and medications completed. . Reviewed medications with patient and discussed importance of medication adherence  Discussed plans with patient for ongoing care management follow up and provided patient with direct contact information for care management team Self Care Activities:  . - call for medicine refill 2 or 3 days before it runs out . - call if I am sick and can't take my medicine . - keep a list of all the medicines I take; vitamins and herbals too . - use a pillbox to sort medicine . - use an alarm clock or phone to remind me to take my medicine Patient Goals: - schedule AWV with PCP - follow up with Orthopedic MD for evaluation and treatment of right knee pain   Follow Up Plan: Telephone appointment with care management team member scheduled for:  01/16/21    Plan:Telephone follow  up appointment with care management team member scheduled for:  01/16/21  Barb Merino, RN, BSN, CCM Care Management Coordinator [place] [facility]  Direct Phone: [phone]

## 2020-11-26 ENCOUNTER — Telehealth: Payer: Self-pay

## 2020-11-26 NOTE — Telephone Encounter (Signed)
I called the pt to schedule her a virtual appointment because I got a message from angel.

## 2020-11-26 NOTE — Telephone Encounter (Signed)
-----   Message from Lynne Logan, RN sent at 11/23/2020  2:01 PM EDT ----- Regarding: patient needs appointment Hello Caleen Jobs,   Can you please call Mrs. Goecke to scheduled a virtual visit?   Thank you! [employer]

## 2020-12-04 ENCOUNTER — Telehealth: Payer: Self-pay

## 2020-12-04 NOTE — Telephone Encounter (Signed)
The pt was scheduled a virtual appt and told that she would get a text message on her phone for the appt

## 2020-12-05 ENCOUNTER — Telehealth: Payer: Medicare Other | Admitting: Internal Medicine

## 2020-12-19 ENCOUNTER — Telehealth: Payer: Medicare Other

## 2020-12-25 DIAGNOSIS — Z96653 Presence of artificial knee joint, bilateral: Secondary | ICD-10-CM | POA: Diagnosis not present

## 2020-12-25 DIAGNOSIS — T84032A Mechanical loosening of internal right knee prosthetic joint, initial encounter: Secondary | ICD-10-CM | POA: Diagnosis not present

## 2020-12-28 ENCOUNTER — Other Ambulatory Visit: Payer: Self-pay | Admitting: Nurse Practitioner

## 2020-12-28 DIAGNOSIS — I10 Essential (primary) hypertension: Secondary | ICD-10-CM

## 2020-12-28 DIAGNOSIS — M1A9XX0 Chronic gout, unspecified, without tophus (tophi): Secondary | ICD-10-CM

## 2021-01-16 ENCOUNTER — Telehealth: Payer: Medicare Other

## 2021-01-16 ENCOUNTER — Telehealth: Payer: Self-pay

## 2021-01-16 NOTE — Telephone Encounter (Signed)
  Care Management   Follow Up Note   01/16/2021 Name: Nicole Oconnor MRN: [id number] DOB: May 15, 1946   Referred by: [name], MD Reason for referral : Chronic Care Management (RNCM Follow up call - 1st attempt )   An unsuccessful telephone outreach was attempted today. The patient was referred to the case management team for assistance with care management and care coordination.   Follow Up Plan: A HIPPA compliant phone message was left for the patient providing contact information and requesting a return call.   Barb Merino, RN, BSN, CCM Care Management Coordinator [place] [facility]  Direct Phone: [phone]

## 2021-01-29 DIAGNOSIS — T84032A Mechanical loosening of internal right knee prosthetic joint, initial encounter: Secondary | ICD-10-CM | POA: Diagnosis not present

## 2021-02-07 ENCOUNTER — Telehealth: Payer: Medicare Other

## 2021-02-07 ENCOUNTER — Ambulatory Visit: Payer: Self-pay

## 2021-02-07 DIAGNOSIS — I10 Essential (primary) hypertension: Secondary | ICD-10-CM

## 2021-02-07 DIAGNOSIS — M25561 Pain in right knee: Secondary | ICD-10-CM

## 2021-02-07 DIAGNOSIS — M1A9XX0 Chronic gout, unspecified, without tophus (tophi): Secondary | ICD-10-CM

## 2021-02-07 NOTE — Chronic Care Management (AMB) (Signed)
Chronic Care Management   CCM RN Visit Note  02/07/2021 Name: Nicole Oconnor MRN: [id number] DOB: [birth year]  Subjective: Nicole Oconnor is a 75 y.o. year old female who is a primary care patient of [name], MD. The care management team was consulted for assistance with disease management and care coordination needs.    CCM Case Closure  due to AWV not completed within 12 month period. Previously enrolled in response to provider referral for case management and/or care coordination services.   Consent to Services:  The patient was given information about Chronic Care Management services, agreed to services, and gave verbal consent prior to initiation of services.  Please see initial visit note for detailed documentation.   Patient agreed to services and verbal consent obtained.   Assessment: Review of patient past medical history, allergies, medications, health status, including review of consultants reports, laboratory and other test data, was performed as part of comprehensive evaluation and provision of chronic care management services.   SDOH (Social Determinants of Health) assessments and interventions performed:  no  CCM Care Plan  Allergies  Allergen Reactions   Penicillins     Outpatient Encounter Medications as of 02/07/2021  Medication Sig   allopurinol (ZYLOPRIM) 300 MG tablet TAKE 1 TABLET BY MOUTH EVERY DAY   aspirin 81 MG tablet Take 81 mg by mouth daily.   atenolol (TENORMIN) 50 MG tablet TAKE 1 TABLET BY MOUTH EVERY DAY   loratadine (CLARITIN) 10 MG tablet TAKE 1 TABLET BY MOUTH EVERY DAY   olmesartan-hydrochlorothiazide (BENICAR HCT) 40-12.5 MG tablet TAKE 1 TABLET BY MOUTH EVERY DAY   No facility-administered encounter medications on file as of 02/07/2021.    Patient Active Problem List   Diagnosis Date Noted   Essential hypertension 05/15/2018   Gout 05/15/2018   Edema 05/15/2018   S/P abdominal supracervical subtotal hysterectomy - 1999 03/31/2012     Conditions to be addressed/monitored: HTN, Chronic gout without tophus, unspecified cause, unspecified site, Chronic right knee pain   Care Plan : Hypertension (Adult)  Updates made by Lynne Logan, RN since 02/07/2021 12:00 AM  Completed 02/07/2021   Problem: Hypertension (Hypertension) Resolved 02/07/2021  Priority: Medium     Long-Range Goal: Hypertension Monitored Completed 02/07/2021  Start Date: 11/14/2020  Expected End Date: 05/17/2021  Recent Progress: On track  Priority: Medium  Note:   Objective:  Last practice recorded BP readings:  BP Readings from Last 3 Encounters:  06/14/20 (!) 128/57  07/20/19 124/64  12/30/18 (!) 124/57   Most recent eGFR/CrCl: No results found for: EGFR  No components found for: CRCL Current Barriers:  Knowledge Deficits related to basic understanding of hypertension pathophysiology and self care management Knowledge Deficits related to understanding of medications prescribed for management of hypertension Does not attend all scheduled provider appointments Lacks social connections Case Manager Clinical Goal(s):  patient will demonstrate improved adherence to prescribed treatment plan for hypertension as evidenced by taking all medications as prescribed, monitoring and recording blood pressure as directed, adhering to low sodium/DASH diet Interventions:  02/07/21 CCM Case Closure  Collaboration with [name], MD regarding development and update of comprehensive plan of care as evidenced by provider attestation and co-signature Inter-disciplinary care team collaboration (see longitudinal plan of care) Patient past due for AWV, disenrolled from CCM until after AWV is current Self-Care Activities: - Self administers medications as prescribed Attends all scheduled provider appointments Calls provider office for new concerns, questions, or BP outside discussed parameters Checks BP and  records as discussed Follows a low sodium diet/DASH  diet Patient Goals: - check blood pressure 3 times per week - choose a place to take my blood pressure (home, clinic or office, retail store) - write blood pressure results in a log or diary  Follow Up Plan: Follow up with provider re: reenrollment for CCM following completion of AWV      Care Plan : Ineffective Health Maintenance  Updates made by Lynne Logan, RN since 02/07/2021 12:00 AM  Completed 02/07/2021   Problem: Ineffective Health Maintenance Resolved 02/07/2021  Priority: High     Long-Range Goal: Effectiven Health Maintenance Acheived Completed 02/07/2021  Start Date: 11/14/2020  Expected End Date: 01/18/2021  Recent Progress: Not on track  Priority: High  Note:   Current Barriers:  Ineffective Self Health Maintenance Does not attend all scheduled provider appointments Lacks social connections Clinical Goal(s):  Collaboration with [name], MD regarding development and update of comprehensive plan of care as evidenced by provider attestation and co-signature Inter-disciplinary care team collaboration (see longitudinal plan of care) patient will work with care management team to address care coordination and chronic disease management needs related to Disease Management Educational Needs Care Coordination Medication Management and Education Psychosocial Support Dementia and Caregiver Support   Interventions:  02/07/21 CCM Case Closure Evaluation of current treatment plan related to HTN, Chronic gout without tophus, unspecified cause, unspecified site, Chronic right knee pain , self-management and patient's adherence to plan as established by provider. Collaboration with [name], MD regarding development and update of comprehensive plan of care as evidenced by provider attestation       and co-signature Inter-disciplinary care team collaboration (see longitudinal plan of care) Patient past due for AWV, disenrolled from CCM until after AWV is current Self  Care Activities:  - call for medicine refill 2 or 3 days before it runs out - call if I am sick and can't take my medicine - keep a list of all the medicines I take; vitamins and herbals too - use a pillbox to sort medicine - use an alarm clock or phone to remind me to take my medicine Patient Goals: - schedule AWV with PCP - follow up with Orthopedic MD for evaluation and treatment of right knee pain   Follow Up Plan: Follow up with provider re: refer patient back to CCM for re-enrollment once AWV is completed       Plan:Follow up with provider re: referral for re-enrollment once the AWV has been completed   Barb Merino, RN, BSN, CCM Care Management Coordinator [facility]  Direct Phone: [phone]

## 2021-03-30 ENCOUNTER — Other Ambulatory Visit: Payer: Self-pay | Admitting: Nurse Practitioner

## 2021-04-08 DIAGNOSIS — M25562 Pain in left knee: Secondary | ICD-10-CM | POA: Diagnosis not present

## 2021-04-08 DIAGNOSIS — T84018A Broken internal joint prosthesis, other site, initial encounter: Secondary | ICD-10-CM | POA: Diagnosis not present

## 2021-04-08 DIAGNOSIS — Z96651 Presence of right artificial knee joint: Secondary | ICD-10-CM | POA: Diagnosis not present

## 2021-04-08 DIAGNOSIS — M85861 Other specified disorders of bone density and structure, right lower leg: Secondary | ICD-10-CM | POA: Diagnosis not present

## 2021-04-08 DIAGNOSIS — G8929 Other chronic pain: Secondary | ICD-10-CM | POA: Diagnosis not present

## 2021-04-08 DIAGNOSIS — M85862 Other specified disorders of bone density and structure, left lower leg: Secondary | ICD-10-CM | POA: Diagnosis not present

## 2021-04-08 DIAGNOSIS — M25561 Pain in right knee: Secondary | ICD-10-CM | POA: Diagnosis not present

## 2021-04-08 DIAGNOSIS — Z96659 Presence of unspecified artificial knee joint: Secondary | ICD-10-CM | POA: Diagnosis not present

## 2021-04-08 DIAGNOSIS — Z96653 Presence of artificial knee joint, bilateral: Secondary | ICD-10-CM | POA: Diagnosis not present

## 2021-04-18 DIAGNOSIS — M25461 Effusion, right knee: Secondary | ICD-10-CM | POA: Diagnosis not present

## 2021-04-18 DIAGNOSIS — M25562 Pain in left knee: Secondary | ICD-10-CM | POA: Diagnosis not present

## 2021-04-18 DIAGNOSIS — Z96653 Presence of artificial knee joint, bilateral: Secondary | ICD-10-CM | POA: Diagnosis not present

## 2021-04-18 DIAGNOSIS — T84033A Mechanical loosening of internal left knee prosthetic joint, initial encounter: Secondary | ICD-10-CM | POA: Diagnosis not present

## 2021-04-18 DIAGNOSIS — Z96651 Presence of right artificial knee joint: Secondary | ICD-10-CM | POA: Diagnosis not present

## 2021-04-18 DIAGNOSIS — T84032A Mechanical loosening of internal right knee prosthetic joint, initial encounter: Secondary | ICD-10-CM | POA: Diagnosis not present

## 2021-04-18 DIAGNOSIS — M25462 Effusion, left knee: Secondary | ICD-10-CM | POA: Diagnosis not present

## 2021-04-18 DIAGNOSIS — M25561 Pain in right knee: Secondary | ICD-10-CM | POA: Diagnosis not present

## 2021-04-18 DIAGNOSIS — Z01818 Encounter for other preprocedural examination: Secondary | ICD-10-CM | POA: Diagnosis not present

## 2021-05-14 DIAGNOSIS — T84012S Broken internal right knee prosthesis, sequela: Secondary | ICD-10-CM | POA: Diagnosis not present

## 2021-05-14 DIAGNOSIS — Z01812 Encounter for preprocedural laboratory examination: Secondary | ICD-10-CM | POA: Diagnosis not present

## 2021-05-14 DIAGNOSIS — Q600 Renal agenesis, unilateral: Secondary | ICD-10-CM

## 2021-05-14 DIAGNOSIS — Z96651 Presence of right artificial knee joint: Secondary | ICD-10-CM | POA: Diagnosis not present

## 2021-05-14 HISTORY — DX: Renal agenesis, unilateral: Q60.0

## 2021-05-16 DIAGNOSIS — T84018A Broken internal joint prosthesis, other site, initial encounter: Secondary | ICD-10-CM | POA: Diagnosis not present

## 2021-05-16 DIAGNOSIS — Z96659 Presence of unspecified artificial knee joint: Secondary | ICD-10-CM | POA: Diagnosis not present

## 2021-05-16 DIAGNOSIS — M25562 Pain in left knee: Secondary | ICD-10-CM | POA: Diagnosis not present

## 2021-05-16 DIAGNOSIS — M25561 Pain in right knee: Secondary | ICD-10-CM | POA: Diagnosis not present

## 2021-05-16 DIAGNOSIS — G8929 Other chronic pain: Secondary | ICD-10-CM | POA: Diagnosis not present

## 2021-05-31 DIAGNOSIS — Z96659 Presence of unspecified artificial knee joint: Secondary | ICD-10-CM | POA: Diagnosis not present

## 2021-05-31 DIAGNOSIS — T84018A Broken internal joint prosthesis, other site, initial encounter: Secondary | ICD-10-CM | POA: Diagnosis not present

## 2021-06-04 DIAGNOSIS — I351 Nonrheumatic aortic (valve) insufficiency: Secondary | ICD-10-CM | POA: Diagnosis not present

## 2021-06-04 DIAGNOSIS — K30 Functional dyspepsia: Secondary | ICD-10-CM | POA: Diagnosis not present

## 2021-06-04 DIAGNOSIS — R918 Other nonspecific abnormal finding of lung field: Secondary | ICD-10-CM | POA: Diagnosis not present

## 2021-06-04 DIAGNOSIS — M25562 Pain in left knee: Secondary | ICD-10-CM | POA: Diagnosis not present

## 2021-06-04 DIAGNOSIS — T8489XA Other specified complication of internal orthopedic prosthetic devices, implants and grafts, initial encounter: Secondary | ICD-10-CM | POA: Diagnosis not present

## 2021-06-04 DIAGNOSIS — R000 Tachycardia, unspecified: Secondary | ICD-10-CM | POA: Diagnosis not present

## 2021-06-04 DIAGNOSIS — R0902 Hypoxemia: Secondary | ICD-10-CM | POA: Diagnosis not present

## 2021-06-04 DIAGNOSIS — Z6836 Body mass index (BMI) 36.0-36.9, adult: Secondary | ICD-10-CM | POA: Diagnosis not present

## 2021-06-04 DIAGNOSIS — Z7409 Other reduced mobility: Secondary | ICD-10-CM | POA: Diagnosis not present

## 2021-06-04 DIAGNOSIS — R109 Unspecified abdominal pain: Secondary | ICD-10-CM | POA: Diagnosis not present

## 2021-06-04 DIAGNOSIS — R1310 Dysphagia, unspecified: Secondary | ICD-10-CM | POA: Diagnosis not present

## 2021-06-04 DIAGNOSIS — Z9889 Other specified postprocedural states: Secondary | ICD-10-CM | POA: Diagnosis not present

## 2021-06-04 DIAGNOSIS — Z20822 Contact with and (suspected) exposure to covid-19: Secondary | ICD-10-CM | POA: Diagnosis present

## 2021-06-04 DIAGNOSIS — D200 Benign neoplasm of soft tissue of retroperitoneum: Secondary | ICD-10-CM | POA: Diagnosis not present

## 2021-06-04 DIAGNOSIS — R2681 Unsteadiness on feet: Secondary | ICD-10-CM | POA: Diagnosis not present

## 2021-06-04 DIAGNOSIS — N189 Chronic kidney disease, unspecified: Secondary | ICD-10-CM | POA: Diagnosis not present

## 2021-06-04 DIAGNOSIS — E873 Alkalosis: Secondary | ICD-10-CM | POA: Diagnosis not present

## 2021-06-04 DIAGNOSIS — T8131XA Disruption of external operation (surgical) wound, not elsewhere classified, initial encounter: Secondary | ICD-10-CM | POA: Diagnosis not present

## 2021-06-04 DIAGNOSIS — R0689 Other abnormalities of breathing: Secondary | ICD-10-CM | POA: Diagnosis not present

## 2021-06-04 DIAGNOSIS — E278 Other specified disorders of adrenal gland: Secondary | ICD-10-CM | POA: Diagnosis not present

## 2021-06-04 DIAGNOSIS — T84018A Broken internal joint prosthesis, other site, initial encounter: Secondary | ICD-10-CM | POA: Diagnosis not present

## 2021-06-04 DIAGNOSIS — R0602 Shortness of breath: Secondary | ICD-10-CM | POA: Diagnosis not present

## 2021-06-04 DIAGNOSIS — T84032A Mechanical loosening of internal right knee prosthetic joint, initial encounter: Secondary | ICD-10-CM | POA: Diagnosis present

## 2021-06-04 DIAGNOSIS — E279 Disorder of adrenal gland, unspecified: Secondary | ICD-10-CM | POA: Diagnosis not present

## 2021-06-04 DIAGNOSIS — K5652 Intestinal adhesions [bands] with complete obstruction: Secondary | ICD-10-CM | POA: Diagnosis not present

## 2021-06-04 DIAGNOSIS — Q600 Renal agenesis, unilateral: Secondary | ICD-10-CM | POA: Diagnosis not present

## 2021-06-04 DIAGNOSIS — M6281 Muscle weakness (generalized): Secondary | ICD-10-CM | POA: Diagnosis not present

## 2021-06-04 DIAGNOSIS — E669 Obesity, unspecified: Secondary | ICD-10-CM | POA: Diagnosis present

## 2021-06-04 DIAGNOSIS — G8929 Other chronic pain: Secondary | ICD-10-CM | POA: Diagnosis not present

## 2021-06-04 DIAGNOSIS — I129 Hypertensive chronic kidney disease with stage 1 through stage 4 chronic kidney disease, or unspecified chronic kidney disease: Secondary | ICD-10-CM | POA: Diagnosis not present

## 2021-06-04 DIAGNOSIS — E46 Unspecified protein-calorie malnutrition: Secondary | ICD-10-CM | POA: Diagnosis not present

## 2021-06-04 DIAGNOSIS — M109 Gout, unspecified: Secondary | ICD-10-CM | POA: Diagnosis present

## 2021-06-04 DIAGNOSIS — Z4682 Encounter for fitting and adjustment of non-vascular catheter: Secondary | ICD-10-CM | POA: Diagnosis not present

## 2021-06-04 DIAGNOSIS — Q602 Renal agenesis, unspecified: Secondary | ICD-10-CM | POA: Diagnosis not present

## 2021-06-04 DIAGNOSIS — E039 Hypothyroidism, unspecified: Secondary | ICD-10-CM | POA: Diagnosis not present

## 2021-06-04 DIAGNOSIS — T84092D Other mechanical complication of internal right knee prosthesis, subsequent encounter: Secondary | ICD-10-CM | POA: Diagnosis not present

## 2021-06-04 DIAGNOSIS — E43 Unspecified severe protein-calorie malnutrition: Secondary | ICD-10-CM | POA: Diagnosis not present

## 2021-06-04 DIAGNOSIS — K5900 Constipation, unspecified: Secondary | ICD-10-CM | POA: Diagnosis not present

## 2021-06-04 DIAGNOSIS — Z96651 Presence of right artificial knee joint: Secondary | ICD-10-CM | POA: Diagnosis not present

## 2021-06-04 DIAGNOSIS — J9601 Acute respiratory failure with hypoxia: Secondary | ICD-10-CM | POA: Diagnosis not present

## 2021-06-04 DIAGNOSIS — K56600 Partial intestinal obstruction, unspecified as to cause: Secondary | ICD-10-CM | POA: Diagnosis not present

## 2021-06-04 DIAGNOSIS — M25561 Pain in right knee: Secondary | ICD-10-CM | POA: Diagnosis present

## 2021-06-04 DIAGNOSIS — N179 Acute kidney failure, unspecified: Secondary | ICD-10-CM | POA: Diagnosis not present

## 2021-06-04 DIAGNOSIS — Z781 Physical restraint status: Secondary | ICD-10-CM | POA: Diagnosis not present

## 2021-06-04 DIAGNOSIS — J9811 Atelectasis: Secondary | ICD-10-CM | POA: Diagnosis not present

## 2021-06-04 DIAGNOSIS — T84092A Other mechanical complication of internal right knee prosthesis, initial encounter: Secondary | ICD-10-CM | POA: Diagnosis not present

## 2021-06-04 DIAGNOSIS — D631 Anemia in chronic kidney disease: Secondary | ICD-10-CM | POA: Diagnosis not present

## 2021-06-04 DIAGNOSIS — K567 Ileus, unspecified: Secondary | ICD-10-CM | POA: Diagnosis not present

## 2021-06-04 DIAGNOSIS — R112 Nausea with vomiting, unspecified: Secondary | ICD-10-CM | POA: Diagnosis not present

## 2021-06-04 DIAGNOSIS — K6389 Other specified diseases of intestine: Secondary | ICD-10-CM | POA: Diagnosis not present

## 2021-06-04 DIAGNOSIS — T8453XA Infection and inflammatory reaction due to internal right knee prosthesis, initial encounter: Secondary | ICD-10-CM | POA: Diagnosis not present

## 2021-06-04 DIAGNOSIS — Z96659 Presence of unspecified artificial knee joint: Secondary | ICD-10-CM | POA: Diagnosis not present

## 2021-06-04 DIAGNOSIS — G8918 Other acute postprocedural pain: Secondary | ICD-10-CM | POA: Diagnosis not present

## 2021-06-04 DIAGNOSIS — Z8719 Personal history of other diseases of the digestive system: Secondary | ICD-10-CM | POA: Diagnosis not present

## 2021-06-04 DIAGNOSIS — R1084 Generalized abdominal pain: Secondary | ICD-10-CM | POA: Diagnosis not present

## 2021-06-04 DIAGNOSIS — R140 Abdominal distension (gaseous): Secondary | ICD-10-CM | POA: Diagnosis not present

## 2021-06-04 DIAGNOSIS — I9581 Postprocedural hypotension: Secondary | ICD-10-CM | POA: Diagnosis not present

## 2021-06-04 DIAGNOSIS — E876 Hypokalemia: Secondary | ICD-10-CM | POA: Diagnosis not present

## 2021-06-04 DIAGNOSIS — Z905 Acquired absence of kidney: Secondary | ICD-10-CM | POA: Diagnosis not present

## 2021-06-04 DIAGNOSIS — T84018D Broken internal joint prosthesis, other site, subsequent encounter: Secondary | ICD-10-CM | POA: Diagnosis not present

## 2021-06-04 DIAGNOSIS — J90 Pleural effusion, not elsewhere classified: Secondary | ICD-10-CM | POA: Diagnosis not present

## 2021-06-04 DIAGNOSIS — T84012A Broken internal right knee prosthesis, initial encounter: Secondary | ICD-10-CM | POA: Diagnosis not present

## 2021-06-04 DIAGNOSIS — D62 Acute posthemorrhagic anemia: Secondary | ICD-10-CM | POA: Diagnosis not present

## 2021-06-04 DIAGNOSIS — Z4659 Encounter for fitting and adjustment of other gastrointestinal appliance and device: Secondary | ICD-10-CM | POA: Diagnosis not present

## 2021-06-04 DIAGNOSIS — Z48815 Encounter for surgical aftercare following surgery on the digestive system: Secondary | ICD-10-CM | POA: Diagnosis not present

## 2021-06-04 DIAGNOSIS — G4700 Insomnia, unspecified: Secondary | ICD-10-CM | POA: Diagnosis not present

## 2021-06-04 DIAGNOSIS — R5381 Other malaise: Secondary | ICD-10-CM | POA: Diagnosis not present

## 2021-06-04 DIAGNOSIS — R262 Difficulty in walking, not elsewhere classified: Secondary | ICD-10-CM | POA: Diagnosis not present

## 2021-06-04 DIAGNOSIS — I10 Essential (primary) hypertension: Secondary | ICD-10-CM | POA: Diagnosis present

## 2021-06-04 DIAGNOSIS — Z743 Need for continuous supervision: Secondary | ICD-10-CM | POA: Diagnosis not present

## 2021-06-04 DIAGNOSIS — D72829 Elevated white blood cell count, unspecified: Secondary | ICD-10-CM | POA: Diagnosis not present

## 2021-06-04 DIAGNOSIS — K469 Unspecified abdominal hernia without obstruction or gangrene: Secondary | ICD-10-CM | POA: Diagnosis not present

## 2021-06-05 DIAGNOSIS — I9581 Postprocedural hypotension: Secondary | ICD-10-CM | POA: Insufficient documentation

## 2021-06-10 HISTORY — PX: LYSIS OF ADHESION: SHX5961

## 2021-06-24 DIAGNOSIS — E279 Disorder of adrenal gland, unspecified: Secondary | ICD-10-CM | POA: Diagnosis not present

## 2021-06-24 DIAGNOSIS — R590 Localized enlarged lymph nodes: Secondary | ICD-10-CM | POA: Diagnosis not present

## 2021-06-24 DIAGNOSIS — Z743 Need for continuous supervision: Secondary | ICD-10-CM | POA: Diagnosis not present

## 2021-06-24 DIAGNOSIS — D62 Acute posthemorrhagic anemia: Secondary | ICD-10-CM | POA: Diagnosis not present

## 2021-06-24 DIAGNOSIS — Z96651 Presence of right artificial knee joint: Secondary | ICD-10-CM | POA: Diagnosis not present

## 2021-06-24 DIAGNOSIS — I9581 Postprocedural hypotension: Secondary | ICD-10-CM | POA: Diagnosis not present

## 2021-06-24 DIAGNOSIS — K5900 Constipation, unspecified: Secondary | ICD-10-CM | POA: Diagnosis not present

## 2021-06-24 DIAGNOSIS — R5381 Other malaise: Secondary | ICD-10-CM | POA: Diagnosis not present

## 2021-06-24 DIAGNOSIS — T8131XA Disruption of external operation (surgical) wound, not elsewhere classified, initial encounter: Secondary | ICD-10-CM | POA: Diagnosis not present

## 2021-06-24 DIAGNOSIS — Q600 Renal agenesis, unilateral: Secondary | ICD-10-CM | POA: Diagnosis not present

## 2021-06-24 DIAGNOSIS — E039 Hypothyroidism, unspecified: Secondary | ICD-10-CM | POA: Diagnosis not present

## 2021-06-24 DIAGNOSIS — I10 Essential (primary) hypertension: Secondary | ICD-10-CM | POA: Diagnosis not present

## 2021-06-24 DIAGNOSIS — Q602 Renal agenesis, unspecified: Secondary | ICD-10-CM | POA: Diagnosis not present

## 2021-06-24 DIAGNOSIS — R262 Difficulty in walking, not elsewhere classified: Secondary | ICD-10-CM | POA: Diagnosis not present

## 2021-06-24 DIAGNOSIS — Z79899 Other long term (current) drug therapy: Secondary | ICD-10-CM | POA: Diagnosis not present

## 2021-06-24 DIAGNOSIS — M25562 Pain in left knee: Secondary | ICD-10-CM | POA: Diagnosis not present

## 2021-06-24 DIAGNOSIS — M109 Gout, unspecified: Secondary | ICD-10-CM | POA: Diagnosis not present

## 2021-06-24 DIAGNOSIS — T84018D Broken internal joint prosthesis, other site, subsequent encounter: Secondary | ICD-10-CM | POA: Diagnosis not present

## 2021-06-24 DIAGNOSIS — Z7409 Other reduced mobility: Secondary | ICD-10-CM | POA: Diagnosis not present

## 2021-06-24 DIAGNOSIS — Z471 Aftercare following joint replacement surgery: Secondary | ICD-10-CM | POA: Diagnosis not present

## 2021-06-24 DIAGNOSIS — R2681 Unsteadiness on feet: Secondary | ICD-10-CM | POA: Diagnosis not present

## 2021-06-24 DIAGNOSIS — G8929 Other chronic pain: Secondary | ICD-10-CM | POA: Diagnosis not present

## 2021-06-24 DIAGNOSIS — T84092D Other mechanical complication of internal right knee prosthesis, subsequent encounter: Secondary | ICD-10-CM | POA: Diagnosis not present

## 2021-06-24 DIAGNOSIS — M6281 Muscle weakness (generalized): Secondary | ICD-10-CM | POA: Diagnosis not present

## 2021-06-24 DIAGNOSIS — Z48815 Encounter for surgical aftercare following surgery on the digestive system: Secondary | ICD-10-CM | POA: Diagnosis not present

## 2021-06-24 DIAGNOSIS — D200 Benign neoplasm of soft tissue of retroperitoneum: Secondary | ICD-10-CM | POA: Diagnosis not present

## 2021-06-25 ENCOUNTER — Telehealth: Payer: Self-pay | Admitting: Internal Medicine

## 2021-06-25 DIAGNOSIS — D62 Acute posthemorrhagic anemia: Secondary | ICD-10-CM | POA: Diagnosis not present

## 2021-06-25 DIAGNOSIS — Z96651 Presence of right artificial knee joint: Secondary | ICD-10-CM | POA: Diagnosis not present

## 2021-06-25 DIAGNOSIS — Z79899 Other long term (current) drug therapy: Secondary | ICD-10-CM | POA: Diagnosis not present

## 2021-06-25 DIAGNOSIS — M25562 Pain in left knee: Secondary | ICD-10-CM | POA: Diagnosis not present

## 2021-06-25 DIAGNOSIS — I10 Essential (primary) hypertension: Secondary | ICD-10-CM | POA: Diagnosis not present

## 2021-06-25 DIAGNOSIS — M109 Gout, unspecified: Secondary | ICD-10-CM | POA: Diagnosis not present

## 2021-06-25 DIAGNOSIS — R590 Localized enlarged lymph nodes: Secondary | ICD-10-CM | POA: Diagnosis not present

## 2021-06-25 DIAGNOSIS — Q600 Renal agenesis, unilateral: Secondary | ICD-10-CM | POA: Diagnosis not present

## 2021-06-25 NOTE — Telephone Encounter (Signed)
Left message for patient to call back and schedule Medicare Annual Wellness Visit (AWV) either virtually or in office.  Left both my jabber number [phone] and office number    Last AWV ;06/14/20 please schedule at anytime with [facility]    This should be a 45 minute visit.

## 2021-06-29 ENCOUNTER — Other Ambulatory Visit: Payer: Self-pay | Admitting: Nurse Practitioner

## 2021-06-29 DIAGNOSIS — M1A9XX0 Chronic gout, unspecified, without tophus (tophi): Secondary | ICD-10-CM

## 2021-06-29 DIAGNOSIS — I10 Essential (primary) hypertension: Secondary | ICD-10-CM

## 2021-07-02 DIAGNOSIS — Q600 Renal agenesis, unilateral: Secondary | ICD-10-CM | POA: Diagnosis not present

## 2021-07-02 DIAGNOSIS — Z96651 Presence of right artificial knee joint: Secondary | ICD-10-CM | POA: Diagnosis not present

## 2021-07-02 DIAGNOSIS — D62 Acute posthemorrhagic anemia: Secondary | ICD-10-CM | POA: Diagnosis not present

## 2021-07-02 DIAGNOSIS — I10 Essential (primary) hypertension: Secondary | ICD-10-CM | POA: Diagnosis not present

## 2021-07-02 DIAGNOSIS — R590 Localized enlarged lymph nodes: Secondary | ICD-10-CM | POA: Diagnosis not present

## 2021-07-02 DIAGNOSIS — M109 Gout, unspecified: Secondary | ICD-10-CM | POA: Diagnosis not present

## 2021-07-04 ENCOUNTER — Telehealth: Payer: Self-pay | Admitting: Nurse Practitioner

## 2021-07-04 NOTE — Telephone Encounter (Signed)
Received report for a CT scan of abdomen due to concerns about a SBO while hospitalized with [facility]. After review of her chart via Care Everywhere this has resolved. She has not been in to the office since 2020. I will have MA to call patient tomorrow to see if she is still a patient here.

## 2021-07-08 DIAGNOSIS — Z471 Aftercare following joint replacement surgery: Secondary | ICD-10-CM | POA: Diagnosis not present

## 2021-07-08 DIAGNOSIS — Z96651 Presence of right artificial knee joint: Secondary | ICD-10-CM | POA: Diagnosis not present

## 2021-07-08 DIAGNOSIS — T84018D Broken internal joint prosthesis, other site, subsequent encounter: Secondary | ICD-10-CM | POA: Diagnosis not present

## 2021-07-09 DIAGNOSIS — M109 Gout, unspecified: Secondary | ICD-10-CM | POA: Diagnosis not present

## 2021-07-09 DIAGNOSIS — Z96651 Presence of right artificial knee joint: Secondary | ICD-10-CM | POA: Diagnosis not present

## 2021-07-09 DIAGNOSIS — D62 Acute posthemorrhagic anemia: Secondary | ICD-10-CM | POA: Diagnosis not present

## 2021-07-09 DIAGNOSIS — Q600 Renal agenesis, unilateral: Secondary | ICD-10-CM | POA: Diagnosis not present

## 2021-07-09 DIAGNOSIS — I10 Essential (primary) hypertension: Secondary | ICD-10-CM | POA: Diagnosis not present

## 2021-07-09 DIAGNOSIS — R590 Localized enlarged lymph nodes: Secondary | ICD-10-CM | POA: Diagnosis not present

## 2021-07-15 ENCOUNTER — Telehealth: Payer: Self-pay

## 2021-07-15 ENCOUNTER — Telehealth: Payer: Self-pay | Admitting: Internal Medicine

## 2021-07-15 NOTE — Telephone Encounter (Signed)
Transition Care Management Follow-up Telephone Call Date of discharge and from where: 06/24/2021 [facility] and then went to rehab [facility] discharged 07/12/2021 How have you been since you were released from the hospital? Good Any questions or concerns? No  Items Reviewed: Did the pt receive and understand the discharge instructions provided? Yes  Medications obtained and verified? Yes  Other? No  Any new allergies since your discharge? No  Dietary orders reviewed? No Do you have support at home? Yes   Home Care and Equipment/Supplies: Were home health services ordered? not applicable If so, what is the name of the agency?   Has the agency set up a time to come to the patient's home? not applicable Were any new equipment or medical supplies ordered?  No What is the name of the medical supply agency?  Were you able to get the supplies/equipment? not applicable Do you have any questions related to the use of the equipment or supplies? No  Functional Questionnaire: (I = Independent and D = Dependent) ADLs: I  Bathing/Dressing- D  Meal Prep- I  Eating- D  Maintaining continence- D  Transferring/Ambulation- D  Managing Meds- D  Follow up appointments reviewed:  PCP Hospital f/u appt confirmed? The pt said that she has to call back to schedule her hospital f/u.   [facility] f/u appt confirmed? Are transportation arrangements needed? No  If their condition worsens, is the pt aware to call PCP or go to the Emergency Dept.? Yes Was the patient provided with contact information for the PCP's office or ED? Yes Was to pt encouraged to call back with questions or concerns? Yes

## 2021-07-15 NOTE — Telephone Encounter (Signed)
Left message for patient to call back and schedule Medicare Annual Wellness Visit (AWV) either virtually or in office.  Left both my jabber number [phone] and office number    Last AWV 06/14/20  please schedule at anytime with [facility]    This should be a 45 minute visit.

## 2021-07-15 NOTE — Telephone Encounter (Signed)
Left the pt a message to call the office back so that I can see if she is still a patient and if so to schedule her an appt for a f/u.

## 2021-07-16 DIAGNOSIS — Z48815 Encounter for surgical aftercare following surgery on the digestive system: Secondary | ICD-10-CM | POA: Diagnosis not present

## 2021-07-23 DIAGNOSIS — Z905 Acquired absence of kidney: Secondary | ICD-10-CM | POA: Diagnosis not present

## 2021-07-23 DIAGNOSIS — E278 Other specified disorders of adrenal gland: Secondary | ICD-10-CM | POA: Diagnosis not present

## 2021-07-24 DIAGNOSIS — Z96651 Presence of right artificial knee joint: Secondary | ICD-10-CM | POA: Diagnosis not present

## 2021-07-30 DIAGNOSIS — Z7409 Other reduced mobility: Secondary | ICD-10-CM | POA: Diagnosis not present

## 2021-07-30 DIAGNOSIS — I9581 Postprocedural hypotension: Secondary | ICD-10-CM | POA: Diagnosis not present

## 2021-08-07 DIAGNOSIS — Z7409 Other reduced mobility: Secondary | ICD-10-CM | POA: Diagnosis not present

## 2021-08-07 DIAGNOSIS — Z905 Acquired absence of kidney: Secondary | ICD-10-CM | POA: Diagnosis not present

## 2021-08-07 DIAGNOSIS — I9581 Postprocedural hypotension: Secondary | ICD-10-CM | POA: Diagnosis not present

## 2021-08-07 DIAGNOSIS — G8929 Other chronic pain: Secondary | ICD-10-CM | POA: Diagnosis not present

## 2021-08-07 DIAGNOSIS — R531 Weakness: Secondary | ICD-10-CM | POA: Diagnosis not present

## 2021-08-07 DIAGNOSIS — E278 Other specified disorders of adrenal gland: Secondary | ICD-10-CM | POA: Diagnosis not present

## 2021-08-07 DIAGNOSIS — M25561 Pain in right knee: Secondary | ICD-10-CM | POA: Diagnosis not present

## 2021-08-07 DIAGNOSIS — M25562 Pain in left knee: Secondary | ICD-10-CM | POA: Diagnosis not present

## 2021-08-08 DIAGNOSIS — E278 Other specified disorders of adrenal gland: Secondary | ICD-10-CM | POA: Diagnosis not present

## 2021-08-08 DIAGNOSIS — Q600 Renal agenesis, unilateral: Secondary | ICD-10-CM | POA: Diagnosis not present

## 2021-08-08 DIAGNOSIS — N289 Disorder of kidney and ureter, unspecified: Secondary | ICD-10-CM | POA: Diagnosis not present

## 2021-08-08 DIAGNOSIS — E279 Disorder of adrenal gland, unspecified: Secondary | ICD-10-CM | POA: Diagnosis not present

## 2021-08-12 ENCOUNTER — Telehealth: Payer: Self-pay | Admitting: Internal Medicine

## 2021-08-12 NOTE — Telephone Encounter (Signed)
Left message for patient to call back and schedule Medicare Annual Wellness Visit (AWV) either virtually or in office.  Left both my jabber number [phone] and office number    Last AWV 06/14/20 please schedule at anytime with [facility]    This should be a 45 minute visit.

## 2021-08-13 DIAGNOSIS — I9581 Postprocedural hypotension: Secondary | ICD-10-CM | POA: Diagnosis not present

## 2021-08-13 DIAGNOSIS — Z7409 Other reduced mobility: Secondary | ICD-10-CM | POA: Diagnosis not present

## 2021-08-22 DIAGNOSIS — I9581 Postprocedural hypotension: Secondary | ICD-10-CM | POA: Diagnosis not present

## 2021-08-22 DIAGNOSIS — Z7409 Other reduced mobility: Secondary | ICD-10-CM | POA: Diagnosis not present

## 2021-08-27 DIAGNOSIS — R920 Mammographic microcalcification found on diagnostic imaging of breast: Secondary | ICD-10-CM | POA: Diagnosis not present

## 2021-08-27 DIAGNOSIS — R922 Inconclusive mammogram: Secondary | ICD-10-CM | POA: Diagnosis not present

## 2021-09-04 ENCOUNTER — Telehealth: Payer: Self-pay

## 2021-09-04 NOTE — Telephone Encounter (Signed)
Confirming with pt if she is still a pt here at [facility]. No answer, lvm for pt to give the office a call back. Ext [phone].

## 2021-09-05 DIAGNOSIS — I9581 Postprocedural hypotension: Secondary | ICD-10-CM | POA: Diagnosis not present

## 2021-09-12 DIAGNOSIS — J209 Acute bronchitis, unspecified: Secondary | ICD-10-CM | POA: Diagnosis not present

## 2021-10-03 DIAGNOSIS — R928 Other abnormal and inconclusive findings on diagnostic imaging of breast: Secondary | ICD-10-CM | POA: Diagnosis not present

## 2021-10-03 DIAGNOSIS — R921 Mammographic calcification found on diagnostic imaging of breast: Secondary | ICD-10-CM | POA: Diagnosis not present

## 2021-10-03 DIAGNOSIS — N6489 Other specified disorders of breast: Secondary | ICD-10-CM | POA: Diagnosis not present

## 2021-10-06 ENCOUNTER — Other Ambulatory Visit: Payer: Self-pay | Admitting: Internal Medicine

## 2021-10-10 DIAGNOSIS — E278 Other specified disorders of adrenal gland: Secondary | ICD-10-CM | POA: Diagnosis not present

## 2021-10-10 DIAGNOSIS — E279 Disorder of adrenal gland, unspecified: Secondary | ICD-10-CM | POA: Diagnosis not present

## 2021-10-14 DIAGNOSIS — M61562 Other ossification of muscle, left lower leg: Secondary | ICD-10-CM | POA: Diagnosis not present

## 2021-10-14 DIAGNOSIS — Z471 Aftercare following joint replacement surgery: Secondary | ICD-10-CM | POA: Diagnosis not present

## 2021-10-14 DIAGNOSIS — Z96651 Presence of right artificial knee joint: Secondary | ICD-10-CM | POA: Diagnosis not present

## 2021-10-14 DIAGNOSIS — T84018D Broken internal joint prosthesis, other site, subsequent encounter: Secondary | ICD-10-CM | POA: Diagnosis not present

## 2021-10-14 DIAGNOSIS — M85861 Other specified disorders of bone density and structure, right lower leg: Secondary | ICD-10-CM | POA: Diagnosis not present

## 2021-10-15 ENCOUNTER — Telehealth: Payer: Self-pay

## 2021-10-15 NOTE — Telephone Encounter (Signed)
Lvm. Pt has not been seen in 3 years. Removed PCP.

## 2021-10-18 DIAGNOSIS — Z96659 Presence of unspecified artificial knee joint: Secondary | ICD-10-CM | POA: Diagnosis not present

## 2021-10-18 DIAGNOSIS — E54 Ascorbic acid deficiency: Secondary | ICD-10-CM | POA: Diagnosis not present

## 2021-10-18 DIAGNOSIS — I10 Essential (primary) hypertension: Secondary | ICD-10-CM | POA: Diagnosis not present

## 2021-10-18 DIAGNOSIS — Q600 Renal agenesis, unilateral: Secondary | ICD-10-CM | POA: Diagnosis not present

## 2021-10-18 DIAGNOSIS — Z0000 Encounter for general adult medical examination without abnormal findings: Secondary | ICD-10-CM | POA: Diagnosis not present

## 2021-10-18 DIAGNOSIS — T84018A Broken internal joint prosthesis, other site, initial encounter: Secondary | ICD-10-CM | POA: Diagnosis not present

## 2021-10-18 DIAGNOSIS — Z6837 Body mass index (BMI) 37.0-37.9, adult: Secondary | ICD-10-CM | POA: Diagnosis not present

## 2021-10-18 DIAGNOSIS — K5900 Constipation, unspecified: Secondary | ICD-10-CM | POA: Diagnosis not present

## 2021-10-18 DIAGNOSIS — M109 Gout, unspecified: Secondary | ICD-10-CM | POA: Diagnosis not present

## 2021-10-31 DIAGNOSIS — R9431 Abnormal electrocardiogram [ECG] [EKG]: Secondary | ICD-10-CM | POA: Diagnosis not present

## 2021-10-31 DIAGNOSIS — R001 Bradycardia, unspecified: Secondary | ICD-10-CM | POA: Diagnosis not present

## 2021-10-31 DIAGNOSIS — E278 Other specified disorders of adrenal gland: Secondary | ICD-10-CM | POA: Diagnosis not present

## 2021-10-31 DIAGNOSIS — Z01818 Encounter for other preprocedural examination: Secondary | ICD-10-CM | POA: Diagnosis not present

## 2021-11-06 DIAGNOSIS — K660 Peritoneal adhesions (postprocedural) (postinfection): Secondary | ICD-10-CM | POA: Diagnosis not present

## 2021-11-06 DIAGNOSIS — Z751 Person awaiting admission to adequate facility elsewhere: Secondary | ICD-10-CM | POA: Diagnosis not present

## 2021-11-06 DIAGNOSIS — Z79899 Other long term (current) drug therapy: Secondary | ICD-10-CM | POA: Diagnosis not present

## 2021-11-06 DIAGNOSIS — R278 Other lack of coordination: Secondary | ICD-10-CM | POA: Diagnosis not present

## 2021-11-06 DIAGNOSIS — D3502 Benign neoplasm of left adrenal gland: Secondary | ICD-10-CM | POA: Diagnosis not present

## 2021-11-06 DIAGNOSIS — Z743 Need for continuous supervision: Secondary | ICD-10-CM | POA: Diagnosis not present

## 2021-11-06 DIAGNOSIS — E039 Hypothyroidism, unspecified: Secondary | ICD-10-CM | POA: Diagnosis not present

## 2021-11-06 DIAGNOSIS — M109 Gout, unspecified: Secondary | ICD-10-CM | POA: Diagnosis not present

## 2021-11-06 DIAGNOSIS — Z9089 Acquired absence of other organs: Secondary | ICD-10-CM | POA: Diagnosis not present

## 2021-11-06 DIAGNOSIS — Z905 Acquired absence of kidney: Secondary | ICD-10-CM | POA: Diagnosis not present

## 2021-11-06 DIAGNOSIS — M6281 Muscle weakness (generalized): Secondary | ICD-10-CM | POA: Diagnosis not present

## 2021-11-06 DIAGNOSIS — R262 Difficulty in walking, not elsewhere classified: Secondary | ICD-10-CM | POA: Diagnosis not present

## 2021-11-06 DIAGNOSIS — E278 Other specified disorders of adrenal gland: Secondary | ICD-10-CM | POA: Diagnosis not present

## 2021-11-06 DIAGNOSIS — M1990 Unspecified osteoarthritis, unspecified site: Secondary | ICD-10-CM | POA: Diagnosis not present

## 2021-11-06 DIAGNOSIS — I9581 Postprocedural hypotension: Secondary | ICD-10-CM | POA: Diagnosis not present

## 2021-11-06 DIAGNOSIS — I10 Essential (primary) hypertension: Secondary | ICD-10-CM | POA: Diagnosis not present

## 2021-11-06 DIAGNOSIS — Z48816 Encounter for surgical aftercare following surgery on the genitourinary system: Secondary | ICD-10-CM | POA: Diagnosis not present

## 2021-11-06 DIAGNOSIS — D509 Iron deficiency anemia, unspecified: Secondary | ICD-10-CM | POA: Diagnosis not present

## 2021-11-06 DIAGNOSIS — Z5331 Laparoscopic surgical procedure converted to open procedure: Secondary | ICD-10-CM | POA: Diagnosis not present

## 2021-11-06 DIAGNOSIS — R531 Weakness: Secondary | ICD-10-CM | POA: Diagnosis not present

## 2021-11-06 DIAGNOSIS — G8929 Other chronic pain: Secondary | ICD-10-CM | POA: Diagnosis not present

## 2021-11-06 DIAGNOSIS — I351 Nonrheumatic aortic (valve) insufficiency: Secondary | ICD-10-CM | POA: Diagnosis not present

## 2021-11-06 DIAGNOSIS — T84093D Other mechanical complication of internal left knee prosthesis, subsequent encounter: Secondary | ICD-10-CM | POA: Diagnosis not present

## 2021-11-06 HISTORY — DX: Benign neoplasm of left adrenal gland: D35.02

## 2021-11-06 HISTORY — PX: ADRENALECTOMY: SHX876

## 2021-11-07 DIAGNOSIS — E278 Other specified disorders of adrenal gland: Secondary | ICD-10-CM | POA: Diagnosis not present

## 2021-11-13 DIAGNOSIS — R5381 Other malaise: Secondary | ICD-10-CM | POA: Diagnosis not present

## 2021-11-13 DIAGNOSIS — Z48816 Encounter for surgical aftercare following surgery on the genitourinary system: Secondary | ICD-10-CM | POA: Diagnosis not present

## 2021-11-13 DIAGNOSIS — M6281 Muscle weakness (generalized): Secondary | ICD-10-CM | POA: Diagnosis not present

## 2021-11-13 DIAGNOSIS — T84093D Other mechanical complication of internal left knee prosthesis, subsequent encounter: Secondary | ICD-10-CM | POA: Diagnosis not present

## 2021-11-13 DIAGNOSIS — R278 Other lack of coordination: Secondary | ICD-10-CM | POA: Diagnosis not present

## 2021-11-13 DIAGNOSIS — E039 Hypothyroidism, unspecified: Secondary | ICD-10-CM | POA: Diagnosis not present

## 2021-11-13 DIAGNOSIS — M1A9XX0 Chronic gout, unspecified, without tophus (tophi): Secondary | ICD-10-CM | POA: Diagnosis not present

## 2021-11-13 DIAGNOSIS — Z9089 Acquired absence of other organs: Secondary | ICD-10-CM | POA: Diagnosis not present

## 2021-11-13 DIAGNOSIS — Z743 Need for continuous supervision: Secondary | ICD-10-CM | POA: Diagnosis not present

## 2021-11-13 DIAGNOSIS — E896 Postprocedural adrenocortical (-medullary) hypofunction: Secondary | ICD-10-CM | POA: Diagnosis not present

## 2021-11-13 DIAGNOSIS — M1990 Unspecified osteoarthritis, unspecified site: Secondary | ICD-10-CM | POA: Diagnosis not present

## 2021-11-13 DIAGNOSIS — G8929 Other chronic pain: Secondary | ICD-10-CM | POA: Diagnosis not present

## 2021-11-13 DIAGNOSIS — Z905 Acquired absence of kidney: Secondary | ICD-10-CM | POA: Diagnosis not present

## 2021-11-13 DIAGNOSIS — D62 Acute posthemorrhagic anemia: Secondary | ICD-10-CM | POA: Diagnosis not present

## 2021-11-13 DIAGNOSIS — M109 Gout, unspecified: Secondary | ICD-10-CM | POA: Diagnosis not present

## 2021-11-13 DIAGNOSIS — R262 Difficulty in walking, not elsewhere classified: Secondary | ICD-10-CM | POA: Diagnosis not present

## 2021-11-13 DIAGNOSIS — Z66 Do not resuscitate: Secondary | ICD-10-CM | POA: Diagnosis not present

## 2021-11-13 DIAGNOSIS — I10 Essential (primary) hypertension: Secondary | ICD-10-CM | POA: Diagnosis not present

## 2021-11-13 DIAGNOSIS — R531 Weakness: Secondary | ICD-10-CM | POA: Diagnosis not present

## 2021-11-13 DIAGNOSIS — I9581 Postprocedural hypotension: Secondary | ICD-10-CM | POA: Diagnosis not present

## 2021-11-13 DIAGNOSIS — I351 Nonrheumatic aortic (valve) insufficiency: Secondary | ICD-10-CM | POA: Diagnosis not present

## 2021-11-13 DIAGNOSIS — D509 Iron deficiency anemia, unspecified: Secondary | ICD-10-CM | POA: Diagnosis not present

## 2021-11-14 DIAGNOSIS — D62 Acute posthemorrhagic anemia: Secondary | ICD-10-CM | POA: Diagnosis not present

## 2021-11-14 DIAGNOSIS — R5381 Other malaise: Secondary | ICD-10-CM | POA: Diagnosis not present

## 2021-11-14 DIAGNOSIS — I10 Essential (primary) hypertension: Secondary | ICD-10-CM | POA: Diagnosis not present

## 2021-11-14 DIAGNOSIS — E896 Postprocedural adrenocortical (-medullary) hypofunction: Secondary | ICD-10-CM | POA: Diagnosis not present

## 2021-11-14 DIAGNOSIS — Z66 Do not resuscitate: Secondary | ICD-10-CM | POA: Diagnosis not present

## 2021-11-14 DIAGNOSIS — M1A9XX0 Chronic gout, unspecified, without tophus (tophi): Secondary | ICD-10-CM | POA: Diagnosis not present

## 2021-11-14 DIAGNOSIS — Z905 Acquired absence of kidney: Secondary | ICD-10-CM | POA: Diagnosis not present

## 2021-11-19 DIAGNOSIS — M6281 Muscle weakness (generalized): Secondary | ICD-10-CM | POA: Diagnosis not present

## 2021-11-26 DIAGNOSIS — D509 Iron deficiency anemia, unspecified: Secondary | ICD-10-CM | POA: Diagnosis not present

## 2021-11-30 DIAGNOSIS — Z6834 Body mass index (BMI) 34.0-34.9, adult: Secondary | ICD-10-CM | POA: Diagnosis not present

## 2021-11-30 DIAGNOSIS — I10 Essential (primary) hypertension: Secondary | ICD-10-CM | POA: Diagnosis not present

## 2021-11-30 DIAGNOSIS — D630 Anemia in neoplastic disease: Secondary | ICD-10-CM | POA: Diagnosis not present

## 2021-11-30 DIAGNOSIS — M1990 Unspecified osteoarthritis, unspecified site: Secondary | ICD-10-CM | POA: Diagnosis not present

## 2021-11-30 DIAGNOSIS — D3502 Benign neoplasm of left adrenal gland: Secondary | ICD-10-CM | POA: Diagnosis not present

## 2021-11-30 DIAGNOSIS — G8929 Other chronic pain: Secondary | ICD-10-CM | POA: Diagnosis not present

## 2021-11-30 DIAGNOSIS — D500 Iron deficiency anemia secondary to blood loss (chronic): Secondary | ICD-10-CM | POA: Diagnosis not present

## 2021-11-30 DIAGNOSIS — K5900 Constipation, unspecified: Secondary | ICD-10-CM | POA: Diagnosis not present

## 2021-11-30 DIAGNOSIS — N281 Cyst of kidney, acquired: Secondary | ICD-10-CM | POA: Diagnosis not present

## 2021-11-30 DIAGNOSIS — Z96653 Presence of artificial knee joint, bilateral: Secondary | ICD-10-CM | POA: Diagnosis not present

## 2021-11-30 DIAGNOSIS — E669 Obesity, unspecified: Secondary | ICD-10-CM | POA: Diagnosis not present

## 2021-11-30 DIAGNOSIS — Z483 Aftercare following surgery for neoplasm: Secondary | ICD-10-CM | POA: Diagnosis not present

## 2021-11-30 DIAGNOSIS — M1A9XX0 Chronic gout, unspecified, without tophus (tophi): Secondary | ICD-10-CM | POA: Diagnosis not present

## 2021-11-30 DIAGNOSIS — I351 Nonrheumatic aortic (valve) insufficiency: Secondary | ICD-10-CM | POA: Diagnosis not present

## 2021-11-30 DIAGNOSIS — Z7982 Long term (current) use of aspirin: Secondary | ICD-10-CM | POA: Diagnosis not present

## 2021-12-04 DIAGNOSIS — Z483 Aftercare following surgery for neoplasm: Secondary | ICD-10-CM | POA: Diagnosis not present

## 2021-12-04 DIAGNOSIS — I10 Essential (primary) hypertension: Secondary | ICD-10-CM | POA: Diagnosis not present

## 2021-12-04 DIAGNOSIS — G8929 Other chronic pain: Secondary | ICD-10-CM | POA: Diagnosis not present

## 2021-12-04 DIAGNOSIS — D3502 Benign neoplasm of left adrenal gland: Secondary | ICD-10-CM | POA: Diagnosis not present

## 2021-12-04 DIAGNOSIS — M1A9XX0 Chronic gout, unspecified, without tophus (tophi): Secondary | ICD-10-CM | POA: Diagnosis not present

## 2021-12-04 DIAGNOSIS — D630 Anemia in neoplastic disease: Secondary | ICD-10-CM | POA: Diagnosis not present

## 2021-12-05 DIAGNOSIS — Z9089 Acquired absence of other organs: Secondary | ICD-10-CM | POA: Diagnosis not present

## 2021-12-05 DIAGNOSIS — Z48816 Encounter for surgical aftercare following surgery on the genitourinary system: Secondary | ICD-10-CM | POA: Diagnosis not present

## 2021-12-10 DIAGNOSIS — D3502 Benign neoplasm of left adrenal gland: Secondary | ICD-10-CM | POA: Diagnosis not present

## 2021-12-10 DIAGNOSIS — D630 Anemia in neoplastic disease: Secondary | ICD-10-CM | POA: Diagnosis not present

## 2021-12-10 DIAGNOSIS — Z483 Aftercare following surgery for neoplasm: Secondary | ICD-10-CM | POA: Diagnosis not present

## 2021-12-10 DIAGNOSIS — G8929 Other chronic pain: Secondary | ICD-10-CM | POA: Diagnosis not present

## 2021-12-10 DIAGNOSIS — M1A9XX0 Chronic gout, unspecified, without tophus (tophi): Secondary | ICD-10-CM | POA: Diagnosis not present

## 2021-12-10 DIAGNOSIS — I10 Essential (primary) hypertension: Secondary | ICD-10-CM | POA: Diagnosis not present

## 2021-12-12 DIAGNOSIS — I10 Essential (primary) hypertension: Secondary | ICD-10-CM | POA: Diagnosis not present

## 2021-12-12 DIAGNOSIS — D630 Anemia in neoplastic disease: Secondary | ICD-10-CM | POA: Diagnosis not present

## 2021-12-12 DIAGNOSIS — D3502 Benign neoplasm of left adrenal gland: Secondary | ICD-10-CM | POA: Diagnosis not present

## 2021-12-12 DIAGNOSIS — Z483 Aftercare following surgery for neoplasm: Secondary | ICD-10-CM | POA: Diagnosis not present

## 2021-12-12 DIAGNOSIS — G8929 Other chronic pain: Secondary | ICD-10-CM | POA: Diagnosis not present

## 2021-12-12 DIAGNOSIS — M1A9XX0 Chronic gout, unspecified, without tophus (tophi): Secondary | ICD-10-CM | POA: Diagnosis not present

## 2021-12-13 DIAGNOSIS — I10 Essential (primary) hypertension: Secondary | ICD-10-CM | POA: Diagnosis not present

## 2021-12-13 DIAGNOSIS — D3502 Benign neoplasm of left adrenal gland: Secondary | ICD-10-CM | POA: Diagnosis not present

## 2021-12-13 DIAGNOSIS — M1990 Unspecified osteoarthritis, unspecified site: Secondary | ICD-10-CM | POA: Diagnosis not present

## 2021-12-13 DIAGNOSIS — E669 Obesity, unspecified: Secondary | ICD-10-CM | POA: Diagnosis not present

## 2021-12-13 DIAGNOSIS — Z6834 Body mass index (BMI) 34.0-34.9, adult: Secondary | ICD-10-CM | POA: Diagnosis not present

## 2021-12-13 DIAGNOSIS — N281 Cyst of kidney, acquired: Secondary | ICD-10-CM | POA: Diagnosis not present

## 2021-12-13 DIAGNOSIS — I351 Nonrheumatic aortic (valve) insufficiency: Secondary | ICD-10-CM | POA: Diagnosis not present

## 2021-12-13 DIAGNOSIS — D630 Anemia in neoplastic disease: Secondary | ICD-10-CM | POA: Diagnosis not present

## 2021-12-13 DIAGNOSIS — G8929 Other chronic pain: Secondary | ICD-10-CM | POA: Diagnosis not present

## 2021-12-13 DIAGNOSIS — K5900 Constipation, unspecified: Secondary | ICD-10-CM | POA: Diagnosis not present

## 2021-12-13 DIAGNOSIS — D500 Iron deficiency anemia secondary to blood loss (chronic): Secondary | ICD-10-CM | POA: Diagnosis not present

## 2021-12-13 DIAGNOSIS — M1A9XX0 Chronic gout, unspecified, without tophus (tophi): Secondary | ICD-10-CM | POA: Diagnosis not present

## 2022-01-05 ENCOUNTER — Other Ambulatory Visit: Payer: Self-pay | Admitting: Nurse Practitioner

## 2022-01-05 DIAGNOSIS — I10 Essential (primary) hypertension: Secondary | ICD-10-CM

## 2022-01-05 DIAGNOSIS — M1A9XX0 Chronic gout, unspecified, without tophus (tophi): Secondary | ICD-10-CM

## 2022-01-10 DIAGNOSIS — K5900 Constipation, unspecified: Secondary | ICD-10-CM | POA: Diagnosis not present

## 2022-01-10 DIAGNOSIS — Z79899 Other long term (current) drug therapy: Secondary | ICD-10-CM | POA: Diagnosis not present

## 2022-01-10 DIAGNOSIS — R011 Cardiac murmur, unspecified: Secondary | ICD-10-CM | POA: Diagnosis not present

## 2022-01-10 DIAGNOSIS — Z96659 Presence of unspecified artificial knee joint: Secondary | ICD-10-CM | POA: Diagnosis not present

## 2022-01-10 DIAGNOSIS — R600 Localized edema: Secondary | ICD-10-CM | POA: Diagnosis not present

## 2022-01-10 DIAGNOSIS — Z0001 Encounter for general adult medical examination with abnormal findings: Secondary | ICD-10-CM | POA: Diagnosis not present

## 2022-01-10 DIAGNOSIS — I10 Essential (primary) hypertension: Secondary | ICD-10-CM | POA: Diagnosis not present

## 2022-01-10 DIAGNOSIS — E54 Ascorbic acid deficiency: Secondary | ICD-10-CM | POA: Diagnosis not present

## 2022-01-10 DIAGNOSIS — T84018A Broken internal joint prosthesis, other site, initial encounter: Secondary | ICD-10-CM | POA: Diagnosis not present

## 2022-01-10 DIAGNOSIS — I739 Peripheral vascular disease, unspecified: Secondary | ICD-10-CM | POA: Diagnosis not present

## 2022-01-13 DIAGNOSIS — Z01818 Encounter for other preprocedural examination: Secondary | ICD-10-CM | POA: Diagnosis not present

## 2022-01-13 DIAGNOSIS — R799 Abnormal finding of blood chemistry, unspecified: Secondary | ICD-10-CM | POA: Diagnosis not present

## 2022-01-13 DIAGNOSIS — T84093D Other mechanical complication of internal left knee prosthesis, subsequent encounter: Secondary | ICD-10-CM | POA: Diagnosis not present

## 2022-01-14 DIAGNOSIS — K5900 Constipation, unspecified: Secondary | ICD-10-CM | POA: Diagnosis not present

## 2022-01-14 DIAGNOSIS — I351 Nonrheumatic aortic (valve) insufficiency: Secondary | ICD-10-CM | POA: Diagnosis not present

## 2022-01-14 DIAGNOSIS — I10 Essential (primary) hypertension: Secondary | ICD-10-CM | POA: Diagnosis not present

## 2022-01-14 DIAGNOSIS — M1990 Unspecified osteoarthritis, unspecified site: Secondary | ICD-10-CM | POA: Diagnosis not present

## 2022-01-14 DIAGNOSIS — D500 Iron deficiency anemia secondary to blood loss (chronic): Secondary | ICD-10-CM | POA: Diagnosis not present

## 2022-01-14 DIAGNOSIS — G8929 Other chronic pain: Secondary | ICD-10-CM | POA: Diagnosis not present

## 2022-01-14 DIAGNOSIS — D630 Anemia in neoplastic disease: Secondary | ICD-10-CM | POA: Diagnosis not present

## 2022-01-14 DIAGNOSIS — N281 Cyst of kidney, acquired: Secondary | ICD-10-CM | POA: Diagnosis not present

## 2022-01-14 DIAGNOSIS — M1A9XX0 Chronic gout, unspecified, without tophus (tophi): Secondary | ICD-10-CM | POA: Diagnosis not present

## 2022-01-14 DIAGNOSIS — E669 Obesity, unspecified: Secondary | ICD-10-CM | POA: Diagnosis not present

## 2022-01-14 DIAGNOSIS — D3502 Benign neoplasm of left adrenal gland: Secondary | ICD-10-CM | POA: Diagnosis not present

## 2022-01-14 DIAGNOSIS — Z483 Aftercare following surgery for neoplasm: Secondary | ICD-10-CM | POA: Diagnosis not present

## 2022-01-16 DIAGNOSIS — Z01812 Encounter for preprocedural laboratory examination: Secondary | ICD-10-CM | POA: Diagnosis not present

## 2022-01-16 DIAGNOSIS — T84093D Other mechanical complication of internal left knee prosthesis, subsequent encounter: Secondary | ICD-10-CM | POA: Diagnosis not present

## 2022-01-16 DIAGNOSIS — D649 Anemia, unspecified: Secondary | ICD-10-CM | POA: Diagnosis not present

## 2022-01-17 DIAGNOSIS — D509 Iron deficiency anemia, unspecified: Secondary | ICD-10-CM | POA: Insufficient documentation

## 2022-01-22 DIAGNOSIS — M25662 Stiffness of left knee, not elsewhere classified: Secondary | ICD-10-CM | POA: Diagnosis not present

## 2022-01-22 DIAGNOSIS — Z789 Other specified health status: Secondary | ICD-10-CM | POA: Diagnosis not present

## 2022-01-22 DIAGNOSIS — T84093D Other mechanical complication of internal left knee prosthesis, subsequent encounter: Secondary | ICD-10-CM | POA: Diagnosis not present

## 2022-01-22 DIAGNOSIS — Z7409 Other reduced mobility: Secondary | ICD-10-CM | POA: Diagnosis not present

## 2022-01-22 DIAGNOSIS — R531 Weakness: Secondary | ICD-10-CM | POA: Diagnosis not present

## 2022-01-23 DIAGNOSIS — D509 Iron deficiency anemia, unspecified: Secondary | ICD-10-CM | POA: Diagnosis not present

## 2022-01-30 DIAGNOSIS — D509 Iron deficiency anemia, unspecified: Secondary | ICD-10-CM | POA: Diagnosis not present

## 2022-02-24 DIAGNOSIS — R300 Dysuria: Secondary | ICD-10-CM | POA: Diagnosis not present

## 2022-03-04 DIAGNOSIS — Z905 Acquired absence of kidney: Secondary | ICD-10-CM | POA: Diagnosis not present

## 2022-03-04 DIAGNOSIS — M6281 Muscle weakness (generalized): Secondary | ICD-10-CM | POA: Diagnosis not present

## 2022-03-04 DIAGNOSIS — R262 Difficulty in walking, not elsewhere classified: Secondary | ICD-10-CM | POA: Diagnosis not present

## 2022-03-04 DIAGNOSIS — T84033A Mechanical loosening of internal left knee prosthetic joint, initial encounter: Secondary | ICD-10-CM | POA: Diagnosis not present

## 2022-03-04 DIAGNOSIS — Z472 Encounter for removal of internal fixation device: Secondary | ICD-10-CM | POA: Diagnosis not present

## 2022-03-04 DIAGNOSIS — Z79899 Other long term (current) drug therapy: Secondary | ICD-10-CM | POA: Diagnosis not present

## 2022-03-04 DIAGNOSIS — Z96651 Presence of right artificial knee joint: Secondary | ICD-10-CM | POA: Diagnosis not present

## 2022-03-04 DIAGNOSIS — Z96653 Presence of artificial knee joint, bilateral: Secondary | ICD-10-CM | POA: Diagnosis not present

## 2022-03-04 DIAGNOSIS — Z743 Need for continuous supervision: Secondary | ICD-10-CM | POA: Diagnosis not present

## 2022-03-04 DIAGNOSIS — R54 Age-related physical debility: Secondary | ICD-10-CM | POA: Diagnosis not present

## 2022-03-04 DIAGNOSIS — I10 Essential (primary) hypertension: Secondary | ICD-10-CM | POA: Diagnosis not present

## 2022-03-04 DIAGNOSIS — G8918 Other acute postprocedural pain: Secondary | ICD-10-CM | POA: Diagnosis not present

## 2022-03-04 DIAGNOSIS — Q600 Renal agenesis, unilateral: Secondary | ICD-10-CM | POA: Diagnosis not present

## 2022-03-04 DIAGNOSIS — L0390 Cellulitis, unspecified: Secondary | ICD-10-CM | POA: Diagnosis not present

## 2022-03-04 DIAGNOSIS — G8929 Other chronic pain: Secondary | ICD-10-CM | POA: Diagnosis not present

## 2022-03-04 DIAGNOSIS — Z471 Aftercare following joint replacement surgery: Secondary | ICD-10-CM | POA: Diagnosis not present

## 2022-03-04 DIAGNOSIS — M1990 Unspecified osteoarthritis, unspecified site: Secondary | ICD-10-CM | POA: Diagnosis not present

## 2022-03-04 DIAGNOSIS — M25569 Pain in unspecified knee: Secondary | ICD-10-CM | POA: Diagnosis not present

## 2022-03-04 DIAGNOSIS — T8482XA Fibrosis due to internal orthopedic prosthetic devices, implants and grafts, initial encounter: Secondary | ICD-10-CM | POA: Diagnosis not present

## 2022-03-04 DIAGNOSIS — D509 Iron deficiency anemia, unspecified: Secondary | ICD-10-CM | POA: Diagnosis not present

## 2022-03-04 DIAGNOSIS — Z96652 Presence of left artificial knee joint: Secondary | ICD-10-CM | POA: Diagnosis not present

## 2022-03-04 DIAGNOSIS — Z96659 Presence of unspecified artificial knee joint: Secondary | ICD-10-CM | POA: Diagnosis not present

## 2022-03-04 DIAGNOSIS — T8484XA Pain due to internal orthopedic prosthetic devices, implants and grafts, initial encounter: Secondary | ICD-10-CM | POA: Diagnosis not present

## 2022-03-04 DIAGNOSIS — T84093D Other mechanical complication of internal left knee prosthesis, subsequent encounter: Secondary | ICD-10-CM | POA: Diagnosis not present

## 2022-03-04 DIAGNOSIS — R457 State of emotional shock and stress, unspecified: Secondary | ICD-10-CM | POA: Diagnosis not present

## 2022-03-04 DIAGNOSIS — R2681 Unsteadiness on feet: Secondary | ICD-10-CM | POA: Diagnosis not present

## 2022-03-04 DIAGNOSIS — T84018D Broken internal joint prosthesis, other site, subsequent encounter: Secondary | ICD-10-CM | POA: Diagnosis not present

## 2022-03-04 DIAGNOSIS — I351 Nonrheumatic aortic (valve) insufficiency: Secondary | ICD-10-CM | POA: Diagnosis not present

## 2022-03-04 DIAGNOSIS — W19XXXA Unspecified fall, initial encounter: Secondary | ICD-10-CM | POA: Diagnosis not present

## 2022-03-04 DIAGNOSIS — M109 Gout, unspecified: Secondary | ICD-10-CM | POA: Diagnosis not present

## 2022-03-04 DIAGNOSIS — I959 Hypotension, unspecified: Secondary | ICD-10-CM | POA: Diagnosis not present

## 2022-03-04 DIAGNOSIS — Z7982 Long term (current) use of aspirin: Secondary | ICD-10-CM | POA: Diagnosis not present

## 2022-03-04 DIAGNOSIS — K219 Gastro-esophageal reflux disease without esophagitis: Secondary | ICD-10-CM | POA: Diagnosis not present

## 2022-03-05 DIAGNOSIS — M25569 Pain in unspecified knee: Secondary | ICD-10-CM | POA: Diagnosis not present

## 2022-03-07 DIAGNOSIS — W19XXXA Unspecified fall, initial encounter: Secondary | ICD-10-CM | POA: Diagnosis not present

## 2022-03-07 DIAGNOSIS — L0390 Cellulitis, unspecified: Secondary | ICD-10-CM | POA: Diagnosis not present

## 2022-03-07 DIAGNOSIS — R2681 Unsteadiness on feet: Secondary | ICD-10-CM | POA: Diagnosis not present

## 2022-03-07 DIAGNOSIS — I959 Hypotension, unspecified: Secondary | ICD-10-CM | POA: Diagnosis not present

## 2022-03-07 DIAGNOSIS — T84093D Other mechanical complication of internal left knee prosthesis, subsequent encounter: Secondary | ICD-10-CM | POA: Diagnosis not present

## 2022-03-07 DIAGNOSIS — Z96652 Presence of left artificial knee joint: Secondary | ICD-10-CM | POA: Diagnosis not present

## 2022-03-07 DIAGNOSIS — M1990 Unspecified osteoarthritis, unspecified site: Secondary | ICD-10-CM | POA: Diagnosis not present

## 2022-03-07 DIAGNOSIS — I10 Essential (primary) hypertension: Secondary | ICD-10-CM | POA: Diagnosis not present

## 2022-03-07 DIAGNOSIS — R031 Nonspecific low blood-pressure reading: Secondary | ICD-10-CM | POA: Diagnosis not present

## 2022-03-07 DIAGNOSIS — K219 Gastro-esophageal reflux disease without esophagitis: Secondary | ICD-10-CM | POA: Diagnosis not present

## 2022-03-07 DIAGNOSIS — R262 Difficulty in walking, not elsewhere classified: Secondary | ICD-10-CM | POA: Diagnosis not present

## 2022-03-07 DIAGNOSIS — R54 Age-related physical debility: Secondary | ICD-10-CM | POA: Diagnosis not present

## 2022-03-07 DIAGNOSIS — M109 Gout, unspecified: Secondary | ICD-10-CM | POA: Diagnosis not present

## 2022-03-07 DIAGNOSIS — I351 Nonrheumatic aortic (valve) insufficiency: Secondary | ICD-10-CM | POA: Diagnosis not present

## 2022-03-07 DIAGNOSIS — Z743 Need for continuous supervision: Secondary | ICD-10-CM | POA: Diagnosis not present

## 2022-03-07 DIAGNOSIS — R531 Weakness: Secondary | ICD-10-CM | POA: Diagnosis not present

## 2022-03-07 DIAGNOSIS — G8929 Other chronic pain: Secondary | ICD-10-CM | POA: Diagnosis not present

## 2022-03-07 DIAGNOSIS — R5381 Other malaise: Secondary | ICD-10-CM | POA: Diagnosis not present

## 2022-03-07 DIAGNOSIS — R0689 Other abnormalities of breathing: Secondary | ICD-10-CM | POA: Diagnosis not present

## 2022-03-07 DIAGNOSIS — Z96653 Presence of artificial knee joint, bilateral: Secondary | ICD-10-CM | POA: Diagnosis not present

## 2022-03-07 DIAGNOSIS — Q600 Renal agenesis, unilateral: Secondary | ICD-10-CM | POA: Diagnosis not present

## 2022-03-07 DIAGNOSIS — D509 Iron deficiency anemia, unspecified: Secondary | ICD-10-CM | POA: Diagnosis not present

## 2022-03-07 DIAGNOSIS — R457 State of emotional shock and stress, unspecified: Secondary | ICD-10-CM | POA: Diagnosis not present

## 2022-03-07 DIAGNOSIS — M6281 Muscle weakness (generalized): Secondary | ICD-10-CM | POA: Diagnosis not present

## 2022-03-14 DIAGNOSIS — R031 Nonspecific low blood-pressure reading: Secondary | ICD-10-CM | POA: Diagnosis not present

## 2022-03-14 DIAGNOSIS — Z96652 Presence of left artificial knee joint: Secondary | ICD-10-CM | POA: Diagnosis not present

## 2022-03-14 DIAGNOSIS — R0689 Other abnormalities of breathing: Secondary | ICD-10-CM | POA: Diagnosis not present

## 2022-03-14 DIAGNOSIS — I10 Essential (primary) hypertension: Secondary | ICD-10-CM | POA: Diagnosis not present

## 2022-03-20 DIAGNOSIS — R5381 Other malaise: Secondary | ICD-10-CM | POA: Diagnosis not present

## 2022-03-20 DIAGNOSIS — Z96653 Presence of artificial knee joint, bilateral: Secondary | ICD-10-CM | POA: Diagnosis not present

## 2022-03-20 DIAGNOSIS — R531 Weakness: Secondary | ICD-10-CM | POA: Diagnosis not present

## 2022-03-20 DIAGNOSIS — I10 Essential (primary) hypertension: Secondary | ICD-10-CM | POA: Diagnosis not present

## 2022-03-27 DIAGNOSIS — I10 Essential (primary) hypertension: Secondary | ICD-10-CM | POA: Diagnosis not present

## 2022-03-27 DIAGNOSIS — Z96652 Presence of left artificial knee joint: Secondary | ICD-10-CM | POA: Diagnosis not present

## 2022-03-27 DIAGNOSIS — M6281 Muscle weakness (generalized): Secondary | ICD-10-CM | POA: Diagnosis not present

## 2022-03-27 DIAGNOSIS — R54 Age-related physical debility: Secondary | ICD-10-CM | POA: Diagnosis not present

## 2022-04-07 DIAGNOSIS — R531 Weakness: Secondary | ICD-10-CM | POA: Diagnosis not present

## 2022-04-07 DIAGNOSIS — M25662 Stiffness of left knee, not elsewhere classified: Secondary | ICD-10-CM | POA: Diagnosis not present

## 2022-04-07 DIAGNOSIS — Z7409 Other reduced mobility: Secondary | ICD-10-CM | POA: Diagnosis not present

## 2022-04-07 DIAGNOSIS — Z789 Other specified health status: Secondary | ICD-10-CM | POA: Diagnosis not present

## 2022-04-07 DIAGNOSIS — Z96652 Presence of left artificial knee joint: Secondary | ICD-10-CM | POA: Diagnosis not present

## 2022-04-07 DIAGNOSIS — T84093D Other mechanical complication of internal left knee prosthesis, subsequent encounter: Secondary | ICD-10-CM | POA: Diagnosis not present

## 2022-04-10 NOTE — Progress Notes (Signed)
This encounter was created in error - please disregard.

## 2022-04-14 DIAGNOSIS — Z96652 Presence of left artificial knee joint: Secondary | ICD-10-CM | POA: Diagnosis not present

## 2022-04-14 DIAGNOSIS — M85862 Other specified disorders of bone density and structure, left lower leg: Secondary | ICD-10-CM | POA: Diagnosis not present

## 2022-04-14 DIAGNOSIS — Z471 Aftercare following joint replacement surgery: Secondary | ICD-10-CM | POA: Diagnosis not present

## 2022-04-14 DIAGNOSIS — M7652 Patellar tendinitis, left knee: Secondary | ICD-10-CM | POA: Diagnosis not present

## 2022-04-14 DIAGNOSIS — I999 Unspecified disorder of circulatory system: Secondary | ICD-10-CM | POA: Diagnosis not present

## 2022-04-14 DIAGNOSIS — M25462 Effusion, left knee: Secondary | ICD-10-CM | POA: Diagnosis not present

## 2022-04-14 DIAGNOSIS — Z96653 Presence of artificial knee joint, bilateral: Secondary | ICD-10-CM | POA: Diagnosis not present

## 2022-04-15 DIAGNOSIS — R531 Weakness: Secondary | ICD-10-CM | POA: Diagnosis not present

## 2022-04-15 DIAGNOSIS — Z789 Other specified health status: Secondary | ICD-10-CM | POA: Diagnosis not present

## 2022-04-15 DIAGNOSIS — M25662 Stiffness of left knee, not elsewhere classified: Secondary | ICD-10-CM | POA: Diagnosis not present

## 2022-04-15 DIAGNOSIS — Z7409 Other reduced mobility: Secondary | ICD-10-CM | POA: Diagnosis not present

## 2022-04-15 DIAGNOSIS — T84093D Other mechanical complication of internal left knee prosthesis, subsequent encounter: Secondary | ICD-10-CM | POA: Diagnosis not present

## 2022-04-15 DIAGNOSIS — Z96652 Presence of left artificial knee joint: Secondary | ICD-10-CM | POA: Diagnosis not present

## 2022-04-17 DIAGNOSIS — T84093D Other mechanical complication of internal left knee prosthesis, subsequent encounter: Secondary | ICD-10-CM | POA: Diagnosis not present

## 2022-04-17 DIAGNOSIS — Z7409 Other reduced mobility: Secondary | ICD-10-CM | POA: Diagnosis not present

## 2022-04-17 DIAGNOSIS — M25662 Stiffness of left knee, not elsewhere classified: Secondary | ICD-10-CM | POA: Diagnosis not present

## 2022-04-17 DIAGNOSIS — R531 Weakness: Secondary | ICD-10-CM | POA: Diagnosis not present

## 2022-04-17 DIAGNOSIS — Z789 Other specified health status: Secondary | ICD-10-CM | POA: Diagnosis not present

## 2022-04-17 DIAGNOSIS — Z96652 Presence of left artificial knee joint: Secondary | ICD-10-CM | POA: Diagnosis not present

## 2022-04-23 DIAGNOSIS — Z7409 Other reduced mobility: Secondary | ICD-10-CM | POA: Diagnosis not present

## 2022-04-23 DIAGNOSIS — Z96652 Presence of left artificial knee joint: Secondary | ICD-10-CM | POA: Diagnosis not present

## 2022-04-23 DIAGNOSIS — Z789 Other specified health status: Secondary | ICD-10-CM | POA: Diagnosis not present

## 2022-04-23 DIAGNOSIS — M25662 Stiffness of left knee, not elsewhere classified: Secondary | ICD-10-CM | POA: Diagnosis not present

## 2022-04-23 DIAGNOSIS — R531 Weakness: Secondary | ICD-10-CM | POA: Diagnosis not present

## 2022-04-23 DIAGNOSIS — T84093D Other mechanical complication of internal left knee prosthesis, subsequent encounter: Secondary | ICD-10-CM | POA: Diagnosis not present

## 2022-04-25 DIAGNOSIS — R531 Weakness: Secondary | ICD-10-CM | POA: Diagnosis not present

## 2022-04-25 DIAGNOSIS — M25662 Stiffness of left knee, not elsewhere classified: Secondary | ICD-10-CM | POA: Diagnosis not present

## 2022-04-25 DIAGNOSIS — Z789 Other specified health status: Secondary | ICD-10-CM | POA: Diagnosis not present

## 2022-04-25 DIAGNOSIS — Z7409 Other reduced mobility: Secondary | ICD-10-CM | POA: Diagnosis not present

## 2022-04-25 DIAGNOSIS — Z96652 Presence of left artificial knee joint: Secondary | ICD-10-CM | POA: Diagnosis not present

## 2022-04-29 DIAGNOSIS — R531 Weakness: Secondary | ICD-10-CM | POA: Diagnosis not present

## 2022-04-29 DIAGNOSIS — Z760 Encounter for issue of repeat prescription: Secondary | ICD-10-CM | POA: Diagnosis not present

## 2022-04-29 DIAGNOSIS — Z7409 Other reduced mobility: Secondary | ICD-10-CM | POA: Diagnosis not present

## 2022-04-29 DIAGNOSIS — M25662 Stiffness of left knee, not elsewhere classified: Secondary | ICD-10-CM | POA: Diagnosis not present

## 2022-04-29 DIAGNOSIS — I10 Essential (primary) hypertension: Secondary | ICD-10-CM | POA: Diagnosis not present

## 2022-04-29 DIAGNOSIS — Z96652 Presence of left artificial knee joint: Secondary | ICD-10-CM | POA: Diagnosis not present

## 2022-04-29 DIAGNOSIS — Z789 Other specified health status: Secondary | ICD-10-CM | POA: Diagnosis not present

## 2022-05-06 DIAGNOSIS — R531 Weakness: Secondary | ICD-10-CM | POA: Diagnosis not present

## 2022-05-06 DIAGNOSIS — Z789 Other specified health status: Secondary | ICD-10-CM | POA: Diagnosis not present

## 2022-05-06 DIAGNOSIS — M25662 Stiffness of left knee, not elsewhere classified: Secondary | ICD-10-CM | POA: Diagnosis not present

## 2022-05-06 DIAGNOSIS — Z96652 Presence of left artificial knee joint: Secondary | ICD-10-CM | POA: Diagnosis not present

## 2022-05-06 DIAGNOSIS — Z7409 Other reduced mobility: Secondary | ICD-10-CM | POA: Diagnosis not present

## 2022-05-08 DIAGNOSIS — M25662 Stiffness of left knee, not elsewhere classified: Secondary | ICD-10-CM | POA: Diagnosis not present

## 2022-05-08 DIAGNOSIS — Z96652 Presence of left artificial knee joint: Secondary | ICD-10-CM | POA: Diagnosis not present

## 2022-05-08 DIAGNOSIS — Z7409 Other reduced mobility: Secondary | ICD-10-CM | POA: Diagnosis not present

## 2022-05-08 DIAGNOSIS — Z789 Other specified health status: Secondary | ICD-10-CM | POA: Diagnosis not present

## 2022-05-08 DIAGNOSIS — R531 Weakness: Secondary | ICD-10-CM | POA: Diagnosis not present

## 2022-05-13 DIAGNOSIS — Z789 Other specified health status: Secondary | ICD-10-CM | POA: Diagnosis not present

## 2022-05-13 DIAGNOSIS — R531 Weakness: Secondary | ICD-10-CM | POA: Diagnosis not present

## 2022-05-13 DIAGNOSIS — Z96652 Presence of left artificial knee joint: Secondary | ICD-10-CM | POA: Diagnosis not present

## 2022-05-13 DIAGNOSIS — M25662 Stiffness of left knee, not elsewhere classified: Secondary | ICD-10-CM | POA: Diagnosis not present

## 2022-05-13 DIAGNOSIS — Z7409 Other reduced mobility: Secondary | ICD-10-CM | POA: Diagnosis not present

## 2022-05-20 DIAGNOSIS — M25662 Stiffness of left knee, not elsewhere classified: Secondary | ICD-10-CM | POA: Diagnosis not present

## 2022-05-20 DIAGNOSIS — Z96652 Presence of left artificial knee joint: Secondary | ICD-10-CM | POA: Diagnosis not present

## 2022-05-20 DIAGNOSIS — R531 Weakness: Secondary | ICD-10-CM | POA: Diagnosis not present

## 2022-05-20 DIAGNOSIS — Z789 Other specified health status: Secondary | ICD-10-CM | POA: Diagnosis not present

## 2022-05-20 DIAGNOSIS — Z7409 Other reduced mobility: Secondary | ICD-10-CM | POA: Diagnosis not present

## 2022-05-22 DIAGNOSIS — Z789 Other specified health status: Secondary | ICD-10-CM | POA: Diagnosis not present

## 2022-05-22 DIAGNOSIS — Z96652 Presence of left artificial knee joint: Secondary | ICD-10-CM | POA: Diagnosis not present

## 2022-05-22 DIAGNOSIS — M25662 Stiffness of left knee, not elsewhere classified: Secondary | ICD-10-CM | POA: Diagnosis not present

## 2022-05-22 DIAGNOSIS — R531 Weakness: Secondary | ICD-10-CM | POA: Diagnosis not present

## 2022-05-22 DIAGNOSIS — Z7409 Other reduced mobility: Secondary | ICD-10-CM | POA: Diagnosis not present

## 2022-05-27 DIAGNOSIS — Z7409 Other reduced mobility: Secondary | ICD-10-CM | POA: Diagnosis not present

## 2022-05-27 DIAGNOSIS — M25662 Stiffness of left knee, not elsewhere classified: Secondary | ICD-10-CM | POA: Diagnosis not present

## 2022-05-27 DIAGNOSIS — Z789 Other specified health status: Secondary | ICD-10-CM | POA: Diagnosis not present

## 2022-05-27 DIAGNOSIS — Z96652 Presence of left artificial knee joint: Secondary | ICD-10-CM | POA: Diagnosis not present

## 2022-05-27 DIAGNOSIS — R531 Weakness: Secondary | ICD-10-CM | POA: Diagnosis not present

## 2022-05-29 DIAGNOSIS — D3502 Benign neoplasm of left adrenal gland: Secondary | ICD-10-CM | POA: Diagnosis not present

## 2022-05-29 DIAGNOSIS — I10 Essential (primary) hypertension: Secondary | ICD-10-CM | POA: Diagnosis not present

## 2022-05-29 DIAGNOSIS — R769 Abnormal immunological finding in serum, unspecified: Secondary | ICD-10-CM | POA: Diagnosis not present

## 2022-05-29 DIAGNOSIS — E278 Other specified disorders of adrenal gland: Secondary | ICD-10-CM | POA: Diagnosis not present

## 2022-05-29 DIAGNOSIS — Z23 Encounter for immunization: Secondary | ICD-10-CM | POA: Diagnosis not present

## 2022-06-05 DIAGNOSIS — M25662 Stiffness of left knee, not elsewhere classified: Secondary | ICD-10-CM | POA: Diagnosis not present

## 2022-06-05 DIAGNOSIS — Z789 Other specified health status: Secondary | ICD-10-CM | POA: Diagnosis not present

## 2022-06-05 DIAGNOSIS — R531 Weakness: Secondary | ICD-10-CM | POA: Diagnosis not present

## 2022-06-05 DIAGNOSIS — Z96652 Presence of left artificial knee joint: Secondary | ICD-10-CM | POA: Diagnosis not present

## 2022-06-05 DIAGNOSIS — Z7409 Other reduced mobility: Secondary | ICD-10-CM | POA: Diagnosis not present

## 2022-06-10 DIAGNOSIS — Z7409 Other reduced mobility: Secondary | ICD-10-CM | POA: Diagnosis not present

## 2022-06-10 DIAGNOSIS — Z96652 Presence of left artificial knee joint: Secondary | ICD-10-CM | POA: Diagnosis not present

## 2022-06-10 DIAGNOSIS — R531 Weakness: Secondary | ICD-10-CM | POA: Diagnosis not present

## 2022-06-10 DIAGNOSIS — Z789 Other specified health status: Secondary | ICD-10-CM | POA: Diagnosis not present

## 2022-06-10 DIAGNOSIS — M25662 Stiffness of left knee, not elsewhere classified: Secondary | ICD-10-CM | POA: Diagnosis not present

## 2022-06-11 DIAGNOSIS — R928 Other abnormal and inconclusive findings on diagnostic imaging of breast: Secondary | ICD-10-CM | POA: Diagnosis not present

## 2022-06-17 DIAGNOSIS — R531 Weakness: Secondary | ICD-10-CM | POA: Diagnosis not present

## 2022-06-17 DIAGNOSIS — Z96652 Presence of left artificial knee joint: Secondary | ICD-10-CM | POA: Diagnosis not present

## 2022-06-17 DIAGNOSIS — Z789 Other specified health status: Secondary | ICD-10-CM | POA: Diagnosis not present

## 2022-06-17 DIAGNOSIS — Z7409 Other reduced mobility: Secondary | ICD-10-CM | POA: Diagnosis not present

## 2022-06-17 DIAGNOSIS — M25662 Stiffness of left knee, not elsewhere classified: Secondary | ICD-10-CM | POA: Diagnosis not present

## 2022-06-18 DIAGNOSIS — M1A9XX0 Chronic gout, unspecified, without tophus (tophi): Secondary | ICD-10-CM | POA: Diagnosis not present

## 2022-06-18 DIAGNOSIS — Z780 Asymptomatic menopausal state: Secondary | ICD-10-CM | POA: Diagnosis not present

## 2022-06-18 DIAGNOSIS — R32 Unspecified urinary incontinence: Secondary | ICD-10-CM | POA: Diagnosis not present

## 2022-06-18 DIAGNOSIS — D509 Iron deficiency anemia, unspecified: Secondary | ICD-10-CM | POA: Diagnosis not present

## 2022-06-18 DIAGNOSIS — E896 Postprocedural adrenocortical (-medullary) hypofunction: Secondary | ICD-10-CM

## 2022-06-18 DIAGNOSIS — Z9109 Other allergy status, other than to drugs and biological substances: Secondary | ICD-10-CM | POA: Diagnosis not present

## 2022-06-18 DIAGNOSIS — I10 Essential (primary) hypertension: Secondary | ICD-10-CM | POA: Diagnosis not present

## 2022-06-18 DIAGNOSIS — Z13820 Encounter for screening for osteoporosis: Secondary | ICD-10-CM | POA: Diagnosis not present

## 2022-06-19 DIAGNOSIS — M25662 Stiffness of left knee, not elsewhere classified: Secondary | ICD-10-CM | POA: Diagnosis not present

## 2022-06-19 DIAGNOSIS — Z789 Other specified health status: Secondary | ICD-10-CM | POA: Diagnosis not present

## 2022-06-19 DIAGNOSIS — R531 Weakness: Secondary | ICD-10-CM | POA: Diagnosis not present

## 2022-06-19 DIAGNOSIS — Z7409 Other reduced mobility: Secondary | ICD-10-CM | POA: Diagnosis not present

## 2022-06-19 DIAGNOSIS — Z96652 Presence of left artificial knee joint: Secondary | ICD-10-CM | POA: Diagnosis not present

## 2022-06-26 DIAGNOSIS — R531 Weakness: Secondary | ICD-10-CM | POA: Diagnosis not present

## 2022-06-26 DIAGNOSIS — Z96652 Presence of left artificial knee joint: Secondary | ICD-10-CM | POA: Diagnosis not present

## 2022-06-26 DIAGNOSIS — Z7409 Other reduced mobility: Secondary | ICD-10-CM | POA: Diagnosis not present

## 2022-06-26 DIAGNOSIS — M25662 Stiffness of left knee, not elsewhere classified: Secondary | ICD-10-CM | POA: Diagnosis not present

## 2022-06-26 DIAGNOSIS — Z789 Other specified health status: Secondary | ICD-10-CM | POA: Diagnosis not present

## 2022-07-01 DIAGNOSIS — Z780 Asymptomatic menopausal state: Secondary | ICD-10-CM | POA: Diagnosis not present

## 2022-07-01 DIAGNOSIS — M8580 Other specified disorders of bone density and structure, unspecified site: Secondary | ICD-10-CM | POA: Diagnosis not present

## 2022-07-01 DIAGNOSIS — Z13820 Encounter for screening for osteoporosis: Secondary | ICD-10-CM | POA: Diagnosis not present

## 2022-07-01 DIAGNOSIS — M85851 Other specified disorders of bone density and structure, right thigh: Secondary | ICD-10-CM | POA: Diagnosis not present

## 2022-07-07 DIAGNOSIS — Z96653 Presence of artificial knee joint, bilateral: Secondary | ICD-10-CM | POA: Diagnosis not present

## 2022-07-07 DIAGNOSIS — M778 Other enthesopathies, not elsewhere classified: Secondary | ICD-10-CM | POA: Diagnosis not present

## 2022-07-07 DIAGNOSIS — Z881 Allergy status to other antibiotic agents status: Secondary | ICD-10-CM | POA: Diagnosis not present

## 2022-07-07 DIAGNOSIS — M25461 Effusion, right knee: Secondary | ICD-10-CM | POA: Diagnosis not present

## 2022-07-07 DIAGNOSIS — M8588 Other specified disorders of bone density and structure, other site: Secondary | ICD-10-CM | POA: Diagnosis not present

## 2022-07-07 DIAGNOSIS — Z880 Allergy status to penicillin: Secondary | ICD-10-CM | POA: Diagnosis not present

## 2022-07-07 DIAGNOSIS — Z96652 Presence of left artificial knee joint: Secondary | ICD-10-CM | POA: Diagnosis not present

## 2022-07-07 DIAGNOSIS — Z471 Aftercare following joint replacement surgery: Secondary | ICD-10-CM | POA: Diagnosis not present

## 2022-07-07 DIAGNOSIS — M25462 Effusion, left knee: Secondary | ICD-10-CM | POA: Diagnosis not present

## 2022-07-09 DIAGNOSIS — N289 Disorder of kidney and ureter, unspecified: Secondary | ICD-10-CM | POA: Diagnosis not present

## 2022-07-09 DIAGNOSIS — E896 Postprocedural adrenocortical (-medullary) hypofunction: Secondary | ICD-10-CM | POA: Diagnosis not present

## 2022-07-30 ENCOUNTER — Ambulatory Visit: Payer: Medicare Other | Admitting: Family Medicine

## 2022-08-12 DIAGNOSIS — D3502 Benign neoplasm of left adrenal gland: Secondary | ICD-10-CM | POA: Diagnosis not present

## 2023-04-09 ENCOUNTER — Emergency Department (HOSPITAL_BASED_OUTPATIENT_CLINIC_OR_DEPARTMENT_OTHER): Payer: Medicare Other

## 2023-04-09 ENCOUNTER — Other Ambulatory Visit: Payer: Self-pay

## 2023-04-09 ENCOUNTER — Encounter (HOSPITAL_BASED_OUTPATIENT_CLINIC_OR_DEPARTMENT_OTHER): Payer: Self-pay | Admitting: Emergency Medicine

## 2023-04-09 ENCOUNTER — Inpatient Hospital Stay (HOSPITAL_BASED_OUTPATIENT_CLINIC_OR_DEPARTMENT_OTHER)
Admission: EM | Admit: 2023-04-09 | Discharge: 2023-04-23 | DRG: 335 | Disposition: A | Discharge status: Home Health | Payer: Medicare Other | Attending: Family Medicine | Admitting: Family Medicine

## 2023-04-09 DIAGNOSIS — E43 Unspecified severe protein-calorie malnutrition: Secondary | ICD-10-CM | POA: Diagnosis present

## 2023-04-09 DIAGNOSIS — Z96653 Presence of artificial knee joint, bilateral: Secondary | ICD-10-CM | POA: Diagnosis present

## 2023-04-09 DIAGNOSIS — K5651 Intestinal adhesions [bands], with partial obstruction: Principal | ICD-10-CM | POA: Diagnosis present

## 2023-04-09 DIAGNOSIS — E669 Obesity, unspecified: Secondary | ICD-10-CM | POA: Diagnosis present

## 2023-04-09 DIAGNOSIS — J9811 Atelectasis: Secondary | ICD-10-CM | POA: Diagnosis not present

## 2023-04-09 DIAGNOSIS — E876 Hypokalemia: Secondary | ICD-10-CM | POA: Diagnosis present

## 2023-04-09 DIAGNOSIS — Z880 Allergy status to penicillin: Secondary | ICD-10-CM

## 2023-04-09 DIAGNOSIS — D631 Anemia in chronic kidney disease: Secondary | ICD-10-CM | POA: Diagnosis present

## 2023-04-09 DIAGNOSIS — Z79899 Other long term (current) drug therapy: Secondary | ICD-10-CM

## 2023-04-09 DIAGNOSIS — Z7982 Long term (current) use of aspirin: Secondary | ICD-10-CM

## 2023-04-09 DIAGNOSIS — N179 Acute kidney failure, unspecified: Secondary | ICD-10-CM | POA: Diagnosis present

## 2023-04-09 DIAGNOSIS — E860 Dehydration: Secondary | ICD-10-CM | POA: Diagnosis present

## 2023-04-09 DIAGNOSIS — Z1152 Encounter for screening for COVID-19: Secondary | ICD-10-CM

## 2023-04-09 DIAGNOSIS — E871 Hypo-osmolality and hyponatremia: Secondary | ICD-10-CM | POA: Diagnosis present

## 2023-04-09 DIAGNOSIS — Z803 Family history of malignant neoplasm of breast: Secondary | ICD-10-CM | POA: Diagnosis not present

## 2023-04-09 DIAGNOSIS — E870 Hyperosmolality and hypernatremia: Secondary | ICD-10-CM | POA: Diagnosis present

## 2023-04-09 DIAGNOSIS — M109 Gout, unspecified: Secondary | ICD-10-CM | POA: Diagnosis present

## 2023-04-09 DIAGNOSIS — Z6834 Body mass index (BMI) 34.0-34.9, adult: Secondary | ICD-10-CM

## 2023-04-09 DIAGNOSIS — R112 Nausea with vomiting, unspecified: Secondary | ICD-10-CM

## 2023-04-09 DIAGNOSIS — Z8619 Personal history of other infectious and parasitic diseases: Secondary | ICD-10-CM

## 2023-04-09 DIAGNOSIS — D62 Acute posthemorrhagic anemia: Secondary | ICD-10-CM | POA: Diagnosis not present

## 2023-04-09 DIAGNOSIS — I129 Hypertensive chronic kidney disease with stage 1 through stage 4 chronic kidney disease, or unspecified chronic kidney disease: Secondary | ICD-10-CM | POA: Diagnosis present

## 2023-04-09 DIAGNOSIS — N189 Chronic kidney disease, unspecified: Secondary | ICD-10-CM | POA: Diagnosis present

## 2023-04-09 DIAGNOSIS — Z905 Acquired absence of kidney: Secondary | ICD-10-CM | POA: Diagnosis not present

## 2023-04-09 DIAGNOSIS — K56609 Unspecified intestinal obstruction, unspecified as to partial versus complete obstruction: Secondary | ICD-10-CM

## 2023-04-09 DIAGNOSIS — E896 Postprocedural adrenocortical (-medullary) hypofunction: Secondary | ICD-10-CM

## 2023-04-09 DIAGNOSIS — Z90710 Acquired absence of both cervix and uterus: Secondary | ICD-10-CM | POA: Diagnosis not present

## 2023-04-09 DIAGNOSIS — D72829 Elevated white blood cell count, unspecified: Secondary | ICD-10-CM | POA: Diagnosis not present

## 2023-04-09 DIAGNOSIS — I10 Essential (primary) hypertension: Secondary | ICD-10-CM | POA: Diagnosis not present

## 2023-04-09 DIAGNOSIS — R609 Edema, unspecified: Secondary | ICD-10-CM | POA: Diagnosis present

## 2023-04-09 DIAGNOSIS — Z5982 Transportation insecurity: Secondary | ICD-10-CM

## 2023-04-09 LAB — CBC WITH DIFFERENTIAL/PLATELET
Abs Immature Granulocytes: 0.05 10*3/uL (ref 0.00–0.07)
Basophils Absolute: 0 10*3/uL (ref 0.0–0.1)
Basophils Relative: 0 %
Eosinophils Absolute: 0 10*3/uL (ref 0.0–0.5)
Eosinophils Relative: 0 %
HCT: 36.5 % (ref 36.0–46.0)
Hemoglobin: 11.9 g/dL — ABNORMAL LOW (ref 12.0–15.0)
Immature Granulocytes: 1 %
Lymphocytes Relative: 12 %
Lymphs Abs: 0.7 10*3/uL (ref 0.7–4.0)
MCH: 27.5 pg (ref 26.0–34.0)
MCHC: 32.6 g/dL (ref 30.0–36.0)
MCV: 84.5 fL (ref 80.0–100.0)
Monocytes Absolute: 0.8 10*3/uL (ref 0.1–1.0)
Monocytes Relative: 13 %
Neutro Abs: 4.3 10*3/uL (ref 1.7–7.7)
Neutrophils Relative %: 74 %
Platelets: 189 10*3/uL (ref 150–400)
RBC: 4.32 MIL/uL (ref 3.87–5.11)
RDW: 14.6 % (ref 11.5–15.5)
WBC: 5.8 10*3/uL (ref 4.0–10.5)
nRBC: 0 % (ref 0.0–0.2)

## 2023-04-09 LAB — COMPREHENSIVE METABOLIC PANEL
ALT: 15 U/L (ref 0–44)
AST: 22 U/L (ref 15–41)
Albumin: 4.5 g/dL (ref 3.5–5.0)
Alkaline Phosphatase: 89 U/L (ref 38–126)
Anion gap: 15 (ref 5–15)
BUN: 73 mg/dL — ABNORMAL HIGH (ref 8–23)
CO2: 30 mmol/L (ref 22–32)
Calcium: 9.8 mg/dL (ref 8.9–10.3)
Chloride: 90 mmol/L — ABNORMAL LOW (ref 98–111)
Creatinine, Ser: 2.54 mg/dL — ABNORMAL HIGH (ref 0.44–1.00)
GFR, Estimated: 19 mL/min — ABNORMAL LOW (ref >60)
Glucose, Bld: 116 mg/dL — ABNORMAL HIGH (ref 70–99)
Potassium: 4.1 mmol/L (ref 3.5–5.1)
Sodium: 135 mmol/L (ref 135–145)
Total Bilirubin: 0.7 mg/dL (ref 0.3–1.2)
Total Protein: 8.1 g/dL (ref 6.5–8.1)

## 2023-04-09 LAB — LIPASE, BLOOD: Lipase: 42 U/L (ref 11–51)

## 2023-04-09 LAB — SARS CORONAVIRUS 2 BY RT PCR: SARS Coronavirus 2 by RT PCR: NEGATIVE

## 2023-04-09 MED ORDER — SODIUM CHLORIDE 0.9 % IV SOLN: INTRAVENOUS | Status: AC

## 2023-04-09 MED ORDER — SODIUM CHLORIDE 0.9 % IV BOLUS
1000.0000 mL | Freq: Once | INTRAVENOUS | Status: AC
  Administered 2023-04-09: 1000 mL via INTRAVENOUS

## 2023-04-09 MED ORDER — BISACODYL 10 MG RE SUPP
10.0000 mg | Freq: Every day | RECTAL | Status: DC
  Administered 2023-04-10 – 2023-04-14 (×5): 10 mg via RECTAL
  Filled 2023-04-09 (×5): qty 1

## 2023-04-09 MED ORDER — MAGIC MOUTHWASH
15.0000 mL | Freq: Four times a day (QID) | ORAL | Status: DC | PRN
  Administered 2023-04-12: 15 mL via ORAL
  Filled 2023-04-09 (×2): qty 15

## 2023-04-09 MED ORDER — NAPHAZOLINE-GLYCERIN 0.012-0.25 % OP SOLN
1.0000 [drp] | Freq: Four times a day (QID) | OPHTHALMIC | Status: DC | PRN
  Filled 2023-04-09: qty 15

## 2023-04-09 MED ORDER — SIMETHICONE 40 MG/0.6ML PO SUSP: 80.0000 mg | Freq: Four times a day (QID) | ORAL | Status: DC | PRN

## 2023-04-09 MED ORDER — HEPARIN SODIUM (PORCINE) 5000 UNIT/ML IJ SOLN
5000.0000 [IU] | Freq: Three times a day (TID) | INTRAMUSCULAR | Status: DC
  Administered 2023-04-09 – 2023-04-14 (×14): 5000 [IU] via SUBCUTANEOUS
  Filled 2023-04-09 (×14): qty 1

## 2023-04-09 MED ORDER — LACTATED RINGERS IV BOLUS
1000.0000 mL | Freq: Once | INTRAVENOUS | Status: AC
  Administered 2023-04-09: 1000 mL via INTRAVENOUS

## 2023-04-09 MED ORDER — METHOCARBAMOL 1000 MG/10ML IJ SOLN
1000.0000 mg | Freq: Four times a day (QID) | INTRAVENOUS | Status: DC | PRN
  Filled 2023-04-09: qty 10

## 2023-04-09 MED ORDER — SALINE SPRAY 0.65 % NA SOLN: 1.0000 | Freq: Four times a day (QID) | NASAL | Status: DC | PRN

## 2023-04-09 MED ORDER — PROCHLORPERAZINE EDISYLATE 10 MG/2ML IJ SOLN: 5.0000 mg | INTRAMUSCULAR | Status: DC | PRN

## 2023-04-09 MED ORDER — PHENOL 1.4 % MT LIQD
2.0000 | OROMUCOSAL | Status: DC | PRN
  Filled 2023-04-09: qty 177

## 2023-04-09 MED ORDER — ONDANSETRON HCL 4 MG/2ML IJ SOLN
4.0000 mg | Freq: Once | INTRAMUSCULAR | Status: AC
  Administered 2023-04-09: 4 mg via INTRAVENOUS
  Filled 2023-04-09: qty 2

## 2023-04-09 MED ORDER — LACTATED RINGERS IV BOLUS
1000.0000 mL | Freq: Three times a day (TID) | INTRAVENOUS | Status: AC | PRN
  Administered 2023-04-09: 1000 mL via INTRAVENOUS

## 2023-04-09 MED ORDER — FENTANYL CITRATE PF 50 MCG/ML IJ SOSY: 25.0000 ug | PREFILLED_SYRINGE | INTRAMUSCULAR | Status: DC | PRN

## 2023-04-09 MED ORDER — ACETAMINOPHEN 650 MG RE SUPP: 650.0000 mg | Freq: Four times a day (QID) | RECTAL | Status: DC | PRN

## 2023-04-09 MED ORDER — MENTHOL 3 MG MT LOZG
1.0000 | LOZENGE | OROMUCOSAL | Status: DC | PRN
  Administered 2023-04-09 – 2023-04-11 (×2): 3 mg via ORAL
  Filled 2023-04-09 (×3): qty 9

## 2023-04-09 MED ORDER — ALUM & MAG HYDROXIDE-SIMETH 200-200-20 MG/5ML PO SUSP: 30.0000 mL | Freq: Four times a day (QID) | ORAL | Status: DC | PRN

## 2023-04-09 MED ORDER — DIATRIZOATE MEGLUMINE & SODIUM 66-10 % PO SOLN
90.0000 mL | Freq: Once | ORAL | Status: DC
  Filled 2023-04-09: qty 90

## 2023-04-09 MED ORDER — ONDANSETRON HCL 4 MG/2ML IJ SOLN: 4.0000 mg | Freq: Four times a day (QID) | INTRAMUSCULAR | Status: DC | PRN

## 2023-04-09 MED ORDER — SODIUM CHLORIDE 0.9 % IV SOLN
8.0000 mg | Freq: Four times a day (QID) | INTRAVENOUS | Status: DC | PRN
  Filled 2023-04-09: qty 4

## 2023-04-09 MED ORDER — FAMOTIDINE IN NACL 20-0.9 MG/50ML-% IV SOLN
20.0000 mg | Freq: Once | INTRAVENOUS | Status: AC
  Administered 2023-04-09: 20 mg via INTRAVENOUS
  Filled 2023-04-09: qty 50

## 2023-04-09 NOTE — H&P (Signed)
History and Physical    Nicole Oconnor Oconnor AVW:[id number] DOB: 06/20/1946 DOA: 04/09/2023  PCP: Michaelle Birks [facility]  Patient coming from: Home  I have personally briefly reviewed patient's old medical records in [facility]  Chief Complaint: Abdominal pain, nausea, vomiting  HPI: Nicole Oconnor Oconnor is a 77 y.o. 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  Patient reports developing abdominal pain evening of 8/13.  She had a formed bowel movement however then developed nausea and vomiting which has been persistent.  She has not been able to maintain adequate oral intake.  She has not had any bowel movement or passing gas since then.  By the time she was in the ED her nausea and vomiting had reportedly subsided however she says she did have another episode during attempts to place an NG tube which were unsuccessful.  She also reports decreased urine output than expected.  [facility] ED Course  Labs/Imaging on admission: I have personally reviewed following labs and imaging studies.  Initial vitals showed BP 91/71, pulse 87, RR 18, temp 98.3 F, SpO2 95% room air.  Labs showed sodium 135, potassium 4.1, bicarb 30, BUN 73, creatinine 2.54 (0.75 on 07/09/2022), serum glucose 116, LFTs within normal limits, lipase 42, WBC 5.8, hemoglobin 11.9, platelets 189,000.  SARS-CoV-2 PCR is negative.  CT abdomen/pelvis without contrast showed dilated stomach, duodenal and proximal small bowel up to 4 and half centimeters with transition point in the left hemipelvis.  No free air, pneumatosis, or free fluid.  Surgical changes from previous left adrenalectomy and nephrectomy noted.  Patient was given 2 L IV fluids, IV Pepcid.  EDP consulted general surgery Dr. Michaell Cowing  who recommended NG tube placement and medical admission.  The hospitalist service was consulted to admit for further evaluation and management.  Review of Systems: All systems reviewed and are negative except as documented in history of present illness above.   Past Medical History:  Diagnosis Date   Headache(784.0)    Frequently   History of measles, mumps, or rubella    Hypertension    Leaking of urine    Pheochromocytoma of left adrenal gland 11/06/2021   Solitary kidney, congenital 05/14/2021   Yeast infection     Past Surgical History:  Procedure Laterality Date   ABDOMINAL HYSTERECTOMY  08/25/1997   ADRENALECTOMY Left 11/06/2021   [facility] - for pheochromocytoma   LYSIS OF ADHESION  06/10/2021   NEPHRECTOMY Left 01/23/1974   REPLACEMENT TOTAL KNEE BILATERAL     TUBAL LIGATION      Social History:  reports that she has never smoked. She has never used smokeless tobacco. She reports that she does not currently use alcohol. She reports that she does not use drugs.  Allergies  Allergen Reactions   Penicillins     Family History  Problem Relation Age of Onset   Breast cancer Sister      Prior to Admission medications   Medication Sig Start Date End Date Taking? Authorizing Provider  allopurinol (ZYLOPRIM) 300 MG tablet TAKE 1 TABLET BY MOUTH EVERY DAY 01/06/22   Arnette Felts, FNP  aspirin 81 MG tablet Take 81 mg by mouth daily.    [provider]  atenolol (TENORMIN) 50 MG tablet TAKE 1 TABLET BY MOUTH EVERY DAY 01/06/22  Arnette Felts, FNP  loratadine (CLARITIN) 10 MG tablet TAKE 1 TABLET BY MOUTH EVERY DAY 04/01/21   Dorothyann Peng, MD  olmesartan-hydrochlorothiazide [facility] HCT) 40-12.5 MG tablet TAKE 1 TABLET BY MOUTH EVERY DAY 01/06/22   Arnette Felts, FNP    Physical Exam: Vitals:   04/09/23 1330 04/09/23 1730 04/09/23 1839 04/09/23 1939  BP: 106/70 117/71  (!) 93/47  Pulse: 79 82  75  Resp: 16   17  Temp:   98.8 F (37.1 C) 98.4 F (36.9 C)   TempSrc:      SpO2: 95% 97%  94%  Weight:    83.9 kg  Height:    5\' 3"  (1.6 m)   Constitutional: Resting in bed, appears fatigued but in NAD, calm Eyes: EOMI, lids and conjunctivae normal ENMT: Mucous membranes are moist. Posterior pharynx clear of any exudate or lesions.Normal dentition.  Neck: normal, supple, no masses. Respiratory: clear to auscultation bilaterally, no wheezing, no crackles. Normal respiratory effort. No accessory muscle use.  Cardiovascular: Regular rate and rhythm, no murmurs / rubs / gallops. No extremity edema. 2+ pedal pulses. Abdomen: no tenderness, no masses palpated. Musculoskeletal: no clubbing / cyanosis. No joint deformity upper and lower extremities. Good ROM, no contractures. Normal muscle tone.  Skin: no rashes, lesions, ulcers. No induration Neurologic: Sensation intact. Strength 5/5 in all 4.  Psychiatric: Normal judgment and insight. Alert and oriented x 3. Normal mood.   EKG: Ordered and pending.  Assessment/Plan Principal Problem:   SBO (small bowel obstruction) (HCC) Active Problems:   Essential hypertension   Gout   Edema   H/O total adrenalectomy (HCC)   AKI (acute kidney injury) (HCC)   [name] is a 77 y.o. 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  Assessment and Plan: Small bowel obstruction: Seen on CT A/P.  History of same requiring ex lap and lysis of adhesions 05/2021.  NG tube placement attempted but unsuccessful.  She is not having any further active nausea or vomiting.  No BM or flatulence since 8/13 evening. -General Surgery following -Keep n.p.o. -Continue IV fluid hydration, analgesics and antiemetics as needed  Acute kidney injury: Creatinine 2.54 on admission, previously 0.75 07/09/2022.  Likely volume depleted related to SBO.   Continue IV fluid hydration.  Monitor UOP, obtain UA.  Hypertension: BP has been soft.  Continue IV fluid hydration with bolus as needed.  Hold home antihypertensives.   DVT prophylaxis: heparin injection 5,000 Units Start: 04/09/23 2200 Code Status: Full code, confirmed with patient on admission Family Communication: Discussed with patient, she has discussed with family Disposition Plan: From home, dispo pending clinical progress Consults called: General Surgery Severity of Illness: The appropriate patient status for this patient is INPATIENT. Inpatient status is judged to be reasonable and necessary in order to provide the required intensity of service to ensure the patient's safety. The patient's presenting symptoms, physical exam findings, and initial radiographic and laboratory data in the context of their chronic comorbidities is felt to place them at high risk for further clinical deterioration. Furthermore, it is not anticipated that the patient will be medically stable for discharge from the hospital within 2 midnights of admission.   * I certify that at the point of admission it is my clinical judgment that the patient will require inpatient hospital care spanning beyond 2 midnights from  the point of admission due to high intensity of service, high risk for further deterioration and high frequency of surveillance required.Darreld Mclean MD [facility]  If 7PM-7AM, please contact night-coverage [email or web address]  04/09/2023, 8:50 PM

## 2023-04-09 NOTE — Hospital Course (Addendum)
Nicole Oconnor is a 77 y.o. female with a history of pheochromocytoma status post open left adrenalectomy, solitary right kidney, small bowel obstruction status post exploratory laparotomy with lysis of adhesions, hypertension.  Patient presents secondary to abdominal pain nausea, vomiting with evidence of small bowel obstruction.  General surgery was consulted.  Patient managed conservatively with an NG tube with failure of treatment.  Patient underwent exploratory laparotomy on 8/20 with lysis of adhesions.  TPN started status post surgery.  Diet advancing well.

## 2023-04-09 NOTE — ED Provider Notes (Signed)
[place] [facility] AT [facility] Provider Note  CSN: [id number] Arrival date & time: 04/09/23 1254  Chief Complaint(s) Emesis  HPI Nicole Oconnor is a 77 y.o. female with past medical history as below, significant for hypertension, abdominal hysterectomy, gout who presents to the ED with complaint of abd pain, nausea, vomiting  Ongoing symptoms for the past 48 hours.  Nausea and vomiting.  No diarrhea.  No BRBPR or melena.  No abdominal cramping.P.o. intake, no well water.  No recent travel.  Seen urgent care and was given an injection for nausea which significantly improved her symptoms. No respiratory complaints.  +prior abdominal surgery. Is also requesting COVID-19 test.      Past Medical History Past Medical History:  Diagnosis Date   Headache(784.0)    Frequently   History of measles, mumps, or rubella    Hypertension    Leaking of urine    Yeast infection    Patient Active Problem List   Diagnosis Date Noted   Essential hypertension 05/15/2018   Gout 05/15/2018   Edema 05/15/2018   S/P abdominal supracervical subtotal hysterectomy - 1999 03/31/2012   Home Medication(s) Prior to Admission medications   Medication Sig Start Date End Date Taking? Authorizing Provider  allopurinol (ZYLOPRIM) 300 MG tablet TAKE 1 TABLET BY MOUTH EVERY DAY 01/06/22   Arnette Felts, FNP  aspirin 81 MG tablet Take 81 mg by mouth daily.    [provider]  atenolol (TENORMIN) 50 MG tablet TAKE 1 TABLET BY MOUTH EVERY DAY 01/06/22   Arnette Felts, FNP  loratadine (CLARITIN) 10 MG tablet TAKE 1 TABLET BY MOUTH EVERY DAY 04/01/21   Dorothyann Peng, MD  olmesartan-hydrochlorothiazide [facility] HCT) 40-12.5 MG tablet TAKE 1 TABLET BY MOUTH EVERY DAY 01/06/22   Arnette Felts, FNP                                                                                                                                    Past Surgical History Past Surgical History:  Procedure  Laterality Date   ABDOMINAL HYSTERECTOMY  1999   left kidney removed   01/1974   REPLACEMENT TOTAL KNEE BILATERAL     TUBAL LIGATION     Family History Family History  Problem Relation Age of Onset   Breast cancer Sister     Social History Social History   Tobacco Use   Smoking status: Never   Smokeless tobacco: Never  Vaping Use   Vaping status: Never Used  Substance Use Topics   Alcohol use: Not Currently    Comment: occasional wine   Drug use: No   Allergies Penicillins  Review of Systems Review of Systems  Constitutional:  Negative for chills and fever.  HENT:  Negative for facial swelling and trouble swallowing.   Eyes:  Negative for photophobia and visual disturbance.  Respiratory:  Negative for cough and shortness of breath.   Cardiovascular:  Negative for chest pain and palpitations.  Gastrointestinal:  Positive for abdominal pain, nausea and vomiting.  Endocrine: Negative for polydipsia and polyuria.  Genitourinary:  Negative for difficulty urinating and hematuria.  Musculoskeletal:  Negative for gait problem and joint swelling.  Skin:  Negative for pallor and rash.  Neurological:  Negative for syncope and headaches.  Psychiatric/Behavioral:  Negative for agitation and confusion.     Physical Exam Vital Signs  I have reviewed the triage vital signs BP 106/70   Pulse 79   Temp 98.3 F (36.8 C) (Oral)   Resp 16   SpO2 95%  Physical Exam Vitals and nursing note reviewed.  Constitutional:      General: She is not in acute distress.    Appearance: Normal appearance.  HENT:     Head: Normocephalic and atraumatic.     Right Ear: External ear normal.     Left Ear: External ear normal.     Nose: Nose normal.     Mouth/Throat:     Mouth: Mucous membranes are dry.  Eyes:     General: No scleral icterus.       Right eye: No discharge.        Left eye: No discharge.  Cardiovascular:     Rate and Rhythm: Normal rate and regular rhythm.     Pulses:  Normal pulses.     Heart sounds: Normal heart sounds.  Pulmonary:     Effort: Pulmonary effort is normal. No respiratory distress.     Breath sounds: Normal breath sounds. No stridor.  Abdominal:     General: Abdomen is flat. There is no distension.     Palpations: Abdomen is soft.     Tenderness: There is generalized abdominal tenderness.  Musculoskeletal:     Cervical back: No rigidity.     Right lower leg: No edema.     Left lower leg: No edema.  Skin:    General: Skin is warm and dry.     Capillary Refill: Capillary refill takes less than 2 seconds.  Neurological:     Mental Status: She is alert and oriented to person, place, and time.     GCS: GCS eye subscore is 4. GCS verbal subscore is 5. GCS motor subscore is 6.  Psychiatric:        Mood and Affect: Mood normal.        Behavior: Behavior normal. Behavior is cooperative.     ED Results and Treatments Labs (all labs ordered are listed, but only abnormal results are displayed) Labs Reviewed  COMPREHENSIVE METABOLIC PANEL - Abnormal; Notable for the following components:      Result Value   Chloride 90 (*)    Glucose, Bld 116 (*)    BUN 73 (*)    Creatinine, Ser 2.54 (*)    GFR, Estimated 19 (*)    All other components within normal limits  CBC WITH DIFFERENTIAL/PLATELET - Abnormal; Notable for the following components:   Hemoglobin 11.9 (*)    All other components within normal limits  SARS CORONAVIRUS 2 BY RT PCR  LIPASE, BLOOD  URINALYSIS, ROUTINE W REFLEX MICROSCOPIC  Radiology No results found.  Pertinent labs & imaging results that were available during my care of the patient were reviewed by me and considered in my medical decision making (see MDM for details).  Medications Ordered in ED Medications  sodium chloride 0.9 % bolus 1,000 mL (1,000 mLs Intravenous New Bag/Given 04/09/23  1443)  famotidine (PEPCID) IVPB 20 mg premix (20 mg Intravenous New Bag/Given 04/09/23 1446)  ondansetron (ZOFRAN) injection 4 mg (4 mg Intravenous Given 04/09/23 1444)                                                                                                                                     Procedures .Critical Care  Performed by: Sloan Leiter, DO Authorized by: Sloan Leiter, DO   Critical care provider statement:    Critical care time (minutes):  42   Critical care time was exclusive of:  Separately billable procedures and treating other patients   Critical care was necessary to treat or prevent imminent or life-threatening deterioration of the following conditions:  Renal failure   Critical care was time spent personally by me on the following activities:  Development of treatment plan with patient or surrogate, discussions with consultants, evaluation of patient's response to treatment, examination of patient, ordering and review of laboratory studies, ordering and review of radiographic studies, ordering and performing treatments and interventions, pulse oximetry, re-evaluation of patient's condition, review of old charts and obtaining history from patient or surrogate   (including critical care time)  Medical Decision Making / ED Course    Medical Decision Making:    Nicole Oconnor is a 77 y.o. female with past medical history as below, significant for hypertension, abdominal hysterectomy, gout who presents to the ED with complaint of abd pain, nausea, vomiting. The complaint involves an extensive differential diagnosis and also carries with it a high risk of complications and morbidity.  Serious etiology was considered. Ddx includes but is not limited to: Differential diagnosis includes but is not exclusive to acute cholecystitis, intrathoracic causes for epigastric abdominal pain, gastritis, duodenitis, pancreatitis, small bowel or large bowel obstruction, abdominal aortic  aneurysm, hernia, gastritis, etc. Differential diagnosis includes but is not exclusive to ectopic pregnancy, ovarian cyst, ovarian torsion, acute appendicitis, urinary tract infection, endometriosis, bowel obstruction, hernia, colitis, renal colic, gastroenteritis, volvulus etc.   Complete initial physical exam performed, notably the patient  was no acute distress, speaking fluently in full sentences, HDS.    Reviewed and confirmed nursing documentation for past medical history, family history, social history.  Vital signs reviewed.    Narrative: 77 year old female here with abdominal pain, nausea and vomiting with the past 2 days. Reduced p.o. intake, pt worried she is dehydrated  Clinical Course as of 04/09/23 1607  Thu Apr 09, 2023  1429 Creatinine(!): 2.54 [SG]  1429 BUN(!): 73 Marked dehydration, aki. Favor pre-renal given volume loss last 2 days. Give IVF [SG]  1430 BMP 11/23 with Cr 0.75 and BUN 31  [SG]  1430 Pt will require admission  [SG]    Clinical Course User Index [SG] Tanda Rockers A, DO   -Pt with AKI, likely 2/2 vomiting and poor PO -CT AP non con pending -IVF started  -pending CTAP at shift change, pt will require admission for AKI, pt/family agreeable.                   Additional history obtained: -Additional history obtained from family -External records from outside source obtained and reviewed including: Chart review including previous notes, labs, imaging, consultation notes including  Primary care recommendation, home medications, prior labs and imaging   Lab Tests: -I ordered, reviewed, and interpreted labs.   The pertinent results include:   Labs Reviewed  COMPREHENSIVE METABOLIC PANEL - Abnormal; Notable for the following components:      Result Value   Chloride 90 (*)    Glucose, Bld 116 (*)    BUN 73 (*)    Creatinine, Ser 2.54 (*)    GFR, Estimated 19 (*)    All other components within normal limits  CBC WITH  DIFFERENTIAL/PLATELET - Abnormal; Notable for the following components:   Hemoglobin 11.9 (*)    All other components within normal limits  SARS CORONAVIRUS 2 BY RT PCR  LIPASE, BLOOD  URINALYSIS, ROUTINE W REFLEX MICROSCOPIC    Notable for aki  EKG   EKG Interpretation Date/Time:    Ventricular Rate:    PR Interval:    QRS Duration:    QT Interval:    QTC Calculation:   R Axis:      Text Interpretation:           Imaging Studies ordered: I ordered imaging studies including CTAP I independently visualized the following imaging with scope of interpretation limited to determining acute life threatening conditions related to emergency care; findings noted above, significant for PENDING I independently visualized and interpreted imaging. I agree with the radiologist interpretation   Medicines ordered and prescription drug management: Meds ordered this encounter  Medications   sodium chloride 0.9 % bolus 1,000 mL   famotidine (PEPCID) IVPB 20 mg premix   ondansetron (ZOFRAN) injection 4 mg    -I have reviewed the patients home medicines and have made adjustments as needed   Consultations Obtained: na   Cardiac Monitoring: The patient was maintained on a cardiac monitor.  I personally viewed and interpreted the cardiac monitored which showed an underlying rhythm of: NSR  Social Determinants of Health:  Diagnosis or treatment significantly limited by social determinants of health: non smoker    Reevaluation: After the interventions noted above, I reevaluated the patient and found that they have improved  Co morbidities that complicate the patient evaluation  Past Medical History:  Diagnosis Date   Headache(784.0)    Frequently   History of measles, mumps, or rubella    Hypertension    Leaking of urine    Yeast infection       Dispostion: Disposition decision including need for hospitalization was considered, and patient disposition pending at time of  sign out.    Final Clinical Impression(s) / ED Diagnoses Final diagnoses:  AKI (acute kidney injury) (HCC)  Nausea and vomiting, unspecified vomiting type        Sloan Leiter, DO 04/09/23 1607

## 2023-04-09 NOTE — ED Notes (Signed)
Unsuccessful NG tube placement x2.  EDP Zackowski made aware.

## 2023-04-09 NOTE — ED Provider Notes (Addendum)
Patient without any further vomiting here but still feels nauseated.  Patient's lab workup was consistent with acute kidney injury with a GFR of 19.  Patient's had vomiting for a few days.  Associated with some abdominal pain.  CT scan of the abdomen is consistent with a dilated stomach duodenal and proximal small bowel up to 4.5 cm with a transition point in the left hemipelvis.  Developing or partial small bowel obstruction.  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  Will discuss with gastroenterology and will discuss with general surgery, medical admission for the acute kidney injury.   Vanetta Mulders, MD 04/09/23 1704  Discussed with Dr. Michaell Cowing on-call for general surgery.  This does not appear to be a proximal small bowel obstruction.  Seems to be halfway down the small bowel.  He will follow.  Recommended NG tube.  Will contact hospitalist for admission.  Sounds the best mission be at [facility].  Also did briefly discuss it with Dr. Ewing Schlein who is on-call for GI.  But this does not appear to be a proximal so he does not need to consult.    Vanetta Mulders, MD 04/09/23 1725

## 2023-04-09 NOTE — ED Triage Notes (Signed)
Emesis and abd pain x 2 days . Denies diarrhea , no further symptoms . Alert and oriented x 4 .

## 2023-04-09 NOTE — ED Notes (Signed)
[facility] at bedside 

## 2023-04-09 NOTE — Consult Note (Signed)
Nicole Oconnor  1946/02/25 [id number]  CARE TEAM:  PCP: Michaelle Birks [facility]  Outpatient Care Team: Patient Care Team: [place], [place] [facility] as PCP - General Betha Loa, Alphonsus Sias, MD as Referring Physician (General Surgery)  Inpatient Treatment Team: Treatment Team:  Vanetta Mulders, MD Altha Harm, Tiiu, RN [facility], Md, MD   This patient is a 77 y.o.female who presents today for surgical evaluation at the request of Dr Deretha Emory, [facility] ED.   Chief complaint / Reason for evaluation: Nausea vomiting bowel obstruction  Elderly with numerous abdominal surgeries.  Tubal ligation.  Hysterectomy.  Nephrectomy on the left side.  History of small bowel obstruction after knee revision requiring exploratory laparotomy lyse lesions October 2022.  Developed pheochromocytoma and underwent laparoscopic converted to open left adrenalectomy with significant lysis of adhesions [facility] March 2023.  Patient now comes in with nausea vomiting and abdominal pain.  Worsening over the past 48 hours.  Given concerns came to emergency department.  Suspicious for bowel obstruction.  CAT scan suggestive that as well with transition point down the pelvis.  Patient dehydrated with creatinine 2.54.  Giving IV fluids.  Medical and surgical consultations requested.  No sick contacts.  No history of Crohn's or colitis or inflammatory bowel disease.  No changes in diet or well water or travel.  Does have some hypertension and gout but no major foregut GI issues.   Assessment  Nicole Oconnor  77 y.o. female       Problem List:  Principal Problem:   SBO (small bowel obstruction) (HCC) Active Problems:   Essential hypertension   Gout   Edema   H/O total adrenalectomy (HCC)   Small bowel obstruction by history and CT scan  Plan:  IV fluid resuscitation for acute kidney injury.  NG tube decompression.  Small bowel protocol.  Agree with medical admission given advanced age and  other issues.    Surgery will help follow in consultation.  The patient is stable.  There is no evidence of peritonitis, acute abdomen, nor shock.  There is no need for surgery at the present moment.  We will continue to follow.     I reviewed nursing notes, ED provider notes, Consultant endocrine surgery and other operations in the chart notes, last 24 h vitals and pain scores, last 48 h intake and output, last 24 h labs and trends, and last 24 h imaging results. I have reviewed this patient's available data, including medical history, events of note, test results, etc as part of my evaluation.  A significant portion of that time was spent in counseling.  Care during the described time interval was provided by me.  This care required moderate level of medical decision making.  04/09/2023  Ardeth Sportsman, MD, FACS, MASCRS Esophageal, Gastrointestinal & Colorectal Surgery Robotic and Minimally Invasive Surgery  [facility] [address], [address] [place], [place] [address] [phone] Fax [phone] Main  CONTACT INFORMATION: Weekday (9AM-5PM): Call [facility] main office at [phone] Weeknight (5PM-9AM) or Weekend/Holiday: Check EPIC "Web Links" tab & use "AMION" (password " [email or web address]") for General Surgery [facility] coverage  Please, DO NOT use SecureChat  (it is not reliable communication to reach operating surgeons & will lead to a delay in care).   Epic staff messaging available for outptient concerns needing 1-2 business day response.      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   Solitary kidney, congenital 05/14/2021   Yeast infection     Past Surgical History:  Procedure Laterality Date   ABDOMINAL HYSTERECTOMY  08/25/1997   ADRENALECTOMY  Left 11/06/2021   [facility] - for pheochromocytoma   NEPHRECTOMY Left 01/23/1974   REPLACEMENT TOTAL KNEE BILATERAL     TUBAL LIGATION      Social History   Socioeconomic History   Marital status: Married    Spouse name: Not on file   Number of children: Not on file   Years of education: Not on file   Highest education level: Not on file  Occupational History   Occupation: retired  Tobacco Use   Smoking status: Never   Smokeless tobacco: Never  Vaping Use   Vaping status: Never Used  Substance and Sexual Activity   Alcohol use: Not Currently    Comment: occasional wine   Drug use: No   Sexual activity: Not Currently  Other Topics Concern   Not on file  Social History Narrative   Not on file   Social Determinants of Health   Financial Resource Strain: Low Risk  (06/14/2020)   Overall Financial Resource Strain (CARDIA)    Difficulty of Paying Living Expenses: Not hard at all  Food Insecurity: No Food Insecurity (06/14/2020)   Hunger Vital Sign    Worried About Running Out of Food in the Last Year: Never true    Ran Out of Food in the Last Year: Never true  Transportation Needs: Unmet Transportation Needs (07/03/2020)   PRAPARE - Administrator, Civil Service (Medical): Yes    Lack of Transportation (Non-Medical): No  Physical Activity: Inactive (06/14/2020)   Exercise Vital Sign    Days of Exercise per Week: 0 days    Minutes of Exercise per Session: 0 min  Stress: No Stress Concern Present (06/14/2020)   [employer] of Occupational Health - Occupational Stress Questionnaire    Feeling of Stress : Not at all  Social Connections: Not on file  Intimate Partner Violence: Not At Risk (12/30/2018)   Humiliation, Afraid, Rape, and Kick questionnaire    Fear of Current or Ex-Partner: No    Emotionally Abused: No    Physically Abused: No    Sexually Abused: No    Family History  Problem Relation Age of Onset   Breast cancer Sister     Current  Facility-Administered Medications  Medication Dose Route Frequency Provider Last Rate Last Admin   0.9 %  sodium chloride infusion   Intravenous Continuous Sloan Leiter, DO 125 mL/hr at 04/09/23 1619 New Bag at 04/09/23 1619   acetaminophen (TYLENOL) suppository 650 mg  650 mg Rectal Q6H PRN Karie Soda, MD       alum & mag hydroxide-simeth (MAALOX/MYLANTA) 200-200-20 MG/5ML suspension 30 mL  30 mL Oral Q6H PRN Karie Soda, MD       bisacodyl (DULCOLAX) suppository 10 mg  10 mg Rectal Daily Karie Soda, MD       diatrizoate meglumine-sodium (GASTROGRAFIN) 66-10 % solution 90 mL  90 mL Per NG tube Once Karie Soda, MD       fentaNYL (SUBLIMAZE) injection 25-50 mcg  25-50 mcg Intravenous Q1H PRN Karie Soda, MD       lactated ringers bolus 1,000 mL  1,000 mL Intravenous Once  Karie Soda, MD       lactated ringers bolus 1,000 mL  1,000 mL Intravenous Q8H PRN Karie Soda, MD       magic mouthwash  15 mL Oral QID PRN Karie Soda, MD       menthol-cetylpyridinium (CEPACOL) lozenge 3 mg  1 lozenge Oral PRN Karie Soda, MD       methocarbamol (ROBAXIN) 1,000 mg in dextrose 5 % 100 mL IVPB  1,000 mg Intravenous Q6H PRN Karie Soda, MD       naphazoline-glycerin (CLEAR EYES REDNESS) ophth solution 1-2 drop  1-2 drop Both Eyes QID PRN Karie Soda, MD       ondansetron [facility]) injection 4 mg  4 mg Intravenous Q6H PRN Karie Soda, MD       Or   ondansetron (ZOFRAN) 8 mg in sodium chloride 0.9 % 50 mL IVPB  8 mg Intravenous Q6H PRN Karie Soda, MD       phenol (CHLORASEPTIC) mouth spray 2 spray  2 spray Mouth/Throat PRN Karie Soda, MD       prochlorperazine (COMPAZINE) injection 5-10 mg  5-10 mg Intravenous Q4H PRN Karie Soda, MD       simethicone (MYLICON) 40 MG/0.6ML suspension 80 mg  80 mg Oral QID PRN Karie Soda, MD       sodium chloride (OCEAN) 0.65 % nasal spray 1-2 spray  1-2 spray Each Nare Q6H PRN Karie Soda, MD       Current Outpatient Medications   Medication Sig Dispense Refill   allopurinol (ZYLOPRIM) 300 MG tablet TAKE 1 TABLET BY MOUTH EVERY DAY 90 tablet 1   aspirin 81 MG tablet Take 81 mg by mouth daily.     atenolol (TENORMIN) 50 MG tablet TAKE 1 TABLET BY MOUTH EVERY DAY 90 tablet 1   loratadine (CLARITIN) 10 MG tablet TAKE 1 TABLET BY MOUTH EVERY DAY 90 tablet 1   olmesartan-hydrochlorothiazide (BENICAR HCT) 40-12.5 MG tablet TAKE 1 TABLET BY MOUTH EVERY DAY 90 tablet 1     Allergies  Allergen Reactions   Penicillins      BP 106/70   Pulse 79   Temp 98.3 F (36.8 C) (Oral)   Resp 16   SpO2 95%     Results:   Labs: Results for orders placed or performed during the hospital encounter of 04/09/23 (from the past 48 hour(s))  Lipase, blood     Status: None   Collection Time: 04/09/23  1:56 PM  Result Value Ref Range   Lipase 42 11 - 51 U/L    Comment: Performed at [facility], [address] [place], [place] [address]  Comprehensive metabolic panel     Status: Abnormal   Collection Time: 04/09/23  1:56 PM  Result Value Ref Range   Sodium 135 135 - 145 mmol/L   Potassium 4.1 3.5 - 5.1 mmol/L   Chloride 90 (L) 98 - 111 mmol/L   CO2 30 22 - 32 mmol/L   Glucose, Bld 116 (H) 70 - 99 mg/dL    Comment: Glucose reference range applies only to samples taken after fasting for at least 8 hours.   BUN 73 (H) 8 - 23 mg/dL   Creatinine, Ser 6.04 (H) 0.44 - 1.00 mg/dL   Calcium 9.8 8.9 - 54.0 mg/dL   Total Protein 8.1 6.5 - 8.1 g/dL   Albumin 4.5 3.5 - 5.0 g/dL   AST 22 15 - 41 U/L   ALT 15 0 - 44 U/L  Alkaline Phosphatase 89 38 - 126 U/L   Total Bilirubin 0.7 0.3 - 1.2 mg/dL   GFR, Estimated 19 (L) >60 mL/min    Comment: (NOTE) Calculated using the CKD-EPI Creatinine Equation (2021)    Anion gap 15 5 - 15    Comment: Performed at [facility], [address] [place], [place] [address]  CBC with Differential     Status: Abnormal   Collection Time: 04/09/23  1:56 PM  Result Value Ref  Range   WBC 5.8 4.0 - 10.5 K/uL   RBC 4.32 3.87 - 5.11 MIL/uL   Hemoglobin 11.9 (L) 12.0 - 15.0 g/dL   HCT 78.2 95.6 - 21.3 %   MCV 84.5 80.0 - 100.0 fL   MCH 27.5 26.0 - 34.0 pg   MCHC 32.6 30.0 - 36.0 g/dL   RDW 08.6 57.8 - 46.9 %   Platelets 189 150 - 400 K/uL   nRBC 0.0 0.0 - 0.2 %   Neutrophils Relative % 74 %   Neutro Abs 4.3 1.7 - 7.7 K/uL   Lymphocytes Relative 12 %   Lymphs Abs 0.7 0.7 - 4.0 K/uL   Monocytes Relative 13 %   Monocytes Absolute 0.8 0.1 - 1.0 K/uL   Eosinophils Relative 0 %   Eosinophils Absolute 0.0 0.0 - 0.5 K/uL   Basophils Relative 0 %   Basophils Absolute 0.0 0.0 - 0.1 K/uL   Immature Granulocytes 1 %   Abs Immature Granulocytes 0.05 0.00 - 0.07 K/uL    Comment: Performed at [facility], [address] [place], [place] [address]  SARS Coronavirus 2 by RT PCR (hospital order, performed in [facility] hospital lab) *cepheid single result test* Anterior Nasal Swab     Status: None   Collection Time: 04/09/23  2:47 PM   Specimen: Anterior Nasal Swab  Result Value Ref Range   SARS Coronavirus 2 by RT PCR NEGATIVE NEGATIVE    Comment: (NOTE) SARS-CoV-2 target nucleic acids are NOT DETECTED.  The SARS-CoV-2 RNA is generally detectable in upper and lower respiratory specimens during the acute phase of infection. The lowest concentration of SARS-CoV-2 viral copies this assay can detect is 250 copies / mL. A negative result does not preclude SARS-CoV-2 infection and should not be used as the sole basis for treatment or other patient management decisions.  A negative result may occur with improper specimen collection / handling, submission of specimen other than nasopharyngeal swab, presence of viral mutation(s) within the areas targeted by this assay, and inadequate number of viral copies (<250 copies / mL). A negative result must be combined with clinical observations, patient history, and epidemiological information.  Fact Sheet for  Patients:   RoadLapTop.co.za  Fact Sheet for Healthcare Providers: http://kim-miller.com/  This test is not yet approved or  cleared by the [place] FDA and has been authorized for detection and/or diagnosis of SARS-CoV-2 by FDA under an Emergency Use Authorization (EUA).  This EUA will remain in effect (meaning this test can be used) for the duration of the COVID-19 declaration under Section 564(b)(1) of the Act, 21 U.S.C. section 360bbb-3(b)(1), unless the authorization is terminated or revoked sooner.  Performed at [facility], [address] [place], [place] [address]     Imaging / Studies: CT ABDOMEN PELVIS WO CONTRAST  Result Date: 04/09/2023 CLINICAL DATA:  Emesis and abdominal pain for 2 days. EXAM: CT ABDOMEN AND PELVIS WITHOUT CONTRAST TECHNIQUE: Multidetector CT imaging of  the abdomen and pelvis was performed following the standard protocol without IV contrast. RADIATION DOSE REDUCTION: This exam was performed according to the departmental dose-optimization program which includes automated exposure control, adjustment of the mA and/or kV according to patient size and/or use of iterative reconstruction technique. COMPARISON:  None Available. FINDINGS: Lower chest: Slight linear opacity lung bases likely scar or atelectasis. No pleural effusion. Coronary artery calcifications are seen. Hepatobiliary: With the limits of non IV contrast, grossly the hepatic parenchyma is preserved. Gallbladder is nondilated. Pancreas: Unremarkable. No pancreatic ductal dilatation or surrounding inflammatory changes. Spleen: Normal in size without focal abnormality. Adrenals/Urinary Tract: The right adrenal gland is preserved. No abnormal calcification seen in the right kidney nor along the course of the right ureter. Previous resection of the left adrenal gland and left kidney is noted. No abnormal soft tissue in the surgical bed on this  noncontrast exam. Preserved contours of the underdistended urinary bladder. Stomach/Bowel: Dilated stomach with air, fluid and debris. The duodenal is also dilated as is the proximal small bowel, jejunum. There is transition point in the left hemipelvis. No wall thickening or inflammatory changes. No pneumatosis. The distal small bowel and colon are decompressed. Scattered colonic stool. Vascular/Lymphatic: Aortic atherosclerosis. No enlarged abdominal or pelvic lymph nodes. Reproductive: Uterus and bilateral adnexa are unremarkable. Other: Patient is tilted in the scanner. There is motion throughout the examination limiting evaluation. Musculoskeletal: Moderate degenerative changes of the spine and pelvis. Trace anterolisthesis of L4 on L5. IMPRESSION: Dilated stomach, duodenal and proximal small bowel up to 4.5 cm with a transition point in the left hemipelvis. Developing or partial obstruction. No free air, pneumatosis or free fluid. Surgical changes from previous left adrenalectomy and nephrectomy. Please correlate with clinical history. Electronically Signed   By: Karen Kays M.D.   On: 04/09/2023 16:48    Medications / Allergies: per chart  Antibiotics: Anti-infectives (From admission, onward)    None         Note: Portions of this report may have been transcribed using voice recognition software. Every effort was made to ensure accuracy; however, inadvertent computerized transcription errors may be present.   Any transcriptional errors that result from this process are unintentional.    Ardeth Sportsman, MD, FACS, MASCRS Esophageal, Gastrointestinal & Colorectal Surgery Robotic and Minimally Invasive Surgery  [facility] [address] [place], [place] [address] [phone] Fax [phone] Main  CONTACT INFORMATION: Weekday (9AM-5PM): Call [facility] main office at [phone] Weeknight (5PM-9AM) or Weekend/Holiday:  Check EPIC "Web Links" tab & use "AMION" (password " [email or web address]") for General Surgery [facility] coverage  Please, DO NOT use SecureChat  (it is not reliable communication to reach operating surgeons & will lead to a delay in care).   Epic staff messaging available for outptient concerns needing 1-2 business day response.       04/09/2023  5:24 PM

## 2023-04-10 ENCOUNTER — Inpatient Hospital Stay (HOSPITAL_COMMUNITY): Payer: Medicare Other

## 2023-04-10 DIAGNOSIS — N179 Acute kidney failure, unspecified: Secondary | ICD-10-CM | POA: Diagnosis not present

## 2023-04-10 DIAGNOSIS — K56609 Unspecified intestinal obstruction, unspecified as to partial versus complete obstruction: Secondary | ICD-10-CM | POA: Diagnosis not present

## 2023-04-10 DIAGNOSIS — R112 Nausea with vomiting, unspecified: Secondary | ICD-10-CM | POA: Diagnosis not present

## 2023-04-10 LAB — CBC
HCT: 32.3 % — ABNORMAL LOW (ref 36.0–46.0)
Hemoglobin: 10.4 g/dL — ABNORMAL LOW (ref 12.0–15.0)
MCH: 28 pg (ref 26.0–34.0)
MCHC: 32.2 g/dL (ref 30.0–36.0)
MCV: 87.1 fL (ref 80.0–100.0)
Platelets: 168 10*3/uL (ref 150–400)
RBC: 3.71 MIL/uL — ABNORMAL LOW (ref 3.87–5.11)
RDW: 14.6 % (ref 11.5–15.5)
WBC: 6.8 10*3/uL (ref 4.0–10.5)
nRBC: 0 % (ref 0.0–0.2)

## 2023-04-10 LAB — BASIC METABOLIC PANEL
Anion gap: 11 (ref 5–15)
BUN: 66 mg/dL — ABNORMAL HIGH (ref 8–23)
CO2: 26 mmol/L (ref 22–32)
Calcium: 8.6 mg/dL — ABNORMAL LOW (ref 8.9–10.3)
Chloride: 97 mmol/L — ABNORMAL LOW (ref 98–111)
Creatinine, Ser: 1.76 mg/dL — ABNORMAL HIGH (ref 0.44–1.00)
GFR, Estimated: 29 mL/min — ABNORMAL LOW (ref >60)
Glucose, Bld: 104 mg/dL — ABNORMAL HIGH (ref 70–99)
Potassium: 3.2 mmol/L — ABNORMAL LOW (ref 3.5–5.1)
Sodium: 134 mmol/L — ABNORMAL LOW (ref 135–145)

## 2023-04-10 MED ORDER — POTASSIUM CHLORIDE 10 MEQ/100ML IV SOLN
10.0000 meq | INTRAVENOUS | Status: AC
  Administered 2023-04-10 (×4): 10 meq via INTRAVENOUS
  Filled 2023-04-10 (×4): qty 100

## 2023-04-10 MED ORDER — DIATRIZOATE MEGLUMINE & SODIUM 66-10 % PO SOLN
90.0000 mL | Freq: Once | ORAL | Status: AC
  Administered 2023-04-10: 90 mL via NASOGASTRIC
  Filled 2023-04-10: qty 90

## 2023-04-10 NOTE — Plan of Care (Signed)
Plan of care initiated and reviewed.

## 2023-04-10 NOTE — Plan of Care (Signed)
  Problem: Coping: Goal: Level of anxiety will decrease Outcome: Progressing   Problem: Pain Managment: Goal: General experience of comfort will improve Outcome: Progressing   

## 2023-04-10 NOTE — Progress Notes (Signed)
Progress Note     Subjective: Pt reports they were unable to place NGT at [facility]. She denies significant abdominal pain, nausea or vomiting this AM. She said she is spitting up some phlegm. She is not passing flatus or BM. She ambulates with a walker at baseline.   Objective: Vital signs in last 24 hours: Temp:  [98.1 F (36.7 C)-99.1 F (37.3 C)] 98.1 F (36.7 C) (08/16 0539) Pulse Rate:  [75-98] 98 (08/16 0539) Resp:  [16-18] 16 (08/16 0539) BP: (88-117)/(47-71) 88/53 (08/16 0539) SpO2:  [92 %-97 %] 92 % (08/16 0539) Weight:  [83.9 kg] 83.9 kg (08/15 1939) Last BM Date : 04/08/23  Intake/Output from previous day: 08/15 0701 - 08/16 0700 In: 2340.6 [I.V.:295.4; IV Piggyback:2045.2] Out: -  Intake/Output this shift: Total I/O In: 243.3 [IV Piggyback:243.3] Out: -   PE: General: pleasant, WD, elderly female who is laying in bed in NAD Heart: regular, rate, and rhythm.  Lungs: CTAB, no wheezes, rhonchi, or rales noted.  Respiratory effort nonlabored Abd: soft, NT, ND, BS hypoactive Psych: A&Ox3 with an appropriate affect.    Lab Results:  Recent Labs    04/09/23 1356 04/10/23 0345  WBC 5.8 6.8  HGB 11.9* 10.4*  HCT 36.5 32.3*  PLT 189 168   BMET Recent Labs    04/09/23 1356 04/10/23 0345  NA 135 134*  K 4.1 3.2*  CL 90* 97*  CO2 30 26  GLUCOSE 116* 104*  BUN 73* 66*  CREATININE 2.54* 1.76*  CALCIUM 9.8 8.6*   PT/INR No results for input(s): "LABPROT", "INR" in the last 72 hours. CMP     Component Value Date/Time   NA 134 (L) 04/10/2023 0345   NA 144 03/03/2018 0000   K 3.2 (L) 04/10/2023 0345   CL 97 (L) 04/10/2023 0345   CO2 26 04/10/2023 0345   GLUCOSE 104 (H) 04/10/2023 0345   BUN 66 (H) 04/10/2023 0345   BUN 15 03/03/2018 0000   CREATININE 1.76 (H) 04/10/2023 0345   CALCIUM 8.6 (L) 04/10/2023 0345   PROT 8.1 04/09/2023 1356   ALBUMIN 4.5 04/09/2023 1356   AST 22 04/09/2023 1356   ALT 15 04/09/2023 1356   ALKPHOS 89 04/09/2023 1356    BILITOT 0.7 04/09/2023 1356   GFRNONAA 29 (L) 04/10/2023 0345   Lipase     Component Value Date/Time   LIPASE 42 04/09/2023 1356       Studies/Results: CT ABDOMEN PELVIS WO CONTRAST  Result Date: 04/09/2023 CLINICAL DATA:  Emesis and abdominal pain for 2 days. EXAM: CT ABDOMEN AND PELVIS WITHOUT CONTRAST TECHNIQUE: Multidetector CT imaging of the abdomen and pelvis was performed following the standard protocol without IV contrast. RADIATION DOSE REDUCTION: This exam was performed according to the departmental dose-optimization program which includes automated exposure control, adjustment of the mA and/or kV according to patient size and/or use of iterative reconstruction technique. COMPARISON:  None Available. FINDINGS: Lower chest: Slight linear opacity lung bases likely scar or atelectasis. No pleural effusion. Coronary artery calcifications are seen. Hepatobiliary: With the limits of non IV contrast, grossly the hepatic parenchyma is preserved. Gallbladder is nondilated. Pancreas: Unremarkable. No pancreatic ductal dilatation or surrounding inflammatory changes. Spleen: Normal in size without focal abnormality. Adrenals/Urinary Tract: The right adrenal gland is preserved. No abnormal calcification seen in the right kidney nor along the course of the right ureter. Previous resection of the left adrenal gland and left kidney is noted. No abnormal soft tissue in the surgical bed on  this noncontrast exam. Preserved contours of the underdistended urinary bladder. Stomach/Bowel: Dilated stomach with air, fluid and debris. The duodenal is also dilated as is the proximal small bowel, jejunum. There is transition point in the left hemipelvis. No wall thickening or inflammatory changes. No pneumatosis. The distal small bowel and colon are decompressed. Scattered colonic stool. Vascular/Lymphatic: Aortic atherosclerosis. No enlarged abdominal or pelvic lymph nodes. Reproductive: Uterus and bilateral adnexa  are unremarkable. Other: Patient is tilted in the scanner. There is motion throughout the examination limiting evaluation. Musculoskeletal: Moderate degenerative changes of the spine and pelvis. Trace anterolisthesis of L4 on L5. IMPRESSION: Dilated stomach, duodenal and proximal small bowel up to 4.5 cm with a transition point in the left hemipelvis. Developing or partial obstruction. No free air, pneumatosis or free fluid. Surgical changes from previous left adrenalectomy and nephrectomy. Please correlate with clinical history. Electronically Signed   By: Karen Kays M.D.   On: 04/09/2023 16:48    Anti-infectives: Anti-infectives (From admission, onward)    None        Assessment/Plan SBO - multiple prior abdominal surgeries including LOA for SBO, most recent was open left adrenalectomy last year at [facility] - patient did not end up getting NGT placed prior to transfer to [facility] - abdominal exam fairly benign this AM and patient denies nausea and vomiting - will check KUB - if stomach and small bowel still significantly dilated then would recommend proceeding with NGT placement and SBO protocol. If KUB appears improved from CT yesterday could consider PO protocol vs clears  - no indication for emergent surgical intervention based on exam this AM.   FEN: NPO, IVF per TRH VTE: SQH ID: no current abx  - per [facility] -   HTN Hx of pheochromocytoma s/p open left adrenalectomy  AKI on CKD - pt is s/p left nephrectomy 1975  LOS: 1 day   I reviewed ED provider notes, hospitalist notes, last 24 h vitals and pain scores, last 48 h intake and output, last 24 h labs and trends, and last 24 h imaging results   Juliet Rude, [facility] 04/10/2023, 8:58 AM Please see Amion for pager number during day hours 7:00am-4:30pm

## 2023-04-10 NOTE — Progress Notes (Signed)
   04/10/23 1035  TOC Brief Assessment  Insurance and Status Reviewed  Patient has primary care physician Yes  Home environment has been reviewed home  Prior level of function: independent  Prior/Current Home Services No current home services  Social Determinants of Health Reivew SDOH reviewed interventions complete (added transportation resources to AVS)  Readmission risk has been reviewed Yes  Transition of care needs no transition of care needs at this time

## 2023-04-10 NOTE — Progress Notes (Signed)
[facility]                                                                              Nicole Oconnor, is a 77 y.o. female, DOB - 03-May-1946, YTK:[id number] Admit date - 04/09/2023    Outpatient Primary MD for the patient is Erling Cruz, [facility]  LOS - 1  days  Chief Complaint  Patient presents with   Emesis       Brief summary   Patient is a 77 year old female with pheochromocytoma s/p left adrenalectomy in 10/2021, solitary right kidney, history of SBO s/p ex lap and lysis of additions in 05/2021, HTN presented with abdominal pain, nausea and vomiting for last 3 days.  Patient reported last BM on 8/13, since then persistent nausea and vomiting, unable to maintain p.o. intake. In ED, found to have SBO, attempt to place an NG tube in ED was unsuccessful.  Admitted for further workup.  Assessment & Plan    Principal Problem:   SBO (small bowel obstruction) (HCC) -Feeling nauseous, has prior history of SBO with ex lap and lysis of adhesions in 05/2021 -NGT was unsuccessful in ED, still no BM or flatulence since 8/13 -Continue n.p.o., IV fluids, pain control, surgery consulted.   Active Problems:   Essential hypertension -BP soft, continue IV fluids -hold oral antihypertensives (outpatient on olmesartan, HCTZ, atenolol)  Acute kidney injury -Creatinine 2.54 on admission, was 0.75 in 06/2022 -Likely prerenal due to poor p.o. intake, SBO -Continue IV fluid hydration, creatinine improving, 1.7  Hypokalemia -Replaced IV  Obesity Estimated body mass index is 32.77 kg/m as calculated from the following:   Height as of this encounter: 5\' 3"  (1.6 m).   Weight as of this encounter: 83.9 kg.  Code Status: Full code DVT Prophylaxis:  heparin injection 5,000 Units Start: 04/09/23 2200   Level of Care: Level of care: Med-Surg Family Communication: Updated patient Disposition Plan:      Remains inpatient appropriate:    Procedures:  None  Consultants:    General Surgery  Antimicrobials:   Anti-infectives (From admission, onward)    None          Medications  bisacodyl  10 mg Rectal Daily   diatrizoate meglumine-sodium  90 mL Per NG tube Once   heparin  5,000 Units Subcutaneous Q8H      Subjective:   Nicole Oconnor was seen and examined today.  Still having nausea and dry heaving.  No acute ongoing vomiting.  Still no BM/flatus.  Patient denies dizziness, chest pain, shortness of breat, new weakness, numbess, tingling. No acute events overnight.    Objective:   Vitals:   04/09/23 1939 04/09/23 2208 04/10/23 0215 04/10/23 0539  BP: (!) 93/47 94/61 (!) 90/54 (!) 88/53  Pulse: 75 90 93 98  Resp: 17 17 17 16   Temp: 98.4 F (36.9 C) 98.4 F (36.9 C) 99.1 F (37.3 C) 98.1 F (36.7 C)  TempSrc:  Oral Oral Oral  SpO2: 94% 93% 95% 92%  Weight: 83.9 kg     Height: 5\' 3"  (1.6 m)       Intake/Output Summary (Last 24 hours) at 04/10/2023 1212  Last data filed at 04/10/2023 0800 Gross per 24 hour  Intake 2583.94 ml  Output --  Net 2583.94 ml     Wt Readings from Last 3 Encounters:  04/09/23 83.9 kg  06/14/20 79.4 kg  12/30/18 111.1 kg     Exam General: Alert and oriented x 3, NAD Cardiovascular: S1 S2 auscultated,  RRR Respiratory: Clear to auscultation bilaterally, no wheezing Gastrointestinal: Soft, nontender, nondistended, hypoactive BS Ext: no pedal edema bilaterally Neuro: no new deficits Psych: Normal affect     Data Reviewed:  I have personally reviewed following labs    CBC Lab Results  Component Value Date   WBC 6.8 04/10/2023   RBC 3.71 (L) 04/10/2023   HGB 10.4 (L) 04/10/2023   HCT 32.3 (L) 04/10/2023   MCV 87.1 04/10/2023   MCH 28.0 04/10/2023   PLT 168 04/10/2023   MCHC 32.2 04/10/2023   RDW 14.6 04/10/2023   LYMPHSABS 0.7 04/09/2023   MONOABS 0.8 04/09/2023   EOSABS 0.0 04/09/2023   BASOSABS 0.0 04/09/2023     Last metabolic panel Lab Results  Component Value Date   NA 134  (L) 04/10/2023   K 3.2 (L) 04/10/2023   CL 97 (L) 04/10/2023   CO2 26 04/10/2023   BUN 66 (H) 04/10/2023   CREATININE 1.76 (H) 04/10/2023   GLUCOSE 104 (H) 04/10/2023   GFRNONAA 29 (L) 04/10/2023   CALCIUM 8.6 (L) 04/10/2023   PROT 8.1 04/09/2023   ALBUMIN 4.5 04/09/2023   BILITOT 0.7 04/09/2023   ALKPHOS 89 04/09/2023   AST 22 04/09/2023   ALT 15 04/09/2023   ANIONGAP 11 04/10/2023    CBG (last 3)  No results for input(s): "GLUCAP" in the last 72 hours.    Coagulation Profile: No results for input(s): "INR", "PROTIME" in the last 168 hours.   Radiology Studies: I have personally reviewed the imaging studies  DG Abd Portable 1V  Result Date: 04/10/2023 CLINICAL DATA:  SBO (small bowel obstruction) (HCC) EXAM: PORTABLE ABDOMEN - 1 VIEW COMPARISON:  CT abdomen/pelvis 04/09/2023. FINDINGS: Similar marked dilation of proximal bowel loops, compatible with known obstruction better characterized on recent CT. No obvious evidence of free air on this limited supine radiograph. No abnormal calcifications. Left upper quadrant clips. Polyarticular degenerative change. IMPRESSION: Similar marked dilation of proximal bowel loops, compatible with known obstruction better characterized on recent CT. Electronically Signed   By: Feliberto Harts M.D.   On: 04/10/2023 09:08   CT ABDOMEN PELVIS WO CONTRAST  Result Date: 04/09/2023 CLINICAL DATA:  Emesis and abdominal pain for 2 days. EXAM: CT ABDOMEN AND PELVIS WITHOUT CONTRAST TECHNIQUE: Multidetector CT imaging of the abdomen and pelvis was performed following the standard protocol without IV contrast. RADIATION DOSE REDUCTION: This exam was performed according to the departmental dose-optimization program which includes automated exposure control, adjustment of the mA and/or kV according to patient size and/or use of iterative reconstruction technique. COMPARISON:  None Available. FINDINGS: Lower chest: Slight linear opacity lung bases likely scar  or atelectasis. No pleural effusion. Coronary artery calcifications are seen. Hepatobiliary: With the limits of non IV contrast, grossly the hepatic parenchyma is preserved. Gallbladder is nondilated. Pancreas: Unremarkable. No pancreatic ductal dilatation or surrounding inflammatory changes. Spleen: Normal in size without focal abnormality. Adrenals/Urinary Tract: The right adrenal gland is preserved. No abnormal calcification seen in the right kidney nor along the course of the right ureter. Previous resection of the left adrenal gland and left kidney is noted. No abnormal soft tissue in  the surgical bed on this noncontrast exam. Preserved contours of the underdistended urinary bladder. Stomach/Bowel: Dilated stomach with air, fluid and debris. The duodenal is also dilated as is the proximal small bowel, jejunum. There is transition point in the left hemipelvis. No wall thickening or inflammatory changes. No pneumatosis. The distal small bowel and colon are decompressed. Scattered colonic stool. Vascular/Lymphatic: Aortic atherosclerosis. No enlarged abdominal or pelvic lymph nodes. Reproductive: Uterus and bilateral adnexa are unremarkable. Other: Patient is tilted in the scanner. There is motion throughout the examination limiting evaluation. Musculoskeletal: Moderate degenerative changes of the spine and pelvis. Trace anterolisthesis of L4 on L5. IMPRESSION: Dilated stomach, duodenal and proximal small bowel up to 4.5 cm with a transition point in the left hemipelvis. Developing or partial obstruction. No free air, pneumatosis or free fluid. Surgical changes from previous left adrenalectomy and nephrectomy. Please correlate with clinical history. Electronically Signed   By: Karen Kays M.D.   On: 04/09/2023 16:48       Cloee Dunwoody M.D. [facility] 04/10/2023, 12:12 PM  Available via Epic secure chat 7am-7pm After 7 pm, please refer to night coverage provider listed on amion.

## 2023-04-11 ENCOUNTER — Inpatient Hospital Stay (HOSPITAL_COMMUNITY): Payer: Medicare Other

## 2023-04-11 DIAGNOSIS — N179 Acute kidney failure, unspecified: Secondary | ICD-10-CM | POA: Diagnosis not present

## 2023-04-11 DIAGNOSIS — R112 Nausea with vomiting, unspecified: Secondary | ICD-10-CM | POA: Diagnosis not present

## 2023-04-11 DIAGNOSIS — K56609 Unspecified intestinal obstruction, unspecified as to partial versus complete obstruction: Secondary | ICD-10-CM | POA: Diagnosis not present

## 2023-04-11 LAB — CBC
HCT: 38.5 % (ref 36.0–46.0)
Hemoglobin: 12.1 g/dL (ref 12.0–15.0)
MCH: 27.7 pg (ref 26.0–34.0)
MCHC: 31.4 g/dL (ref 30.0–36.0)
MCV: 88.1 fL (ref 80.0–100.0)
Platelets: 166 10*3/uL (ref 150–400)
RBC: 4.37 MIL/uL (ref 3.87–5.11)
RDW: 14.7 % (ref 11.5–15.5)
WBC: 15.1 10*3/uL — ABNORMAL HIGH (ref 4.0–10.5)
nRBC: 0 % (ref 0.0–0.2)

## 2023-04-11 LAB — BASIC METABOLIC PANEL
Anion gap: 14 (ref 5–15)
BUN: 66 mg/dL — ABNORMAL HIGH (ref 8–23)
CO2: 29 mmol/L (ref 22–32)
Calcium: 9.2 mg/dL (ref 8.9–10.3)
Chloride: 99 mmol/L (ref 98–111)
Creatinine, Ser: 1.3 mg/dL — ABNORMAL HIGH (ref 0.44–1.00)
GFR, Estimated: 42 mL/min — ABNORMAL LOW (ref >60)
Glucose, Bld: 77 mg/dL (ref 70–99)
Potassium: 3.3 mmol/L — ABNORMAL LOW (ref 3.5–5.1)
Sodium: 142 mmol/L (ref 135–145)

## 2023-04-11 MED ORDER — POTASSIUM CHLORIDE 10 MEQ/100ML IV SOLN
10.0000 meq | INTRAVENOUS | Status: AC
  Administered 2023-04-11 (×4): 10 meq via INTRAVENOUS
  Filled 2023-04-11 (×4): qty 100

## 2023-04-11 MED ORDER — LACTATED RINGERS IV SOLN: INTRAVENOUS | Status: DC

## 2023-04-11 NOTE — Progress Notes (Signed)
Subjective/Chief Complaint: Large volume NG output No nausea or vomiting No flatus or BM Feels less distended   Objective: Vital signs in last 24 hours: Temp:  [98.5 F (36.9 C)-99.6 F (37.6 C)] 98.9 F (37.2 C) (08/17 0507) Pulse Rate:  [69-93] 92 (08/17 0507) Resp:  [16-18] 16 (08/17 0507) BP: (92-103)/(55-64) 103/64 (08/17 0507) SpO2:  [91 %-92 %] 91 % (08/17 0507) Weight:  [82.2 kg] 82.2 kg (08/17 0500) Last BM Date : 04/08/23  Intake/Output from previous day: 08/16 0701 - 08/17 0700 In: 635.5 [P.O.:90; IV Piggyback:545.5] Out: 4900 [Urine:600; Emesis/NG output:4300] Intake/Output this shift: No intake/output data recorded.  NG functioning - bilious output Abd - soft, non-distended; non-tender  Lab Results:  Recent Labs    04/10/23 0345 04/11/23 0808  WBC 6.8 15.1*  HGB 10.4* 12.1  HCT 32.3* 38.5  PLT 168 166   BMET Recent Labs    04/10/23 0345 04/11/23 0808  NA 134* 142  K 3.2* 3.3*  CL 97* 99  CO2 26 29  GLUCOSE 104* 77  BUN 66* 66*  CREATININE 1.76* 1.30*  CALCIUM 8.6* 9.2   PT/INR No results for input(s): "LABPROT", "INR" in the last 72 hours. ABG No results for input(s): "PHART", "HCO3" in the last 72 hours.  Invalid input(s): "PCO2", "PO2"  Studies/Results: DG Abd Portable 1V-Small Bowel Obstruction Protocol-initial, 8 hr delay  Result Date: 04/11/2023 CLINICAL DATA:  Small-bowel obstruction, 8 hour delayed examination EXAM: PORTABLE ABDOMEN - 1 VIEW COMPARISON:  04/10/2023, CT 04/09/2023 FINDINGS: Administered barium contrast is seen within the expected gastric and duodenal lumen but does not appear to have progressed into the proximal small bowel. Nasogastric tube overlies the gastric lumen. Abdominal gas pattern is unchanged. No gross free intraperitoneal gas. IMPRESSION: 1. Persistent small bowel obstruction. Administered oral contrast opacifies the gastric and proximal duodenal lumen. Electronically Signed   By: Helyn Numbers M.D.    On: 04/11/2023 01:29   DG Abd Portable 1V-Small Bowel Protocol-Position Verification  Result Date: 04/10/2023 CLINICAL DATA:  [id number] Encounter for imaging study to confirm nasogastric (NG) tube placement [id number]. EXAM: PORTABLE ABDOMEN - 1 VIEW COMPARISON:  04/10/2023 at 0851 hours. FINDINGS: Enteric tube tip and side port project over the stomach. IMPRESSION: Enteric tube tip and side port project over the stomach. Electronically Signed   By: Orvan Falconer M.D.   On: 04/10/2023 14:55   DG Abd Portable 1V  Result Date: 04/10/2023 CLINICAL DATA:  SBO (small bowel obstruction) (HCC) EXAM: PORTABLE ABDOMEN - 1 VIEW COMPARISON:  CT abdomen/pelvis 04/09/2023. FINDINGS: Similar marked dilation of proximal bowel loops, compatible with known obstruction better characterized on recent CT. No obvious evidence of free air on this limited supine radiograph. No abnormal calcifications. Left upper quadrant clips. Polyarticular degenerative change. IMPRESSION: Similar marked dilation of proximal bowel loops, compatible with known obstruction better characterized on recent CT. Electronically Signed   By: Feliberto Harts M.D.   On: 04/10/2023 09:08   CT ABDOMEN PELVIS WO CONTRAST  Result Date: 04/09/2023 CLINICAL DATA:  Emesis and abdominal pain for 2 days. EXAM: CT ABDOMEN AND PELVIS WITHOUT CONTRAST TECHNIQUE: Multidetector CT imaging of the abdomen and pelvis was performed following the standard protocol without IV contrast. RADIATION DOSE REDUCTION: This exam was performed according to the departmental dose-optimization program which includes automated exposure control, adjustment of the mA and/or kV according to patient size and/or use of iterative reconstruction technique. COMPARISON:  None Available. FINDINGS: Lower chest: Slight linear opacity lung bases likely scar  or atelectasis. No pleural effusion. Coronary artery calcifications are seen. Hepatobiliary: With the limits of non IV contrast, grossly the  hepatic parenchyma is preserved. Gallbladder is nondilated. Pancreas: Unremarkable. No pancreatic ductal dilatation or surrounding inflammatory changes. Spleen: Normal in size without focal abnormality. Adrenals/Urinary Tract: The right adrenal gland is preserved. No abnormal calcification seen in the right kidney nor along the course of the right ureter. Previous resection of the left adrenal gland and left kidney is noted. No abnormal soft tissue in the surgical bed on this noncontrast exam. Preserved contours of the underdistended urinary bladder. Stomach/Bowel: Dilated stomach with air, fluid and debris. The duodenal is also dilated as is the proximal small bowel, jejunum. There is transition point in the left hemipelvis. No wall thickening or inflammatory changes. No pneumatosis. The distal small bowel and colon are decompressed. Scattered colonic stool. Vascular/Lymphatic: Aortic atherosclerosis. No enlarged abdominal or pelvic lymph nodes. Reproductive: Uterus and bilateral adnexa are unremarkable. Other: Patient is tilted in the scanner. There is motion throughout the examination limiting evaluation. Musculoskeletal: Moderate degenerative changes of the spine and pelvis. Trace anterolisthesis of L4 on L5. IMPRESSION: Dilated stomach, duodenal and proximal small bowel up to 4.5 cm with a transition point in the left hemipelvis. Developing or partial obstruction. No free air, pneumatosis or free fluid. Surgical changes from previous left adrenalectomy and nephrectomy. Please correlate with clinical history. Electronically Signed   By: Karen Kays M.D.   On: 04/09/2023 16:48    Anti-infectives: Anti-infectives (From admission, onward)    None       Assessment/Plan: SBO - multiple prior abdominal surgeries including LOA for SBO, most recent was open left adrenalectomy last year at [facility] - NG tube - continue small bowel protocol - follow-up film in AM - abdominal exam fairly benign this AM and  patient denies nausea and vomiting - no indication for emergent surgical intervention based on exam this AM.    FEN: NPO x ice chips; IVF per [facility] VTE: SQ[facility] ID: no current abx   - per [facility] -   HTN Hx of pheochromocytoma s/p open left adrenalectomy  AKI on CKD - pt is s/p left nephrectomy 1975  LOS: 2 days    Wynona Luna 04/11/2023

## 2023-04-11 NOTE — Progress Notes (Signed)
[facility]                                                                              Nicole Oconnor, is a 77 y.o. female, DOB - 1946-07-22, [id number] Admit date - 04/09/2023    Outpatient Primary MD for the patient is Erling Cruz, [facility]  LOS - 2  days  Chief Complaint  Patient presents with   Emesis       Brief summary   Patient is a 77 year old female with pheochromocytoma s/p left adrenalectomy in 10/2021, solitary right kidney, history of SBO s/p ex lap and lysis of additions in 05/2021, HTN presented with abdominal pain, nausea and vomiting for last 3 days.  Patient reported last BM on 8/13, since then persistent nausea and vomiting, unable to maintain p.o. intake. In ED, found to have SBO, attempt to place an NG tube in ED was unsuccessful.  Admitted for further workup.  Assessment & Plan    Principal Problem:   SBO (small bowel obstruction) (HCC) -Multiple prior abdominal surgeries, prior history of SBO with ex lap and lysis of adhesions in 05/2021 -NGT placed, with significant output -Continue n.p.o., IVF, ice chips, NGT to intermittent wall suction, Dulcolax suppository -General Surgery following, appreciate assistance  Active Problems:   Essential hypertension -hold oral antihypertensives (outpatient on olmesartan, HCTZ, atenolol) -Placed on IVF  Acute kidney injury -Creatinine 2.54 on admission, was 0.75 in 06/2022 -Likely prerenal due to poor p.o. intake, SBO -Improving, 1.3, continue IVF  Hypokalemia -K3.3, replaced IV  Obesity Estimated body mass index is 32.1 kg/m as calculated from the following:   Height as of this encounter: 5\' 3"  (1.6 m).   Weight as of this encounter: 82.2 kg.  Code Status: Full code DVT Prophylaxis:  heparin injection 5,000 Units Start: 04/09/23 2200   Level of Care: Level of care: Med-Surg Family Communication: Updated patient's daughter at the bedside Disposition Plan:      Remains inpatient  appropriate:    Procedures:  None  Consultants:   General Surgery  Antimicrobials:   Anti-infectives (From admission, onward)    None          Medications  bisacodyl  10 mg Rectal Daily   heparin  5,000 Units Subcutaneous Q8H      Subjective:   Nicole Oconnor was seen and examined today.  Still no BM or flatus, NGT with significant output.  Eating ice chips.  No active vomiting, abdominal pain, chest pain or shortness of breath.  Daughter at the bedside.  Objective:   Vitals:   04/10/23 1512 04/10/23 2207 04/11/23 0500 04/11/23 0507  BP: (!) 92/55 99/60  103/64  Pulse: 93 69  92  Resp: 18 17  16   Temp: 99.6 F (37.6 C) 98.5 F (36.9 C)  98.9 F (37.2 C)  TempSrc: Oral Oral  Oral  SpO2: 92% 91%  91%  Weight:   82.2 kg   Height:        Intake/Output Summary (Last 24 hours) at 04/11/2023 1308 Last data filed at 04/11/2023 1249 Gross per 24 hour  Intake 392.19 ml  Output 4150 ml  Net -3757.81  ml     Wt Readings from Last 3 Encounters:  04/11/23 82.2 kg  06/14/20 79.4 kg  12/30/18 111.1 kg    Physical Exam General: Alert and oriented x 3, NAD, NGT+ Cardiovascular: S1 S2 clear, RRR.  Respiratory: CTAB, no wheezing Gastrointestinal: Soft, NT, ND, hypoactive bowel sounds  Ext: no pedal edema bilaterally Neuro: no new deficits Psych: Normal affect    Data Reviewed:  I have personally reviewed following labs    CBC Lab Results  Component Value Date   WBC 15.1 (H) 04/11/2023   RBC 4.37 04/11/2023   HGB 12.1 04/11/2023   HCT 38.5 04/11/2023   MCV 88.1 04/11/2023   MCH 27.7 04/11/2023   PLT 166 04/11/2023   MCHC 31.4 04/11/2023   RDW 14.7 04/11/2023   LYMPHSABS 0.7 04/09/2023   MONOABS 0.8 04/09/2023   EOSABS 0.0 04/09/2023   BASOSABS 0.0 04/09/2023     Last metabolic panel Lab Results  Component Value Date   NA 142 04/11/2023   K 3.3 (L) 04/11/2023   CL 99 04/11/2023   CO2 29 04/11/2023   BUN 66 (H) 04/11/2023   CREATININE 1.30  (H) 04/11/2023   GLUCOSE 77 04/11/2023   GFRNONAA 42 (L) 04/11/2023   CALCIUM 9.2 04/11/2023   PROT 8.1 04/09/2023   ALBUMIN 4.5 04/09/2023   BILITOT 0.7 04/09/2023   ALKPHOS 89 04/09/2023   AST 22 04/09/2023   ALT 15 04/09/2023   ANIONGAP 14 04/11/2023    CBG (last 3)  No results for input(s): "GLUCAP" in the last 72 hours.    Coagulation Profile: No results for input(s): "INR", "PROTIME" in the last 168 hours.   Radiology Studies: I have personally reviewed the imaging studies  DG Abd Portable 1V-Small Bowel Obstruction Protocol-initial, 8 hr delay  Result Date: 04/11/2023 CLINICAL DATA:  Small-bowel obstruction, 8 hour delayed examination EXAM: PORTABLE ABDOMEN - 1 VIEW COMPARISON:  04/10/2023, CT 04/09/2023 FINDINGS: Administered barium contrast is seen within the expected gastric and duodenal lumen but does not appear to have progressed into the proximal small bowel. Nasogastric tube overlies the gastric lumen. Abdominal gas pattern is unchanged. No gross free intraperitoneal gas. IMPRESSION: 1. Persistent small bowel obstruction. Administered oral contrast opacifies the gastric and proximal duodenal lumen. Electronically Signed   By: Helyn Numbers M.D.   On: 04/11/2023 01:29   DG Abd Portable 1V-Small Bowel Protocol-Position Verification  Result Date: 04/10/2023 CLINICAL DATA:  [id number] Encounter for imaging study to confirm nasogastric (NG) tube placement [id number]. EXAM: PORTABLE ABDOMEN - 1 VIEW COMPARISON:  04/10/2023 at 0851 hours. FINDINGS: Enteric tube tip and side port project over the stomach. IMPRESSION: Enteric tube tip and side port project over the stomach. Electronically Signed   By: Orvan Falconer M.D.   On: 04/10/2023 14:55   DG Abd Portable 1V  Result Date: 04/10/2023 CLINICAL DATA:  SBO (small bowel obstruction) (HCC) EXAM: PORTABLE ABDOMEN - 1 VIEW COMPARISON:  CT abdomen/pelvis 04/09/2023. FINDINGS: Similar marked dilation of proximal bowel loops, compatible  with known obstruction better characterized on recent CT. No obvious evidence of free air on this limited supine radiograph. No abnormal calcifications. Left upper quadrant clips. Polyarticular degenerative change. IMPRESSION: Similar marked dilation of proximal bowel loops, compatible with known obstruction better characterized on recent CT. Electronically Signed   By: Feliberto Harts M.D.   On: 04/10/2023 09:08   CT ABDOMEN PELVIS WO CONTRAST  Result Date: 04/09/2023 CLINICAL DATA:  Emesis and abdominal pain for 2 days. EXAM: CT  ABDOMEN AND PELVIS WITHOUT CONTRAST TECHNIQUE: Multidetector CT imaging of the abdomen and pelvis was performed following the standard protocol without IV contrast. RADIATION DOSE REDUCTION: This exam was performed according to the departmental dose-optimization program which includes automated exposure control, adjustment of the mA and/or kV according to patient size and/or use of iterative reconstruction technique. COMPARISON:  None Available. FINDINGS: Lower chest: Slight linear opacity lung bases likely scar or atelectasis. No pleural effusion. Coronary artery calcifications are seen. Hepatobiliary: With the limits of non IV contrast, grossly the hepatic parenchyma is preserved. Gallbladder is nondilated. Pancreas: Unremarkable. No pancreatic ductal dilatation or surrounding inflammatory changes. Spleen: Normal in size without focal abnormality. Adrenals/Urinary Tract: The right adrenal gland is preserved. No abnormal calcification seen in the right kidney nor along the course of the right ureter. Previous resection of the left adrenal gland and left kidney is noted. No abnormal soft tissue in the surgical bed on this noncontrast exam. Preserved contours of the underdistended urinary bladder. Stomach/Bowel: Dilated stomach with air, fluid and debris. The duodenal is also dilated as is the proximal small bowel, jejunum. There is transition point in the left hemipelvis. No wall  thickening or inflammatory changes. No pneumatosis. The distal small bowel and colon are decompressed. Scattered colonic stool. Vascular/Lymphatic: Aortic atherosclerosis. No enlarged abdominal or pelvic lymph nodes. Reproductive: Uterus and bilateral adnexa are unremarkable. Other: Patient is tilted in the scanner. There is motion throughout the examination limiting evaluation. Musculoskeletal: Moderate degenerative changes of the spine and pelvis. Trace anterolisthesis of L4 on L5. IMPRESSION: Dilated stomach, duodenal and proximal small bowel up to 4.5 cm with a transition point in the left hemipelvis. Developing or partial obstruction. No free air, pneumatosis or free fluid. Surgical changes from previous left adrenalectomy and nephrectomy. Please correlate with clinical history. Electronically Signed   By: Karen Kays M.D.   On: 04/09/2023 16:48       Firmin Belisle M.D. [facility] 04/11/2023, 1:08 PM  Available via Epic secure chat 7am-7pm After 7 pm, please refer to night coverage provider listed on amion.

## 2023-04-11 NOTE — Plan of Care (Signed)
  Problem: Health Behavior/Discharge Planning: Goal: Ability to manage health-related needs will improve Outcome: Progressing   Problem: Clinical Measurements: Goal: Ability to maintain clinical measurements within normal limits will improve Outcome: Progressing   Problem: Coping: Goal: Level of anxiety will decrease Outcome: Progressing   Problem: Elimination: Goal: Will not experience complications related to bowel motility Outcome: Progressing   Problem: Pain Managment: Goal: General experience of comfort will improve Outcome: Progressing   Problem: Safety: Goal: Ability to remain free from injury will improve Outcome: Progressing   Problem: Skin Integrity: Goal: Risk for impaired skin integrity will decrease Outcome: Progressing   

## 2023-04-11 NOTE — Plan of Care (Signed)
  Problem: Coping: Goal: Level of anxiety will decrease Outcome: Progressing   Problem: Pain Managment: Goal: General experience of comfort will improve Outcome: Progressing   

## 2023-04-12 ENCOUNTER — Inpatient Hospital Stay (HOSPITAL_COMMUNITY): Payer: Medicare Other

## 2023-04-12 DIAGNOSIS — N179 Acute kidney failure, unspecified: Secondary | ICD-10-CM | POA: Diagnosis not present

## 2023-04-12 DIAGNOSIS — R112 Nausea with vomiting, unspecified: Secondary | ICD-10-CM | POA: Diagnosis not present

## 2023-04-12 DIAGNOSIS — K56609 Unspecified intestinal obstruction, unspecified as to partial versus complete obstruction: Secondary | ICD-10-CM | POA: Diagnosis not present

## 2023-04-12 LAB — CBC
HCT: 37.8 % (ref 36.0–46.0)
Hemoglobin: 11.8 g/dL — ABNORMAL LOW (ref 12.0–15.0)
MCH: 27.6 pg (ref 26.0–34.0)
MCHC: 31.2 g/dL (ref 30.0–36.0)
MCV: 88.3 fL (ref 80.0–100.0)
Platelets: 199 10*3/uL (ref 150–400)
RBC: 4.28 MIL/uL (ref 3.87–5.11)
RDW: 14.9 % (ref 11.5–15.5)
WBC: 14.1 10*3/uL — ABNORMAL HIGH (ref 4.0–10.5)
nRBC: 0 % (ref 0.0–0.2)

## 2023-04-12 LAB — BASIC METABOLIC PANEL
Anion gap: 18 — ABNORMAL HIGH (ref 5–15)
BUN: 65 mg/dL — ABNORMAL HIGH (ref 8–23)
CO2: 30 mmol/L (ref 22–32)
Calcium: 9.4 mg/dL (ref 8.9–10.3)
Chloride: 96 mmol/L — ABNORMAL LOW (ref 98–111)
Creatinine, Ser: 1.21 mg/dL — ABNORMAL HIGH (ref 0.44–1.00)
GFR, Estimated: 46 mL/min — ABNORMAL LOW (ref >60)
Glucose, Bld: 81 mg/dL (ref 70–99)
Potassium: 3.9 mmol/L (ref 3.5–5.1)
Sodium: 144 mmol/L (ref 135–145)

## 2023-04-12 MED ORDER — METOCLOPRAMIDE HCL 5 MG/ML IJ SOLN
10.0000 mg | Freq: Four times a day (QID) | INTRAMUSCULAR | Status: DC
  Administered 2023-04-12 – 2023-04-14 (×8): 10 mg via INTRAVENOUS
  Filled 2023-04-12 (×8): qty 2

## 2023-04-12 NOTE — Plan of Care (Signed)
  Problem: Clinical Measurements: Goal: Ability to maintain clinical measurements within normal limits will improve Outcome: Progressing   Problem: Activity: Goal: Risk for activity intolerance will decrease Outcome: Progressing   Problem: Elimination: Goal: Will not experience complications related to bowel motility Outcome: Progressing

## 2023-04-12 NOTE — Progress Notes (Signed)
Subjective/Chief Complaint: Patient reports flatus several times, no BM Feels much less distended Plain films this morning - not yet read by Radiology, but it appears that she still has some mildly dilated SB, but contrast no longer visible in the SB.  Air in colon. NG output significantly decreased.  Objective: Vital signs in last 24 hours: Temp:  [98.2 F (36.8 C)-99.4 F (37.4 C)] 98.2 F (36.8 C) (08/18 0607) Pulse Rate:  [86-87] 87 (08/18 0607) Resp:  [16-18] 16 (08/18 0607) BP: (110-113)/(65-67) 110/65 (08/18 0607) SpO2:  [98 %-100 %] 98 % (08/18 0607) Last BM Date : 04/08/23  Intake/Output from previous day: 08/17 0701 - 08/18 0700 In: 840.3 [P.O.:400; I.V.:212.8; IV Piggyback:227.5] Out: 1650 [Emesis/NG output:1650] Intake/Output this shift: Total I/O In: 30 [P.O.:30] Out: 400 [Emesis/NG output:400]  WDWN in NAD NG functioning - some bilious drainage in tubing Abd - soft, non-distended, non-tender  Lab Results:  Recent Labs    04/11/23 0808 04/12/23 0331  WBC 15.1* 14.1*  HGB 12.1 11.8*  HCT 38.5 37.8  PLT 166 199   BMET Recent Labs    04/11/23 0808 04/12/23 0331  NA 142 144  K 3.3* 3.9  CL 99 96*  CO2 29 30  GLUCOSE 77 81  BUN 66* 65*  CREATININE 1.30* 1.21*  CALCIUM 9.2 9.4   PT/INR No results for input(s): "LABPROT", "INR" in the last 72 hours. ABG No results for input(s): "PHART", "HCO3" in the last 72 hours.  Invalid input(s): "PCO2", "PO2"  Studies/Results: DG Abd Portable 2V  Result Date: 04/12/2023 CLINICAL DATA:  Concern for small bowel obstruction. EXAM: PORTABLE ABDOMEN - 2 VIEW COMPARISON:  Abdominal radiograph dated 04/11/2023. FINDINGS: Multiple gas-filled dilated loops of bowel are seen in the mid abdomen, measuring up to 5.8 cm in diameter. The degree of gaseous distention appears slightly increased compared to 04/11/2023, but similar to 04/10/2023. Air-fluid levels are noted. An enteric tube terminates in the stomach. A  few locules of gas overlie the rectum. Degenerative changes are seen in the spine and hips. IMPRESSION: Multiple gas-filled dilated loops of bowel in the mid abdomen, slightly increased compared to 04/11/2023, but similar to 04/10/2023. These findings likely reflect small bowel obstruction. Electronically Signed   By: Romona Curls M.D.   On: 04/12/2023 10:04   DG Abd Portable 1V-Small Bowel Obstruction Protocol-initial, 8 hr delay  Result Date: 04/11/2023 CLINICAL DATA:  Small-bowel obstruction, 8 hour delayed examination EXAM: PORTABLE ABDOMEN - 1 VIEW COMPARISON:  04/10/2023, CT 04/09/2023 FINDINGS: Administered barium contrast is seen within the expected gastric and duodenal lumen but does not appear to have progressed into the proximal small bowel. Nasogastric tube overlies the gastric lumen. Abdominal gas pattern is unchanged. No gross free intraperitoneal gas. IMPRESSION: 1. Persistent small bowel obstruction. Administered oral contrast opacifies the gastric and proximal duodenal lumen. Electronically Signed   By: Helyn Numbers M.D.   On: 04/11/2023 01:29   DG Abd Portable 1V-Small Bowel Protocol-Position Verification  Result Date: 04/10/2023 CLINICAL DATA:  [id number] Encounter for imaging study to confirm nasogastric (NG) tube placement [id number]. EXAM: PORTABLE ABDOMEN - 1 VIEW COMPARISON:  04/10/2023 at 0851 hours. FINDINGS: Enteric tube tip and side port project over the stomach. IMPRESSION: Enteric tube tip and side port project over the stomach. Electronically Signed   By: Orvan Falconer M.D.   On: 04/10/2023 14:55    Anti-infectives: Anti-infectives (From admission, onward)    None       Assessment/Plan: SBO - multiple prior  abdominal surgeries including LOA for SBO, most recent was open left adrenalectomy last year at [facility] - NG tube - clamp and begin clear liquids;  replace to suction if any nausea or vomiting - abdominal exam fairly benign this AM and patient denies nausea and  vomiting - no indication for emergent surgical intervention based on exam this AM.    FEN: clears; IVF per [facility] VTE: SQH ID: no current abx   - per [facility] -   HTN Hx of pheochromocytoma s/p open left adrenalectomy  AKI on CKD - pt is s/p left nephrectomy 1975  LOS: 3 days    Wynona Luna 04/12/2023

## 2023-04-12 NOTE — Progress Notes (Signed)
Patient c/o nausea, abd pain and distention. Clamped NG tube this morning per order. Placed patient back on suction at 1400 d/t complaints/worsening symptoms. Rechecked 1 hour later, produced 250 ml in canister. Symptoms subsiding. Paged Dr. Carolynne Edouard (on call) awaiting to hear if any further orders. Will keep on suction until then.

## 2023-04-12 NOTE — Progress Notes (Signed)
[facility]                                                                              Nicole Oconnor, is a 77 y.o. female, DOB - 1945-10-10, QQV:[id number] Admit date - 04/09/2023    Outpatient Primary MD for the patient is Erling Cruz, [facility]  LOS - 3  days  Chief Complaint  Patient presents with   Emesis       Brief summary   Patient is a 77 year old female with pheochromocytoma s/p left adrenalectomy in 10/2021, solitary right kidney, history of SBO s/p ex lap and lysis of additions in 05/2021, HTN presented with abdominal pain, nausea and vomiting for last 3 days.  Patient reported last BM on 8/13, since then persistent nausea and vomiting, unable to maintain p.o. intake. In ED, found to have SBO, attempt to place an NG tube in ED was unsuccessful.  Admitted for further workup.  Assessment & Plan    Principal Problem:   SBO (small bowel obstruction) (HCC) -Multiple prior abdominal surgeries, prior history of SBO with ex lap and lysis of adhesions in 05/2021 -NGT placed, with significant output -Patient denies any nausea and vomiting, general surgery managing -NGT clamped today, replaced to suction if any nausea or vomiting.  Active Problems:   Essential hypertension -hold oral antihypertensives (outpatient on olmesartan, HCTZ, atenolol) -Placed on IVF  Acute kidney injury -Creatinine 2.54 on admission, was 0.75 in 06/2022 -Likely prerenal due to poor p.o. intake, SBO -Creatinine improving, 1.2, continue gentle hydration until tolerating diet  Hypokalemia -Replace as needed  Obesity Estimated body mass index is 32.1 kg/m as calculated from the following:   Height as of this encounter: 5\' 3"  (1.6 m).   Weight as of this encounter: 82.2 kg.  Code Status: Full code DVT Prophylaxis:  heparin injection 5,000 Units Start: 04/09/23 2200   Level of Care: Level of care: Med-Surg Family Communication: Updated patient's daughter on the  phone Disposition Plan:      Remains inpatient appropriate:    Procedures:  None  Consultants:   General Surgery  Antimicrobials:   Anti-infectives (From admission, onward)    None          Medications  bisacodyl  10 mg Rectal Daily   heparin  5,000 Units Subcutaneous Q8H   metoCLOPramide (REGLAN) injection  10 mg Intravenous Q6H      Subjective:   Nicole Oconnor was seen and examined today.  No BM yet, no acute nausea or vomiting. + Flatus.  No acute abdominal pain fevers or chills, chest pain or shortness of breath.  Objective:   Vitals:   04/11/23 0507 04/11/23 2214 04/12/23 0607 04/12/23 1354  BP: 103/64 113/67 110/65 112/71  Pulse: 92 86 87 93  Resp: 16 18 16 17   Temp: 98.9 F (37.2 C) 99.4 F (37.4 C) 98.2 F (36.8 C) 98.2 F (36.8 C)  TempSrc: Oral     SpO2: 91% 100% 98% 96%  Weight:      Height:        Intake/Output Summary (Last 24 hours) at 04/12/2023 1440 Last data filed at 04/12/2023 1100 Gross per  24 hour  Intake 848.58 ml  Output 1800 ml  Net -951.42 ml     Wt Readings from Last 3 Encounters:  04/11/23 82.2 kg  06/14/20 79.4 kg  12/30/18 111.1 kg   Physical Exam General: Alert and oriented x 3, NAD, NGT Cardiovascular: S1 S2 clear, RRR.  Respiratory: CTAB, no wheezing Gastrointestinal: Soft, NT, ND, hypoactive BS  Ext: no pedal edema bilaterally Neuro: no new deficits Skin: No rashes Psych: Normal affect    Data Reviewed:  I have personally reviewed following labs    CBC Lab Results  Component Value Date   WBC 14.1 (H) 04/12/2023   RBC 4.28 04/12/2023   HGB 11.8 (L) 04/12/2023   HCT 37.8 04/12/2023   MCV 88.3 04/12/2023   MCH 27.6 04/12/2023   PLT 199 04/12/2023   MCHC 31.2 04/12/2023   RDW 14.9 04/12/2023   LYMPHSABS 0.7 04/09/2023   MONOABS 0.8 04/09/2023   EOSABS 0.0 04/09/2023   BASOSABS 0.0 04/09/2023     Last metabolic panel Lab Results  Component Value Date   NA 144 04/12/2023   K 3.9 04/12/2023    CL 96 (L) 04/12/2023   CO2 30 04/12/2023   BUN 65 (H) 04/12/2023   CREATININE 1.21 (H) 04/12/2023   GLUCOSE 81 04/12/2023   GFRNONAA 46 (L) 04/12/2023   CALCIUM 9.4 04/12/2023   PROT 8.1 04/09/2023   ALBUMIN 4.5 04/09/2023   BILITOT 0.7 04/09/2023   ALKPHOS 89 04/09/2023   AST 22 04/09/2023   ALT 15 04/09/2023   ANIONGAP 18 (H) 04/12/2023    CBG (last 3)  No results for input(s): "GLUCAP" in the last 72 hours.    Coagulation Profile: No results for input(s): "INR", "PROTIME" in the last 168 hours.   Radiology Studies: I have personally reviewed the imaging studies  DG Abd Portable 2V  Result Date: 04/12/2023 CLINICAL DATA:  Concern for small bowel obstruction. EXAM: PORTABLE ABDOMEN - 2 VIEW COMPARISON:  Abdominal radiograph dated 04/11/2023. FINDINGS: Multiple gas-filled dilated loops of bowel are seen in the mid abdomen, measuring up to 5.8 cm in diameter. The degree of gaseous distention appears slightly increased compared to 04/11/2023, but similar to 04/10/2023. Air-fluid levels are noted. An enteric tube terminates in the stomach. A few locules of gas overlie the rectum. Degenerative changes are seen in the spine and hips. IMPRESSION: Multiple gas-filled dilated loops of bowel in the mid abdomen, slightly increased compared to 04/11/2023, but similar to 04/10/2023. These findings likely reflect small bowel obstruction. Electronically Signed   By: Romona Curls M.D.   On: 04/12/2023 10:04   DG Abd Portable 1V-Small Bowel Obstruction Protocol-initial, 8 hr delay  Result Date: 04/11/2023 CLINICAL DATA:  Small-bowel obstruction, 8 hour delayed examination EXAM: PORTABLE ABDOMEN - 1 VIEW COMPARISON:  04/10/2023, CT 04/09/2023 FINDINGS: Administered barium contrast is seen within the expected gastric and duodenal lumen but does not appear to have progressed into the proximal small bowel. Nasogastric tube overlies the gastric lumen. Abdominal gas pattern is unchanged. No gross free  intraperitoneal gas. IMPRESSION: 1. Persistent small bowel obstruction. Administered oral contrast opacifies the gastric and proximal duodenal lumen. Electronically Signed   By: Helyn Numbers M.D.   On: 04/11/2023 01:29       Frederico Gerling M.D. [facility] 04/12/2023, 2:40 PM  Available via Epic secure chat 7am-7pm After 7 pm, please refer to night coverage provider listed on amion.

## 2023-04-12 NOTE — Progress Notes (Signed)
NG tube clamped per order. Told patient to notify me if any nausea or vomiting occurs.

## 2023-04-13 ENCOUNTER — Other Ambulatory Visit: Payer: Self-pay

## 2023-04-13 ENCOUNTER — Inpatient Hospital Stay (HOSPITAL_COMMUNITY): Payer: Medicare Other

## 2023-04-13 DIAGNOSIS — R112 Nausea with vomiting, unspecified: Secondary | ICD-10-CM | POA: Diagnosis not present

## 2023-04-13 DIAGNOSIS — N179 Acute kidney failure, unspecified: Secondary | ICD-10-CM | POA: Diagnosis not present

## 2023-04-13 DIAGNOSIS — K56609 Unspecified intestinal obstruction, unspecified as to partial versus complete obstruction: Secondary | ICD-10-CM | POA: Diagnosis not present

## 2023-04-13 LAB — MAGNESIUM: Magnesium: 3.4 mg/dL — ABNORMAL HIGH (ref 1.7–2.4)

## 2023-04-13 LAB — CBC
HCT: 39.6 % (ref 36.0–46.0)
Hemoglobin: 12.2 g/dL (ref 12.0–15.0)
MCH: 27.4 pg (ref 26.0–34.0)
MCHC: 30.8 g/dL (ref 30.0–36.0)
MCV: 88.8 fL (ref 80.0–100.0)
Platelets: 220 10*3/uL (ref 150–400)
RBC: 4.46 MIL/uL (ref 3.87–5.11)
RDW: 14.7 % (ref 11.5–15.5)
WBC: 13.6 10*3/uL — ABNORMAL HIGH (ref 4.0–10.5)
nRBC: 0.1 % (ref 0.0–0.2)

## 2023-04-13 LAB — BASIC METABOLIC PANEL
Anion gap: >20 — ABNORMAL HIGH (ref 5–15)
BUN: 80 mg/dL — ABNORMAL HIGH (ref 8–23)
CO2: 32 mmol/L (ref 22–32)
Calcium: 9.6 mg/dL (ref 8.9–10.3)
Chloride: 92 mmol/L — ABNORMAL LOW (ref 98–111)
Creatinine, Ser: 1.66 mg/dL — ABNORMAL HIGH (ref 0.44–1.00)
GFR, Estimated: 31 mL/min — ABNORMAL LOW (ref >60)
Glucose, Bld: 108 mg/dL — ABNORMAL HIGH (ref 70–99)
Potassium: 3.2 mmol/L — ABNORMAL LOW (ref 3.5–5.1)
Sodium: 146 mmol/L — ABNORMAL HIGH (ref 135–145)

## 2023-04-13 LAB — GLUCOSE, CAPILLARY
Glucose-Capillary: 149 mg/dL — ABNORMAL HIGH (ref 70–99)
Glucose-Capillary: 184 mg/dL — ABNORMAL HIGH (ref 70–99)

## 2023-04-13 LAB — ALBUMIN: Albumin: 3.7 g/dL (ref 3.5–5.0)

## 2023-04-13 LAB — PHOSPHORUS: Phosphorus: 4.3 mg/dL (ref 2.5–4.6)

## 2023-04-13 MED ORDER — SODIUM CHLORIDE 0.9% FLUSH
10.0000 mL | Freq: Two times a day (BID) | INTRAVENOUS | Status: DC
  Administered 2023-04-14 – 2023-04-23 (×7): 10 mL

## 2023-04-13 MED ORDER — SODIUM CHLORIDE 0.9% FLUSH: 10.0000 mL | INTRAVENOUS | Status: DC | PRN

## 2023-04-13 MED ORDER — KCL IN DEXTROSE-NACL 20-5-0.45 MEQ/L-%-% IV SOLN
INTRAVENOUS | Status: DC
  Filled 2023-04-13: qty 1000

## 2023-04-13 MED ORDER — POTASSIUM CHLORIDE 10 MEQ/100ML IV SOLN
10.0000 meq | INTRAVENOUS | Status: AC
  Administered 2023-04-13 (×3): 10 meq via INTRAVENOUS
  Filled 2023-04-13 (×3): qty 100

## 2023-04-13 MED ORDER — SODIUM CHLORIDE 0.45 % IV SOLN: INTRAVENOUS | Status: AC

## 2023-04-13 MED ORDER — INSULIN ASPART 100 UNIT/ML IJ SOLN
0.0000 [IU] | Freq: Four times a day (QID) | INTRAMUSCULAR | Status: DC
  Administered 2023-04-13: 1 [IU] via SUBCUTANEOUS
  Administered 2023-04-14 (×2): 2 [IU] via SUBCUTANEOUS
  Administered 2023-04-15: 1 [IU] via SUBCUTANEOUS
  Administered 2023-04-15 (×2): 2 [IU] via SUBCUTANEOUS
  Administered 2023-04-16 – 2023-04-19 (×4): 1 [IU] via SUBCUTANEOUS

## 2023-04-13 MED ORDER — CHLORHEXIDINE GLUCONATE CLOTH 2 % EX PADS
6.0000 | MEDICATED_PAD | Freq: Every day | CUTANEOUS | Status: DC
  Administered 2023-04-13 – 2023-04-14 (×2): 6 via TOPICAL

## 2023-04-13 MED ORDER — TRAVASOL 10 % IV SOLN
INTRAVENOUS | Status: AC
  Filled 2023-04-13: qty 432

## 2023-04-13 MED ORDER — THIAMINE HCL 100 MG/ML IJ SOLN
100.0000 mg | Freq: Once | INTRAMUSCULAR | Status: AC
  Administered 2023-04-13: 100 mg via INTRAVENOUS
  Filled 2023-04-13: qty 2

## 2023-04-13 MED ORDER — THIAMINE HCL 100 MG/ML IJ SOLN
100.0000 mg | INTRAMUSCULAR | Status: AC
  Administered 2023-04-15 – 2023-04-18 (×4): 100 mg via INTRAVENOUS
  Filled 2023-04-13 (×4): qty 2

## 2023-04-13 MED ORDER — KCL IN DEXTROSE-NACL 20-5-0.45 MEQ/L-%-% IV SOLN
INTRAVENOUS | Status: AC
  Filled 2023-04-13: qty 1000

## 2023-04-13 NOTE — Progress Notes (Signed)
PHARMACY - TOTAL PARENTERAL NUTRITION CONSULT NOTE   Indication: Prolonged ileus  Patient Measurements: Height: 5\' 3"  (160 cm) Weight: 80 kg (176 lb 5.9 oz) IBW/kg (Calculated) : 52.4 TPN AdjBW (KG): 60.3 Body mass index is 31.24 kg/m.   Assessment:  Patient is a 77 year old female with pheochromocytoma s/p left adrenalectomy in 10/2021, solitary right kidney, history of SBO s/p ex lap and lysis of additions in 05/2021, HTN who  presented with abdominal pain, nausea and vomiting.  Patient reported last BM on 8/13, since then persistent nausea and vomiting, unable to maintain p.o. intake.  Admitted for SBO, which is not responding to conservative treatment.  Surgery recommending exploratory laparotomy with lysis of adhesions.  Pharmacy consulted for TPN.   Glucose / Insulin:  8/19 Glucose = 108 Electrolytes:  Potassium low; Chloride low. NA slightly elevated; Magnesium is elevated;  Phos=4.3, on upper side of normal limit; Corrected Calcium= 9.84, wnl Renal:  8/19 BUN= 80, elevated 8/19 Scr = 1.66 Hepatic:  Intake / Output; MIVF:  - MIVF: D5-1/2NS Kcl 55meqK/L at 149ml/hr - NG LIWS - 8/18 7 am to 8/19 7am: UOP:  NG output: GI Imaging: GI Surgeries / Procedures:   Central access:  Pending placement  TPN start date:   8/19 TPN to start tonight at 43ml/hr.  Provides 43g protein, 827 kcal/day.   Nutritional Goals: Goal still to be determined by Dietician.   RD Assessment:    Current Nutrition:  NPO  Plan:  - KCl IV every 1 hour x 3 doses ordered by MD  - Start TPN at 40 mL/hr at 1800 Electrolytes in TPN:  Na 15mEq/L K 48mEq/L Ca 106mEq/L  Mg 0 mEq/L Phos 0 mol/L Cl:Ac 2:1  - Add standard MVI and trace elements to TPN - Leave out Magnesium due to elevated level;   - Leave out Phos for 1 day due to level and reduced renal function.    - Lowered Potassium from standard amount due to reduced renal function. - Thiamine 100mg  IV x 1, then 100mg   IV daily for 5 more days - Initiate Sensitive q6h SSI and adjust as needed  - Change and reduce MIVF to 1/2NS at 1ml/hr at 1800 per MD order - CMP, Mag level, Phos level, & Triglycerides tomorrow AM - Monitor TPN labs on Mon/Thurs  Nicole Oconnor 04/13/2023,11:05 AM

## 2023-04-13 NOTE — Progress Notes (Signed)
Peripherally Inserted Central Catheter Placement  The IV Nurse has discussed with the patient and/or persons authorized to consent for the patient, the purpose of this procedure and the potential benefits and risks involved with this procedure.  The benefits include less needle sticks, lab draws from the catheter, and the patient may be discharged home with the catheter. Risks include, but not limited to, infection, bleeding, blood clot (thrombus formation), and puncture of an artery; nerve damage and irregular heartbeat and possibility to perform a PICC exchange if needed/ordered by physician.  Alternatives to this procedure were also discussed.  Bard Power PICC patient education guide, fact sheet on infection prevention and patient information card has been provided to patient /or left at bedside.    PICC Placement Documentation  PICC Double Lumen 04/13/23 Right Brachial 36 cm 0 cm (Active)  Indication for Insertion or Continuance of Line Administration of hyperosmolar/irritating solutions (i.e. TPN, Vancomycin, etc.) 04/13/23 1700  Exposed Catheter (cm) 0 cm 04/13/23 1700  Site Assessment Clean, Dry, Intact 04/13/23 1700  Lumen #1 Status Flushed;Saline locked;Blood return noted 04/13/23 1700  Lumen #2 Status Flushed;Saline locked;Blood return noted 04/13/23 1700  Dressing Type Transparent;Securing device 04/13/23 1700  Dressing Status Antimicrobial disc in place;Clean, Dry, Intact 04/13/23 1700  Line Care Connections checked and tightened 04/13/23 1700  Line Adjustment (NICU/IV Team Only) No 04/13/23 1700  Dressing Intervention New dressing 04/13/23 1700  Dressing Change Due 04/20/23 04/13/23 1700       Timmothy Sours 04/13/2023, 5:50 PM

## 2023-04-13 NOTE — Progress Notes (Signed)
   Subjective/Chief Complaint: Minimal flatus Only tolerated clamping for about four hours, before becoming distended and nauseated Does not feel distended NG output increased   Objective: Vital signs in last 24 hours: Temp:  [98.2 F (36.8 C)] 98.2 F (36.8 C) (08/19 0525) Pulse Rate:  [93-100] 100 (08/19 0525) Resp:  [16-17] 16 (08/19 0525) BP: (98-112)/(63-71) 98/63 (08/19 0525) SpO2:  [93 %-96 %] 93 % (08/19 0525) Weight:  [80 kg] 80 kg (08/19 0500) Last BM Date : 04/08/23  Intake/Output from previous day: 08/18 0701 - 08/19 0700 In: 300 [P.O.:300] Out: 4250 [Urine:1500; Emesis/NG output:2750] Intake/Output this shift: No intake/output data recorded.  WDWN in NAD NG functioning - some bilious drainage in tubing Abd - soft, mildly distended; non-tender  Lab Results:  Recent Labs    04/12/23 0331 04/13/23 0405  WBC 14.1* 13.6*  HGB 11.8* 12.2  HCT 37.8 39.6  PLT 199 220   BMET Recent Labs    04/12/23 0331 04/13/23 0405  NA 144 146*  K 3.9 3.2*  CL 96* 92*  CO2 30 32  GLUCOSE 81 108*  BUN 65* 80*  CREATININE 1.21* 1.66*  CALCIUM 9.4 9.6      Assessment/Plan: SBO - multiple prior abdominal surgeries including LOA for SBO, most recent was open left adrenalectomy last year at [facility] - NG tube - LIWS - At this point, she seems to have a persistent SBO not responding to conservative treatment.  Recommend exploratory laparotomy with lysis of adhesions.  Due to her surgical history, the patient is reluctant to undergo more abdominal surgery, as this is a recurrent problem.  She wants to wait at least one more day. - Continue Reglan/ repeat plain films    FEN: PICC/ TPN; IVF per [facility] VTE: SQH ID: no current abx   - per [facility] -   HTN Hx of pheochromocytoma s/p open left adrenalectomy  AKI on CKD - pt is s/p left nephrectomy 1975  LOS: 4 days    Wynona Luna 04/13/2023

## 2023-04-13 NOTE — Progress Notes (Signed)
[facility]                                                                              Nicole Oconnor, is a 77 y.o. female, DOB - 02/28/1946, ZHY:[id number] Admit date - 04/09/2023    Outpatient Primary MD for the patient is Erling Cruz, [facility]  LOS - 4  days  Chief Complaint  Patient presents with   Emesis       Brief summary   Patient is a 77 year old female with pheochromocytoma s/p left adrenalectomy in 10/2021, solitary right kidney, history of SBO s/p ex lap and lysis of additions in 05/2021, HTN presented with abdominal pain, nausea and vomiting for last 3 days.  Patient reported last BM on 8/13, since then persistent nausea and vomiting, unable to maintain p.o. intake. In ED, found to have SBO, attempt to place an NG tube in ED was unsuccessful.  Admitted for further workup.  Assessment & Plan    Principal Problem:   SBO (small bowel obstruction) (HCC) -Multiple prior abdominal surgeries, prior history of SBO with ex lap and lysis of adhesions in 05/2021 -NGT was clamped on 8/18 however did not tolerate, became nauseated and distended -NGT placed back to intermittent wall suction - on IV fluids,Placed surgery planning to start TPN today   active Problems:   Essential hypertension -hold oral antihypertensives (outpatient on olmesartan, HCTZ, atenolol) -Placed on IVF  Acute kidney injury -Creatinine 2.54 on admission, was 0.75 in 06/2022 -Likely prerenal due to poor p.o. intake, SBO -Creatinine trending up, 1.66, started on IV fluid hydration, plan for starting TPN this evening  Hypernatremia -Likely due to dehydration and water deficit -Started on IV fluid hydration, plan for TPN this evening  Hypokalemia -Replaced   Obesity Estimated body mass index is 31.24 kg/m as calculated from the following:   Height as of this encounter: 5\' 3"  (1.6 m).   Weight as of this encounter: 80 kg.  Code Status: Full code DVT Prophylaxis:  heparin  injection 5,000 Units Start: 04/09/23 2200   Level of Care: Level of care: Med-Surg Family Communication: Updated patient Disposition Plan:      Remains inpatient appropriate:    Procedures:  None  Consultants:   General Surgery  Antimicrobials:   Anti-infectives (From admission, onward)    None          Medications  bisacodyl  10 mg Rectal Daily   heparin  5,000 Units Subcutaneous Q8H   insulin aspart  0-9 Units Subcutaneous Q6H   metoCLOPramide (REGLAN) injection  10 mg Intravenous Q6H   [START ON 04/14/2023] thiamine (VITAMIN B1) injection  100 mg Intravenous Q24H      Subjective:   Nicole Oconnor was seen and examined today.  Still on NGT to LIWS, did not tolerate NG clamping yesterday.  No acute vomiting, abdominal pain.  No chest pain, shortness of breath, no BM or flatus  Objective:   Vitals:   04/12/23 1354 04/12/23 2133 04/13/23 0500 04/13/23 0525  BP: 112/71 103/65  98/63  Pulse: 93 99  100  Resp: 17 17  16   Temp: 98.2 F (36.8 C) 98.2 F (  36.8 C)  98.2 F (36.8 C)  TempSrc:  Oral  Oral  SpO2: 96% 93%  93%  Weight:   80 kg   Height:        Intake/Output Summary (Last 24 hours) at 04/13/2023 1310 Last data filed at 04/13/2023 1610 Gross per 24 hour  Intake 210 ml  Output 3600 ml  Net -3390 ml     Wt Readings from Last 3 Encounters:  04/13/23 80 kg  06/14/20 79.4 kg  12/30/18 111.1 kg   Physical Exam General: Alert and oriented x 3, NAD Cardiovascular: S1 S2 clear, RRR.  Respiratory: CTAB, no wheezing, rales or rhonchi Gastrointestinal: Soft, minimally tender, distended, hypoactive bowel sounds Ext: no pedal edema bilaterally Neuro: no new deficits Psych: Normal affect    Data Reviewed:  I have personally reviewed following labs    CBC Lab Results  Component Value Date   WBC 13.6 (H) 04/13/2023   RBC 4.46 04/13/2023   HGB 12.2 04/13/2023   HCT 39.6 04/13/2023   MCV 88.8 04/13/2023   MCH 27.4 04/13/2023   PLT 220  04/13/2023   MCHC 30.8 04/13/2023   RDW 14.7 04/13/2023   LYMPHSABS 0.7 04/09/2023   MONOABS 0.8 04/09/2023   EOSABS 0.0 04/09/2023   BASOSABS 0.0 04/09/2023     Last metabolic panel Lab Results  Component Value Date   NA 146 (H) 04/13/2023   K 3.2 (L) 04/13/2023   CL 92 (L) 04/13/2023   CO2 32 04/13/2023   BUN 80 (H) 04/13/2023   CREATININE 1.66 (H) 04/13/2023   GLUCOSE 108 (H) 04/13/2023   GFRNONAA 31 (L) 04/13/2023   CALCIUM 9.6 04/13/2023   PHOS 4.3 04/13/2023   PROT 8.1 04/09/2023   ALBUMIN 3.7 04/13/2023   BILITOT 0.7 04/09/2023   ALKPHOS 89 04/09/2023   AST 22 04/09/2023   ALT 15 04/09/2023   ANIONGAP >20 (H) 04/13/2023    CBG (last 3)  No results for input(s): "GLUCAP" in the last 72 hours.    Coagulation Profile: No results for input(s): "INR", "PROTIME" in the last 168 hours.   Radiology Studies: I have personally reviewed the imaging studies  [place] EKG SITE RITE  Result Date: 04/13/2023 If Site Rite image not attached, placement could not be confirmed due to current cardiac rhythm.  DG Abd Portable 2V  Result Date: 04/12/2023 CLINICAL DATA:  Concern for small bowel obstruction. EXAM: PORTABLE ABDOMEN - 2 VIEW COMPARISON:  Abdominal radiograph dated 04/11/2023. FINDINGS: Multiple gas-filled dilated loops of bowel are seen in the mid abdomen, measuring up to 5.8 cm in diameter. The degree of gaseous distention appears slightly increased compared to 04/11/2023, but similar to 04/10/2023. Air-fluid levels are noted. An enteric tube terminates in the stomach. A few locules of gas overlie the rectum. Degenerative changes are seen in the spine and hips. IMPRESSION: Multiple gas-filled dilated loops of bowel in the mid abdomen, slightly increased compared to 04/11/2023, but similar to 04/10/2023. These findings likely reflect small bowel obstruction. Electronically Signed   By: Romona Curls M.D.   On: 04/12/2023 10:04       Micayla Brathwaite M.D. [facility] 04/13/2023, 1:10 PM  Available via Epic secure chat 7am-7pm After 7 pm, please refer to night coverage provider listed on amion.

## 2023-04-13 NOTE — Plan of Care (Signed)
  Problem: Activity: Goal: Risk for activity intolerance will decrease Outcome: Progressing   Problem: Pain Managment: Goal: General experience of comfort will improve Outcome: Progressing   Problem: Safety: Goal: Ability to remain free from injury will improve Outcome: Progressing   

## 2023-04-13 NOTE — Progress Notes (Signed)
Initial Nutrition Assessment  DOCUMENTATION CODES:   Severe malnutrition in context of acute illness/injury  INTERVENTION:  - TPN to start tonight at 62mL/hr per pharmacy. Provides 43g protein, 125g dextrose, and 230 [place] kcals = 827 kcal/day - TPN management per pharmacy.  - Monitor magnesium, potassium, and phosphorus BID for at least 3 days, MD to replete as needed, as pt is at risk for refeeding syndrome given minimal intake for ~1 week and moderate malnutrition. - 100mg  thiamine x5 days in TPN.   - Monitor weight trends.   - Will monitor for diet advancement.  NUTRITION DIAGNOSIS:   Severe Malnutrition related to acute illness (SBO) as evidenced by moderate muscle depletion, energy intake < or equal to 50% for > or equal to 5 days.  GOAL:   Patient will meet greater than or equal to 90% of their needs  MONITOR:   Diet advancement, Labs, Weight trends, I & O's  REASON FOR ASSESSMENT:   Consult New TPN/TNA  ASSESSMENT:   77 y.o. 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 Admitted for SBO.  Patient reports a UBW of around 181# and no changes in weight over the past year until the last week when she wasn't eating well due to N/V.  Per EMR, patient has no weight history since 2021 to assess changes in weight over the past year.   She endorses typically eating 2 meals a day, breakfast and dinner. Eating normally with good appetite until the N/V and abdominal pain started last Tuesday (8/13). Since then she hasn't been able to keep hardly anything down.   Patient had NGT placed on admission which has been to LIS. NGT was clamped and diet advanced to clear liquids yesterday but patient developed significant abdominal distension so NGT placed back to LIS where it has remained.  Per  Surgery's note today, plan to start TPN.   Discussed patient with pharmacist. Concerned for refeeding syndrome due to ~1 week minimal nutrition. Plan to start at 41mL/hr and add 100mg  thiamine x5 days.   Medications reviewed and include: Dulcolax, Reglan, D5 at (provides 408 kcals over 24 hours - ends at 1759)  Labs reviewed:  Na 146 K+ 3.2 Creatinine 1.66 Magnesium 3.4   NUTRITION - FOCUSED PHYSICAL EXAM:  Flowsheet Row Most Recent Value  Orbital Region Moderate depletion  Upper Arm Region No depletion  Thoracic and Lumbar Region No depletion  Buccal Region No depletion  Temple Region Moderate depletion  Clavicle Bone Region Moderate depletion  Clavicle and Acromion Bone Region Mild depletion  Scapular Bone Region Unable to assess  Dorsal Hand Mild depletion  Patellar Region No depletion  Anterior Thigh Region No depletion  Posterior Calf Region No depletion  Edema (RD Assessment) Mild  Hair Reviewed  Eyes Reviewed  Mouth Reviewed  Skin Reviewed  Nails Reviewed       Diet Order:   Diet Order             Diet NPO time specified Except for: Sips with Meds, Ice Chips  Diet effective now                   EDUCATION NEEDS:  Education needs have been addressed  Skin:  Skin Assessment: Reviewed RN Assessment  Last BM:  8/14  Height:  Ht Readings from Last 1 Encounters:  04/09/23 5\' 3"  (1.6 m)   Weight:  Wt Readings from Last 1 Encounters:  04/13/23 80 kg    BMI:  Body mass index is 31.24 kg/m.  Estimated Nutritional Needs:  Kcal:  1800-2000 kcals Protein:  90-110 grams Fluid:  >/= 1.8L    Shelle Iron RD, LDN For contact information, refer to [facility].

## 2023-04-14 ENCOUNTER — Other Ambulatory Visit: Payer: Self-pay

## 2023-04-14 ENCOUNTER — Encounter (HOSPITAL_COMMUNITY)
Admission: EM | Disposition: A | Discharge status: Home Health | Payer: Self-pay | Source: Home / Self Care | Attending: Internal Medicine

## 2023-04-14 ENCOUNTER — Inpatient Hospital Stay (HOSPITAL_COMMUNITY): Payer: Medicare Other | Admitting: Anesthesiology

## 2023-04-14 ENCOUNTER — Encounter (HOSPITAL_COMMUNITY): Payer: Self-pay | Admitting: Family Medicine

## 2023-04-14 DIAGNOSIS — R112 Nausea with vomiting, unspecified: Secondary | ICD-10-CM | POA: Diagnosis not present

## 2023-04-14 DIAGNOSIS — E43 Unspecified severe protein-calorie malnutrition: Secondary | ICD-10-CM | POA: Insufficient documentation

## 2023-04-14 DIAGNOSIS — I10 Essential (primary) hypertension: Secondary | ICD-10-CM

## 2023-04-14 DIAGNOSIS — K56609 Unspecified intestinal obstruction, unspecified as to partial versus complete obstruction: Secondary | ICD-10-CM

## 2023-04-14 DIAGNOSIS — N179 Acute kidney failure, unspecified: Secondary | ICD-10-CM | POA: Diagnosis not present

## 2023-04-14 HISTORY — PX: LAPAROTOMY: SHX154

## 2023-04-14 LAB — COMPREHENSIVE METABOLIC PANEL
ALT: 11 U/L (ref 0–44)
AST: 26 U/L (ref 15–41)
Albumin: 3.3 g/dL — ABNORMAL LOW (ref 3.5–5.0)
Alkaline Phosphatase: 80 U/L (ref 38–126)
Anion gap: 11 (ref 5–15)
BUN: 78 mg/dL — ABNORMAL HIGH (ref 8–23)
CO2: 38 mmol/L — ABNORMAL HIGH (ref 22–32)
Calcium: 9.1 mg/dL (ref 8.9–10.3)
Chloride: 93 mmol/L — ABNORMAL LOW (ref 98–111)
Creatinine, Ser: 1.32 mg/dL — ABNORMAL HIGH (ref 0.44–1.00)
GFR, Estimated: 41 mL/min — ABNORMAL LOW (ref >60)
Glucose, Bld: 183 mg/dL — ABNORMAL HIGH (ref 70–99)
Potassium: 3.8 mmol/L (ref 3.5–5.1)
Sodium: 142 mmol/L (ref 135–145)
Total Bilirubin: 1.5 mg/dL — ABNORMAL HIGH (ref 0.3–1.2)
Total Protein: 7.3 g/dL (ref 6.5–8.1)

## 2023-04-14 LAB — TYPE AND SCREEN
ABO/RH(D): O POS
Antibody Screen: NEGATIVE

## 2023-04-14 LAB — GLUCOSE, CAPILLARY
Glucose-Capillary: 168 mg/dL — ABNORMAL HIGH (ref 70–99)
Glucose-Capillary: 168 mg/dL — ABNORMAL HIGH (ref 70–99)
Glucose-Capillary: 180 mg/dL — ABNORMAL HIGH (ref 70–99)

## 2023-04-14 LAB — MAGNESIUM: Magnesium: 3.1 mg/dL — ABNORMAL HIGH (ref 1.7–2.4)

## 2023-04-14 LAB — PHOSPHORUS: Phosphorus: 2.5 mg/dL (ref 2.5–4.6)

## 2023-04-14 LAB — CBC
HCT: 38.4 % (ref 36.0–46.0)
Hemoglobin: 12 g/dL (ref 12.0–15.0)
MCH: 27.6 pg (ref 26.0–34.0)
MCHC: 31.3 g/dL (ref 30.0–36.0)
MCV: 88.3 fL (ref 80.0–100.0)
Platelets: 213 10*3/uL (ref 150–400)
RBC: 4.35 MIL/uL (ref 3.87–5.11)
RDW: 14.4 % (ref 11.5–15.5)
WBC: 16 10*3/uL — ABNORMAL HIGH (ref 4.0–10.5)
nRBC: 0.2 % (ref 0.0–0.2)

## 2023-04-14 LAB — ABO/RH: ABO/RH(D): O POS

## 2023-04-14 LAB — TRIGLYCERIDES: Triglycerides: 79 mg/dL (ref <150)

## 2023-04-14 LAB — PROCALCITONIN: Procalcitonin: 0.61 ng/mL

## 2023-04-14 SURGERY — LAPAROTOMY, EXPLORATORY
Anesthesia: General

## 2023-04-14 MED ORDER — ALBUMIN HUMAN 5 % IV SOLN: 12.5000 g | Freq: Once | INTRAVENOUS | Status: AC

## 2023-04-14 MED ORDER — ACETAMINOPHEN 10 MG/ML IV SOLN
INTRAVENOUS | Status: AC
  Filled 2023-04-14: qty 100

## 2023-04-14 MED ORDER — SODIUM CHLORIDE 0.9 % IV SOLN
2.0000 g | INTRAVENOUS | Status: AC
  Administered 2023-04-14: 2 g via INTRAVENOUS
  Filled 2023-04-14: qty 2

## 2023-04-14 MED ORDER — EPHEDRINE 5 MG/ML INJ
INTRAVENOUS | Status: AC
  Filled 2023-04-14: qty 5

## 2023-04-14 MED ORDER — ROCURONIUM BROMIDE 10 MG/ML (PF) SYRINGE
PREFILLED_SYRINGE | INTRAVENOUS | Status: DC | PRN
  Administered 2023-04-14: 20 mg via INTRAVENOUS
  Administered 2023-04-14: 50 mg via INTRAVENOUS

## 2023-04-14 MED ORDER — CHLORHEXIDINE GLUCONATE 0.12 % MT SOLN: 15.0000 mL | Freq: Once | OROMUCOSAL | Status: DC

## 2023-04-14 MED ORDER — METHOCARBAMOL 1000 MG/10ML IJ SOLN
500.0000 mg | Freq: Three times a day (TID) | INTRAVENOUS | Status: DC
  Administered 2023-04-14 – 2023-04-23 (×27): 500 mg via INTRAVENOUS
  Filled 2023-04-14 (×13): qty 500
  Filled 2023-04-14: qty 5
  Filled 2023-04-14 (×7): qty 500
  Filled 2023-04-14: qty 5
  Filled 2023-04-14 (×7): qty 500

## 2023-04-14 MED ORDER — PROPOFOL 10 MG/ML IV BOLUS
INTRAVENOUS | Status: DC | PRN
  Administered 2023-04-14: 150 mg via INTRAVENOUS

## 2023-04-14 MED ORDER — EPHEDRINE SULFATE-NACL 50-0.9 MG/10ML-% IV SOSY
PREFILLED_SYRINGE | INTRAVENOUS | Status: DC | PRN
  Administered 2023-04-14: 10 mg via INTRAVENOUS
  Administered 2023-04-14: 5 mg via INTRAVENOUS

## 2023-04-14 MED ORDER — LACTATED RINGERS IV SOLN: INTRAVENOUS | Status: DC

## 2023-04-14 MED ORDER — HEPARIN SODIUM (PORCINE) 5000 UNIT/ML IJ SOLN
5000.0000 [IU] | Freq: Three times a day (TID) | INTRAMUSCULAR | Status: DC
  Administered 2023-04-15 – 2023-04-23 (×25): 5000 [IU] via SUBCUTANEOUS
  Filled 2023-04-14 (×25): qty 1

## 2023-04-14 MED ORDER — CHLORHEXIDINE GLUCONATE CLOTH 2 % EX PADS
6.0000 | MEDICATED_PAD | Freq: Every day | CUTANEOUS | Status: DC
  Administered 2023-04-15 – 2023-04-23 (×10): 6 via TOPICAL

## 2023-04-14 MED ORDER — FENTANYL CITRATE PF 50 MCG/ML IJ SOSY
25.0000 ug | PREFILLED_SYRINGE | INTRAMUSCULAR | Status: DC | PRN
  Administered 2023-04-14: 50 ug via INTRAVENOUS

## 2023-04-14 MED ORDER — LIDOCAINE 2% (20 MG/ML) 5 ML SYRINGE
INTRAMUSCULAR | Status: DC | PRN
  Administered 2023-04-14: 100 mg via INTRAVENOUS

## 2023-04-14 MED ORDER — ONDANSETRON HCL 4 MG/2ML IJ SOLN
INTRAMUSCULAR | Status: DC | PRN
  Administered 2023-04-14: 4 mg via INTRAVENOUS

## 2023-04-14 MED ORDER — 0.9 % SODIUM CHLORIDE (POUR BTL) OPTIME
TOPICAL | Status: DC | PRN
  Administered 2023-04-14: 2000 mL

## 2023-04-14 MED ORDER — BUPIVACAINE LIPOSOME 1.3 % IJ SUSP
INTRAMUSCULAR | Status: AC
  Filled 2023-04-14: qty 20

## 2023-04-14 MED ORDER — SUGAMMADEX SODIUM 200 MG/2ML IV SOLN
INTRAVENOUS | Status: DC | PRN
  Administered 2023-04-14: 200 mg via INTRAVENOUS

## 2023-04-14 MED ORDER — DROPERIDOL 2.5 MG/ML IJ SOLN: 0.6250 mg | Freq: Once | INTRAMUSCULAR | Status: DC | PRN

## 2023-04-14 MED ORDER — MORPHINE SULFATE (PF) 2 MG/ML IV SOLN: 1.0000 mg | INTRAVENOUS | Status: DC | PRN

## 2023-04-14 MED ORDER — ACETAMINOPHEN 10 MG/ML IV SOLN
INTRAVENOUS | Status: DC | PRN
  Administered 2023-04-14: 1000 mg via INTRAVENOUS

## 2023-04-14 MED ORDER — FENTANYL CITRATE (PF) 250 MCG/5ML IJ SOLN
INTRAMUSCULAR | Status: DC | PRN
  Administered 2023-04-14 (×2): 50 ug via INTRAVENOUS

## 2023-04-14 MED ORDER — POTASSIUM PHOSPHATES 15 MMOLE/5ML IV SOLN
12.0000 mmol | Freq: Once | INTRAVENOUS | Status: AC
  Administered 2023-04-14: 12 mmol via INTRAVENOUS
  Filled 2023-04-14: qty 4

## 2023-04-14 MED ORDER — ALBUMIN HUMAN 5 % IV SOLN
INTRAVENOUS | Status: AC
  Administered 2023-04-14: 12.5 g via INTRAVENOUS
  Filled 2023-04-14: qty 250

## 2023-04-14 MED ORDER — FENTANYL CITRATE (PF) 250 MCG/5ML IJ SOLN
INTRAMUSCULAR | Status: AC
  Filled 2023-04-14: qty 5

## 2023-04-14 MED ORDER — ALBUMIN HUMAN 5 % IV SOLN
INTRAVENOUS | Status: AC
  Filled 2023-04-14: qty 250

## 2023-04-14 MED ORDER — CHLORHEXIDINE GLUCONATE 0.12 % MT SOLN
15.0000 mL | Freq: Once | OROMUCOSAL | Status: AC
  Administered 2023-04-14: 15 mL via OROMUCOSAL

## 2023-04-14 MED ORDER — PHENYLEPHRINE HCL-NACL 20-0.9 MG/250ML-% IV SOLN
INTRAVENOUS | Status: DC | PRN
  Administered 2023-04-14: 100 ug/min via INTRAVENOUS

## 2023-04-14 MED ORDER — ROCURONIUM BROMIDE 10 MG/ML (PF) SYRINGE
PREFILLED_SYRINGE | INTRAVENOUS | Status: AC
  Filled 2023-04-14: qty 10

## 2023-04-14 MED ORDER — DEXAMETHASONE SODIUM PHOSPHATE 10 MG/ML IJ SOLN
INTRAMUSCULAR | Status: DC | PRN
  Administered 2023-04-14: 4 mg via INTRAVENOUS

## 2023-04-14 MED ORDER — FENTANYL CITRATE PF 50 MCG/ML IJ SOSY
PREFILLED_SYRINGE | INTRAMUSCULAR | Status: AC
  Administered 2023-04-14: 25 ug via INTRAVENOUS
  Filled 2023-04-14: qty 3

## 2023-04-14 MED ORDER — PHENYLEPHRINE 80 MCG/ML (10ML) SYRINGE FOR IV PUSH (FOR BLOOD PRESSURE SUPPORT)
PREFILLED_SYRINGE | INTRAVENOUS | Status: AC
  Filled 2023-04-14: qty 10

## 2023-04-14 MED ORDER — ALBUMIN HUMAN 5 % IV SOLN
12.5000 g | Freq: Once | INTRAVENOUS | Status: AC
  Administered 2023-04-14: 12.5 g via INTRAVENOUS

## 2023-04-14 MED ORDER — ACETAMINOPHEN 10 MG/ML IV SOLN
1000.0000 mg | Freq: Four times a day (QID) | INTRAVENOUS | Status: AC
  Administered 2023-04-14 – 2023-04-15 (×4): 1000 mg via INTRAVENOUS
  Filled 2023-04-14 (×4): qty 100

## 2023-04-14 MED ORDER — BUPIVACAINE LIPOSOME 1.3 % IJ SUSP
INTRAMUSCULAR | Status: DC | PRN
  Administered 2023-04-14: 20 mL

## 2023-04-14 MED ORDER — TRAVASOL 10 % IV SOLN
INTRAVENOUS | Status: AC
  Filled 2023-04-14: qty 480

## 2023-04-14 MED ORDER — PHENYLEPHRINE 80 MCG/ML (10ML) SYRINGE FOR IV PUSH (FOR BLOOD PRESSURE SUPPORT)
PREFILLED_SYRINGE | INTRAVENOUS | Status: DC | PRN
  Administered 2023-04-14 (×4): 160 ug via INTRAVENOUS

## 2023-04-14 MED ORDER — ACETAMINOPHEN 10 MG/ML IV SOLN: 1000.0000 mg | Freq: Once | INTRAVENOUS | Status: DC | PRN

## 2023-04-14 MED ORDER — PHENYLEPHRINE HCL-NACL 20-0.9 MG/250ML-% IV SOLN
INTRAVENOUS | Status: AC
  Filled 2023-04-14: qty 250

## 2023-04-14 MED ORDER — SUCCINYLCHOLINE CHLORIDE 200 MG/10ML IV SOSY
PREFILLED_SYRINGE | INTRAVENOUS | Status: DC | PRN
  Administered 2023-04-14: 100 mg via INTRAVENOUS

## 2023-04-14 MED ORDER — PROPOFOL 10 MG/ML IV BOLUS
INTRAVENOUS | Status: AC
  Filled 2023-04-14: qty 20

## 2023-04-14 MED ORDER — ORAL CARE MOUTH RINSE: 15.0000 mL | Freq: Once | OROMUCOSAL | Status: DC

## 2023-04-14 MED ORDER — SODIUM CHLORIDE (PF) 0.9 % IJ SOLN
INTRAMUSCULAR | Status: AC
  Filled 2023-04-14: qty 10

## 2023-04-14 SURGICAL SUPPLY — 37 items
APL SWBSTK 6 STRL LF DISP (MISCELLANEOUS) ×1
APPLICATOR COTTON TIP 6 STRL (MISCELLANEOUS) ×2 IMPLANT
APPLICATOR COTTON TIP 6IN STRL (MISCELLANEOUS) ×1 IMPLANT
BAG COUNTER SPONGE SURGICOUNT (BAG) IMPLANT
BAG SPNG CNTER NS LX DISP (BAG)
BLADE EXTENDED COATED 6.5IN (ELECTRODE) IMPLANT
BLADE HEX COATED 2.75 (ELECTRODE) ×2 IMPLANT
COVER MAYO STAND STRL (DRAPES) IMPLANT
COVER SURGICAL LIGHT HANDLE (MISCELLANEOUS) ×2 IMPLANT
DRAPE LAPAROSCOPIC ABDOMINAL (DRAPES) ×2 IMPLANT
DRAPE UTILITY XL STRL (DRAPES) ×2 IMPLANT
DRAPE WARM FLUID 44X44 (DRAPES) IMPLANT
DRSG OPSITE POSTOP 4X8 (GAUZE/BANDAGES/DRESSINGS) IMPLANT
ELECT REM PT RETURN 15FT ADLT (MISCELLANEOUS) ×2 IMPLANT
GAUZE SPONGE 4X4 12PLY STRL (GAUZE/BANDAGES/DRESSINGS) ×2 IMPLANT
GLOVE BIO SURGEON STRL SZ7 (GLOVE) ×2 IMPLANT
GLOVE BIOGEL PI IND STRL 7.0 (GLOVE) ×2 IMPLANT
GLOVE BIOGEL PI IND STRL 7.5 (GLOVE) ×2 IMPLANT
GOWN STRL REUS W/ TWL LRG LVL3 (GOWN DISPOSABLE) ×4 IMPLANT
GOWN STRL REUS W/TWL LRG LVL3 (GOWN DISPOSABLE) ×2
HANDLE SUCTION POOLE (INSTRUMENTS) IMPLANT
KIT BASIN OR (CUSTOM PROCEDURE TRAY) ×2 IMPLANT
KIT TURNOVER KIT A (KITS) IMPLANT
NS IRRIG 1000ML POUR BTL (IV SOLUTION) IMPLANT
PACK GENERAL/GYN (CUSTOM PROCEDURE TRAY) ×2 IMPLANT
STAPLER VISISTAT 35W (STAPLE) ×2 IMPLANT
SUCTION POOLE HANDLE (INSTRUMENTS)
SUT NOVA NAB DX-16 0-1 5-0 T12 (SUTURE) IMPLANT
SUT PDS AB 1 CT1 27 (SUTURE) IMPLANT
SUT SILK 2 0 SH CR/8 (SUTURE) IMPLANT
SUT SILK 3 0 (SUTURE)
SUT SILK 3 0 SH CR/8 (SUTURE) IMPLANT
SUT SILK 3-0 18XBRD TIE 12 (SUTURE) IMPLANT
TOWEL OR 17X26 10 PK STRL BLUE (TOWEL DISPOSABLE) ×4 IMPLANT
TRAY FOLEY MTR SLVR 16FR STAT (SET/KITS/TRAYS/PACK) ×2 IMPLANT
WATER STERILE IRR 1000ML POUR (IV SOLUTION) IMPLANT
YANKAUER SUCT BULB TIP NO VENT (SUCTIONS) IMPLANT

## 2023-04-14 NOTE — Progress Notes (Signed)
Per lab, procalcitonin can be added on to previous labs.

## 2023-04-14 NOTE — Progress Notes (Signed)
[facility]                                                                              Nicole Oconnor, is a 77 y.o. female, DOB - 1946/06/25, UYQ:[id number] Admit date - 04/09/2023    Outpatient Primary MD for the patient is Nicole Oconnor, [facility]  LOS - 5  days  Chief Complaint  Patient presents with   Emesis       Brief summary   Patient is a 77 year old female with pheochromocytoma s/p left adrenalectomy in 10/2021, solitary right kidney, history of SBO s/p ex lap and lysis of additions in 05/2021, HTN presented with abdominal pain, nausea and vomiting for last 3 days.  Patient reported last BM on 8/13, since then persistent nausea and vomiting, unable to maintain p.o. intake. In ED, found to have SBO, attempt to place an NG tube in ED was unsuccessful.  Admitted for further workup.  Assessment & Plan    Principal Problem:   SBO (small bowel obstruction) (HCC) -Multiple prior abdominal surgeries, prior history of SBO with ex lap and lysis of adhesions in 05/2021 -NGT was clamped on 8/18 however did not tolerate, became nauseated and distended -Seen this morning, no flatus or bowel movement, plan for ex lap with lysis of adhesions today, n.p.o. -Started on TPN on 8/19, PICC line placed  active Problems:   Essential hypertension -BP soft, hold oral antihypertensives (outpatient on olmesartan, HCTZ, atenolol) -Placed on IVF  Acute kidney injury -Creatinine 2.54 on admission, was 0.75 in 06/2022 -Likely prerenal due to poor p.o. intake, SBO -Creatinine improving, continue gentle hydration, TPN  Hypernatremia -Likely due to dehydration and water deficit -Started on IV fluid hydration, on TPN -Sodium improving  Hypokalemia -Replace as needed    Obesity Estimated body mass index is 30.85 kg/m as calculated from the following:   Height as of this encounter: 5\' 3"  (1.6 m).   Weight as of this encounter: 79 kg.  Code Status: Full code DVT  Prophylaxis:  heparin injection 5,000 Units Start: 04/09/23 2200   Level of Care: Level of care: Med-Surg Family Communication: Updated patient Disposition Plan:      Remains inpatient appropriate: OR today   Procedures:    Consultants:   General Surgery  Antimicrobials:   Anti-infectives (From admission, onward)    Start     Dose/Rate Route Frequency Ordered Stop   04/14/23 1100  [MAR Hold]  cefoTEtan (CEFOTAN) 2 g in sodium chloride 0.9 % 100 mL IVPB        (MAR Hold since Tue 04/14/2023 at 1159.Hold Reason: Transfer to a Procedural area)   2 g 200 mL/hr over 30 Minutes Intravenous On call to O.R. 04/14/23 0845 04/14/23 1330          Medications  [MAR Hold] bisacodyl  10 mg Rectal Daily   [MAR Hold] heparin  5,000 Units Subcutaneous Q8H   [MAR Hold] insulin aspart  0-9 Units Subcutaneous Q6H   [MAR Hold] metoCLOPramide (REGLAN) injection  10 mg Intravenous Q6H   [MAR Hold] sodium chloride flush  10-40 mL Intracatheter Q12H   [MAR Hold] thiamine (VITAMIN B1) injection  100 mg Intravenous Q24H      Subjective:   Nicole Oconnor was seen and examined today.  Seen this morning, still on NGT to intermittent wall suction.  No BM or flatus.  No ongoing nausea vomiting, fevers  Objective:   Vitals:   04/14/23 0500 04/14/23 0558 04/14/23 1205 04/14/23 1221  BP:  94/73 108/71   Pulse:  95 93   Resp:  16 16   Temp:  97.9 F (36.6 C) 98.6 F (37 C)   TempSrc:  Oral Oral   SpO2:  94% 100%   Weight: 79.1 kg   79 kg  Height:    5\' 3"  (1.6 m)    Intake/Output Summary (Last 24 hours) at 04/14/2023 1404 Last data filed at 04/14/2023 1353 Gross per 24 hour  Intake 1033.33 ml  Output 2125 ml  Net -1091.67 ml     Wt Readings from Last 3 Encounters:  04/14/23 79 kg  06/14/20 79.4 kg  12/30/18 111.1 kg   Physical Exam General: Alert and oriented x 3, NAD, ill-appearing Cardiovascular: S1 S2 clear, RRR.  Respiratory: CTAB Gastrointestinal: Soft, mild diffuse TTP,  distended Ext: no pedal edema bilaterally Neuro: no new deficits Psych: normal affect   Data Reviewed:  I have personally reviewed following labs    CBC Lab Results  Component Value Date   WBC 16.0 (H) 04/14/2023   RBC 4.35 04/14/2023   HGB 12.0 04/14/2023   HCT 38.4 04/14/2023   MCV 88.3 04/14/2023   MCH 27.6 04/14/2023   PLT 213 04/14/2023   MCHC 31.3 04/14/2023   RDW 14.4 04/14/2023   LYMPHSABS 0.7 04/09/2023   MONOABS 0.8 04/09/2023   EOSABS 0.0 04/09/2023   BASOSABS 0.0 04/09/2023     Last metabolic panel Lab Results  Component Value Date   NA 142 04/14/2023   K 3.8 04/14/2023   CL 93 (L) 04/14/2023   CO2 38 (H) 04/14/2023   BUN 78 (H) 04/14/2023   CREATININE 1.32 (H) 04/14/2023   GLUCOSE 183 (H) 04/14/2023   GFRNONAA 41 (L) 04/14/2023   CALCIUM 9.1 04/14/2023   PHOS 2.5 04/14/2023   PROT 7.3 04/14/2023   ALBUMIN 3.3 (L) 04/14/2023   BILITOT 1.5 (H) 04/14/2023   ALKPHOS 80 04/14/2023   AST 26 04/14/2023   ALT 11 04/14/2023   ANIONGAP 11 04/14/2023    CBG (last 3)  Recent Labs    04/13/23 2355 04/14/23 0558 04/14/23 1252  GLUCAP 184* 168* 168*      Coagulation Profile: No results for input(s): "INR", "PROTIME" in the last 168 hours.   Radiology Studies: I have personally reviewed the imaging studies  DG Abd Portable 2V  Result Date: 04/13/2023 CLINICAL DATA:  Small bowel obstruction. EXAM: PORTABLE ABDOMEN - 2 VIEW COMPARISON:  Abdominal radiographs 04/12/2023 FINDINGS: An enteric tube remains in place overlying the region of the gastric body. There is persistent dilatation of bowel loops in the central abdomen demonstrating mild mixed interval changes compared to the prior study. Gas and a small amount of stool are present in the colon, and gas is also present in the rectum. IMPRESSION: Persistent small bowel dilatation suggestive of ongoing (potentially partial) obstruction. Electronically Signed   By: Nicole Oconnor M.D.   On: 04/13/2023 14:58    [place] EKG SITE RITE  Result Date: 04/13/2023 If Site Rite image not attached, placement could not be confirmed due to current cardiac rhythm.      Nicole Oconnor M.D. [facility] 04/14/2023, 2:04 PM  Available via Epic secure chat 7am-7pm After 7 pm, please refer to night coverage provider listed on amion.

## 2023-04-14 NOTE — Progress Notes (Addendum)
PHARMACY - TOTAL PARENTERAL NUTRITION CONSULT NOTE   Indication: Prolonged ileus  Patient Measurements: Height: 5\' 3"  (160 cm) Weight: 79.1 kg (174 lb 6.1 oz) IBW/kg (Calculated) : 52.4 TPN AdjBW (KG): 60.3 Body mass index is 30.89 kg/m.   Assessment:  Patient is a 77 year old female with pheochromocytoma s/p left adrenalectomy in 10/2021, solitary right kidney, history of SBO s/p ex lap and lysis of additions in 05/2021, HTN who  presented with abdominal pain, nausea and vomiting.  Patient reported last BM on 8/13, since then persistent nausea and vomiting, unable to maintain p.o. intake.  Admitted for SBO, which is not responding to conservative treatment.  Surgery recommending exploratory laparotomy with lysis of adhesions.  Pharmacy consulted for TPN.   Glucose / Insulin:  8/20 Glucose= 183 (Goal <150) CBGs range/24hrs: 149-184 (5 units insulin used in 24hrs) Sensitive SSI q6h Electrolytes: Phos borderline low, significant decrease from 4.3 to  2.5.   Chloride low.  Magnesium elevated.  CO2 elevated.  Potassium & Corrected Calcium=9.6 WNL. Renal: 8/20 Scr=1.32, improved (CrCl=35) Hepatic:  AST/ALT/Alk phos WNL; T. Bili=1.5, elevated Triglycerides= 79, wnl Intake / Output; MIVF:  - MIVF 1/2NS at 47ml/hr per MD - NG LIWS - 8/19 7 am to 8/20 7am: UOP:  NG output: GI Imaging: GI Surgeries / Procedures:   Central access: PICC TPN start date: 8/19  Nutritional Goals: Goal TPN rate is 80 mL/hr (provides 96 g of protein and 1863 kcals per day)  RD Assessment: Estimated Needs Total Energy Estimated Needs: 1800-2000 kcals Total Protein Estimated Needs: 90-110 grams Total Fluid Estimated Needs: >/= 1.8L  Current Nutrition:  NPO TPN  Plan:   - Potassium Phosphate 12 mmol IV x 1 dose now as runner outside of TPN  - TPN at 40 mL/hr at 1800 Electrolytes in TPN:  Na 10mEq/L K 55mEq/L Ca 16mEq/L  Mg 0 mEq/L Phos 15 mol/L Cl:Ac 2:1   - Add standard MVI and  trace elements to TPN.  Remove Chromium (AKI). - Continue to leave out Magnesium due to elevated level.   - Add back Phos for significant reduction in level.    - Thiamine 100mg  IV x 1, then 100mg  IV daily for 5 more days - Initiate Sensitive q6h SSI and adjust as needed  - Continue MIVF to 1/2NS at 19ml/hr at 1800 per MD order - CMP, Mag level, Phos level tomorrow AM - Monitor TPN labs on Mon/Thurs  Tacy Learn, PharmD Clinical Pharmacist 04/14/2023 9:09 AM

## 2023-04-14 NOTE — Anesthesia Preprocedure Evaluation (Addendum)
Anesthesia Evaluation  Patient identified by MRN, date of birth, ID band Patient awake    Reviewed: Allergy & Precautions, NPO status , Patient's Chart, lab work & pertinent test results  History of Anesthesia Complications Negative for: history of anesthetic complications  Airway Mallampati: III  TM Distance: >3 FB Neck ROM: Full    Dental no notable dental hx.    Pulmonary neg pulmonary ROS   Pulmonary exam normal        Cardiovascular hypertension, Normal cardiovascular exam     Neuro/Psych  Headaches    GI/Hepatic Neg liver ROS,,,SBO, NGT in place   Endo/Other  Left adrenal pheochromocytoma s/p resection  Renal/GU ARFRenal disease     Musculoskeletal negative musculoskeletal ROS (+)    Abdominal   Peds  Hematology negative hematology ROS (+)   Anesthesia Other Findings Day of surgery medications reviewed with patient.  Reproductive/Obstetrics                              Anesthesia Physical Anesthesia Plan  ASA: 3  Anesthesia Plan: General   Post-op Pain Management: Ofirmev IV (intra-op)*   Induction: Intravenous and Rapid sequence  PONV Risk Score and Plan: 3 and Treatment may vary due to age or medical condition, Ondansetron, Dexamethasone, Midazolam and Propofol infusion  Airway Management Planned: Oral ETT  Additional Equipment: None  Intra-op Plan:   Post-operative Plan: Extubation in OR  Informed Consent: I have reviewed the patients History and Physical, chart, labs and discussed the procedure including the risks, benefits and alternatives for the proposed anesthesia with the patient or authorized representative who has indicated his/her understanding and acceptance.     Dental advisory given  Plan Discussed with: CRNA  Anesthesia Plan Comments:         Anesthesia Quick Evaluation

## 2023-04-14 NOTE — Progress Notes (Signed)
Day of Surgery   Subjective/Chief Complaint: No flatus or BM Becomes quite bloated with minimal NG clamping   Objective: Vital signs in last 24 hours: Temp:  [97.9 F (36.6 C)-98.5 F (36.9 C)] 97.9 F (36.6 C) (08/20 0558) Pulse Rate:  [95-101] 95 (08/20 0558) Resp:  [14-16] 16 (08/20 0558) BP: (94-107)/(61-73) 94/73 (08/20 0558) SpO2:  [92 %-94 %] 94 % (08/20 0558) Weight:  [79.1 kg] 79.1 kg (08/20 0500) Last BM Date : 04/08/23  Intake/Output from previous day: 08/19 0701 - 08/20 0700 In: 893.3 [P.O.:300; I.V.:593.3] Out: 2075 [Urine:1050; Emesis/NG output:1025] Intake/Output this shift: No intake/output data recorded.  WDWN in NAD NG functioning - some bilious drainage in tubing Abd - soft,  distended; mildly tender    Lab Results:  Recent Labs    04/13/23 0405 04/14/23 0328  WBC 13.6* 16.0*  HGB 12.2 12.0  HCT 39.6 38.4  PLT 220 213   BMET Recent Labs    04/13/23 0405 04/14/23 0328  NA 146* 142  K 3.2* 3.8  CL 92* 93*  CO2 32 38*  GLUCOSE 108* 183*  BUN 80* 78*  CREATININE 1.66* 1.32*  CALCIUM 9.6 9.1     Anti-infectives: Anti-infectives (From admission, onward)    Start     Dose/Rate Route Frequency Ordered Stop   04/14/23 1100  cefoTEtan (CEFOTAN) 2 g in sodium chloride 0.9 % 100 mL IVPB        2 g 200 mL/hr over 30 Minutes Intravenous On call to O.R. 04/14/23 0845 04/15/23 0559       Assessment/Plan: SBO - multiple prior abdominal surgeries including LOA for SBO, most recent was open left adrenalectomy last year at [facility] - NG tube - LIWS - At this point, she seems to have a persistent SBO not responding to conservative treatment.  Recommend exploratory laparotomy with lysis of adhesions.  Will proceed with surgery today.  The surgical procedure has been discussed with the patient.  Potential risks, benefits, alternative treatments, and expected outcomes have been explained.  All of the patient's questions at this time have been answered.   The likelihood of reaching the patient's treatment goal is good.  The patient understand the proposed surgical procedure and wishes to proceed.      FEN: PICC/ TPN; IVF per TRH VTE: SQH ID: no current abx   - per TRH -   HTN Hx of pheochromocytoma s/p open left adrenalectomy  AKI on CKD - pt is s/p left nephrectomy 1975  LOS: 5 days    Wynona Luna 04/14/2023

## 2023-04-14 NOTE — Anesthesia Postprocedure Evaluation (Signed)
Anesthesia Post Note  Patient: Nicole Oconnor  Procedure(s) Performed: EXPLORATORY LAPAROTOMY; LYSIS OF ADHESIONS     Patient location during evaluation: PACU Anesthesia Type: General Level of consciousness: awake and alert Pain management: pain level controlled Vital Signs Assessment: post-procedure vital signs reviewed and stable Respiratory status: spontaneous breathing, nonlabored ventilation and respiratory function stable Cardiovascular status: blood pressure returned to baseline Postop Assessment: no apparent nausea or vomiting Anesthetic complications: no   No notable events documented.  Last Vitals:  Vitals:   04/14/23 1615 04/14/23 1630  BP: (!) 82/60 (!) 85/59  Pulse: 95 90  Resp: (!) 23 (!) 24  Temp:    SpO2: 98% 98%    Last Pain:  Vitals:   04/14/23 1615  TempSrc:   PainSc: 2                  Shanda Howells

## 2023-04-14 NOTE — Progress Notes (Signed)
RN received call from pts daughter, Ander Slade requesting information regarding pt. RN verified with pt Ander Slade can get info regarding pts health. Joy is aware pts provider did say pt needs to have surgery due to SBO. Joy gave RN her cell, [phone] and her work number, [phone].

## 2023-04-14 NOTE — Transfer of Care (Signed)
Immediate Anesthesia Transfer of Care Note  Patient: Nicole Oconnor  Procedure(s) Performed: EXPLORATORY LAPAROTOMY; LYSIS OF ADHESIONS  Patient Location: PACU  Anesthesia Type:General  Level of Consciousness: awake and patient cooperative  Airway & Oxygen Therapy: Patient Spontanous Breathing and Patient connected to face mask oxygen  Post-op Assessment: Report given to RN and Post -op Vital signs reviewed and stable  Post vital signs: Reviewed and stable  Last Vitals:  Vitals Value Taken Time  BP 81/57 04/14/23 1519  Temp    Pulse 98 04/14/23 1523  Resp 28 04/14/23 1523  SpO2 100 % 04/14/23 1523  Vitals shown include unfiled device data.  Last Pain:  Vitals:   04/14/23 1221  TempSrc:   PainSc: 0-No pain         Complications: No notable events documented.

## 2023-04-14 NOTE — Care Management Important Message (Signed)
Important Message  Patient Details IM Letter given. Name: Nicole Oconnor MRN: [id number] Date of Birth: 01-Mar-1946   Medicare Important Message Given:  Yes     Caren Macadam 04/14/2023, 10:17 AM

## 2023-04-14 NOTE — Anesthesia Procedure Notes (Signed)
Procedure Name: Intubation Date/Time: 04/14/2023 1:08 PM  Performed by: Elyn Peers, CRNAPre-anesthesia Checklist: Patient identified, Emergency Drugs available, Suction available, Patient being monitored and Timeout performed Patient Re-evaluated:Patient Re-evaluated prior to induction Oxygen Delivery Method: Circle system utilized Preoxygenation: Pre-oxygenation with 100% oxygen Induction Type: IV induction and Rapid sequence Laryngoscope Size: Miller and 3 Grade View: Grade II Tube type: Oral Number of attempts: 1 Airway Equipment and Method: Stylet Placement Confirmation: ETT inserted through vocal cords under direct vision, positive ETCO2 and breath sounds checked- equal and bilateral Secured at: 23 cm Tube secured with: Tape Dental Injury: Teeth and Oropharynx as per pre-operative assessment

## 2023-04-14 NOTE — Op Note (Signed)
Pre-op diagnosis: Recurrent small bowel obstruction Postop diagnosis: Same Procedure performed: Exploratory laparotomy, extensive lysis of adhesions (90 minutes) Surgeon:Deedra Pro K Syerra Abdelrahman Assistant: Barnetta Chapel, PA-C Anesthesia: Endotracheal Indications: This is a 77 year old female with multiple abdominal surgeries and previous small bowel obstructions.  She presented on 04/09/2023 with abdominal pain, nausea, and vomiting.  CT scan was suspicious for small bowel obstruction.  She was managed initially with a nasogastric tube and a Gastrografin challenge.  She has not improved.  We recommended exploratory laparotomy.  The patient wanted an extra day to think about it.  She has had no progression over the last 24 hours.  We recommended exploratory laparotomy and lysis of adhesions.  Description of procedure: The patient was brought to the operating room and placed in the supine position on the operating room table.  After an adequate level general anesthesia was obtained, a Foley catheter was placed under sterile technique.  The patient's abdomen was prepped with ChloraPrep and draped sterile fashion.  A timeout was taken to ensure the proper patient and proper procedure.  Made a vertical midline incision using her old scar.  Dissection was carried down to the fascia with cautery.  We carefully dissected through the fascia at the linea alba.  We then bluntly dissected into the peritoneal cavity.  The omentum was densely adhered to the anterior abdominal wall.  We carefully took these adhesions down until we confirm that we are within the peritoneal cavity.  We then spent the next 90 minutes taking down adhesions in all directions.  Transverse colon is densely adherent down to the small bowel.  We are able to carefully dissect the bowel away from the mid transverse colon.  Once we had mobilized enough of the adhesions, we placed a Balfour retractor to assist with exposure.  We dissected down into the left  lower quadrant where we identified a clear transition zone.  There is a thick band of adhesions causing an obvious obstruction.  With lysis band and there was immediate release of the small bowel.  All the small bowel appears to be viable with no sign of perforation or ischemia.  The patient has other flimsy adhesions throughout the pelvis and the upper abdomen but there are no other signs of obstruction.  I made the decision not to take down the rest of the adhesions as we had clearly addressed the area of obstruction.  We irrigated thoroughly and inspected hemostasis.  The fascia was reapproximated #1 PDS.  We placed intermittent stay sutures of #1 Novafil.  The subcutaneous tissues were irrigated.  Staples were used to close the skin.  A honeycomb dressing is applied.  The patient was then extubated and brought to recovery room in stable condition.  All sponge, instrument, and needle counts are correct.  Wilmon Arms. Corliss Skains, MD, [facility]  General Surgery   04/14/2023 3:16 PM

## 2023-04-15 ENCOUNTER — Encounter (HOSPITAL_COMMUNITY): Payer: Self-pay | Admitting: Surgery

## 2023-04-15 DIAGNOSIS — K56609 Unspecified intestinal obstruction, unspecified as to partial versus complete obstruction: Secondary | ICD-10-CM | POA: Diagnosis not present

## 2023-04-15 LAB — COMPREHENSIVE METABOLIC PANEL
ALT: 16 U/L (ref 0–44)
AST: 40 U/L (ref 15–41)
Albumin: 2.9 g/dL — ABNORMAL LOW (ref 3.5–5.0)
Alkaline Phosphatase: 77 U/L (ref 38–126)
Anion gap: 10 (ref 5–15)
BUN: 57 mg/dL — ABNORMAL HIGH (ref 8–23)
CO2: 37 mmol/L — ABNORMAL HIGH (ref 22–32)
Calcium: 8.7 mg/dL — ABNORMAL LOW (ref 8.9–10.3)
Chloride: 97 mmol/L — ABNORMAL LOW (ref 98–111)
Creatinine, Ser: 1.07 mg/dL — ABNORMAL HIGH (ref 0.44–1.00)
GFR, Estimated: 53 mL/min — ABNORMAL LOW (ref >60)
Glucose, Bld: 150 mg/dL — ABNORMAL HIGH (ref 70–99)
Potassium: 3.8 mmol/L (ref 3.5–5.1)
Sodium: 144 mmol/L (ref 135–145)
Total Bilirubin: 1.2 mg/dL (ref 0.3–1.2)
Total Protein: 6.4 g/dL — ABNORMAL LOW (ref 6.5–8.1)

## 2023-04-15 LAB — GLUCOSE, CAPILLARY
Glucose-Capillary: 107 mg/dL — ABNORMAL HIGH (ref 70–99)
Glucose-Capillary: 116 mg/dL — ABNORMAL HIGH (ref 70–99)
Glucose-Capillary: 140 mg/dL — ABNORMAL HIGH (ref 70–99)
Glucose-Capillary: 155 mg/dL — ABNORMAL HIGH (ref 70–99)

## 2023-04-15 LAB — CBC
HCT: 30.8 % — ABNORMAL LOW (ref 36.0–46.0)
Hemoglobin: 9.5 g/dL — ABNORMAL LOW (ref 12.0–15.0)
MCH: 27.4 pg (ref 26.0–34.0)
MCHC: 30.8 g/dL (ref 30.0–36.0)
MCV: 88.8 fL (ref 80.0–100.0)
Platelets: 174 10*3/uL (ref 150–400)
RBC: 3.47 MIL/uL — ABNORMAL LOW (ref 3.87–5.11)
RDW: 14.5 % (ref 11.5–15.5)
WBC: 13 10*3/uL — ABNORMAL HIGH (ref 4.0–10.5)
nRBC: 0 % (ref 0.0–0.2)

## 2023-04-15 LAB — MAGNESIUM: Magnesium: 2.6 mg/dL — ABNORMAL HIGH (ref 1.7–2.4)

## 2023-04-15 LAB — PHOSPHORUS: Phosphorus: 3.7 mg/dL (ref 2.5–4.6)

## 2023-04-15 MED ORDER — BENZONATATE 100 MG PO CAPS: 100.0000 mg | ORAL_CAPSULE | Freq: Three times a day (TID) | ORAL | Status: DC | PRN

## 2023-04-15 MED ORDER — TRAVASOL 10 % IV SOLN
INTRAVENOUS | Status: AC
  Filled 2023-04-15: qty 720

## 2023-04-15 MED ORDER — SODIUM CHLORIDE 0.45 % IV SOLN: INTRAVENOUS | Status: AC

## 2023-04-15 MED ORDER — PANTOPRAZOLE SODIUM 40 MG IV SOLR
40.0000 mg | Freq: Two times a day (BID) | INTRAVENOUS | Status: DC
  Administered 2023-04-15 – 2023-04-23 (×17): 40 mg via INTRAVENOUS
  Filled 2023-04-15 (×17): qty 10

## 2023-04-15 NOTE — Progress Notes (Signed)
Mobility Specialist - Progress Note   04/15/23 0950  Mobility  Activity Ambulated with assistance in hallway  Level of Assistance Standby assist, set-up cues, supervision of patient - no hands on  Assistive Device Front wheel walker  Distance Ambulated (ft) 20 ft  Activity Response Tolerated well  Mobility Referral Yes  $Mobility charge 1 Mobility  Mobility Specialist Start Time (ACUTE ONLY) 0900  Mobility Specialist Stop Time (ACUTE ONLY) 0951  Mobility Specialist Time Calculation (min) (ACUTE ONLY) 51 min   Pt received in bed and agreeable to mobility. Pt was minA from STS. No complaints during session. Prior to getting into bed pt has urinated on self, requiring assistance with cleaning up. Pt required minA assist to get back into bed. Pt to bed after session with all needs met.      [facility]

## 2023-04-15 NOTE — Progress Notes (Signed)
1 Day Post-Op   Subjective/Chief Complaint: No bowel function.  Pain with movement as expected.  NGT working.   Objective: Vital signs in last 24 hours: Temp:  [97.5 F (36.4 C)-98.6 F (37 C)] 97.5 F (36.4 C) (08/21 0544) Pulse Rate:  [83-106] 83 (08/21 0544) Resp:  [16-43] 18 (08/21 0544) BP: (78-108)/(42-71) 93/60 (08/21 0544) SpO2:  [97 %-100 %] 98 % (08/21 0544) Weight:  [79 kg-81.4 kg] 81.4 kg (08/21 0500) Last BM Date : 04/08/23  Intake/Output from previous day: 08/20 0701 - 08/21 0700 In: 2327.2 [P.O.:30; I.V.:1653.6; IV Piggyback:643.6] Out: 2725 [Urine:1850; Emesis/NG output:800; Blood:75] Intake/Output this shift: Total I/O In: -  Out: 650 [Urine:650]  PE: Gen: NAD And: soft, appropriately tender, ND, incision c/d/I with staples and honeycomb dressing.  NGT with some bilious output. GU: foley with clear yellow urine.  Good output    Lab Results:  Recent Labs    04/14/23 0328 04/15/23 0456  WBC 16.0* 13.0*  HGB 12.0 9.5*  HCT 38.4 30.8*  PLT 213 174   BMET Recent Labs    04/14/23 0328 04/15/23 0456  NA 142 144  K 3.8 3.8  CL 93* 97*  CO2 38* 37*  GLUCOSE 183* 150*  BUN 78* 57*  CREATININE 1.32* 1.07*  CALCIUM 9.1 8.7*     Anti-infectives: Anti-infectives (From admission, onward)    Start     Dose/Rate Route Frequency Ordered Stop   04/14/23 1100  cefoTEtan (CEFOTAN) 2 g in sodium chloride 0.9 % 100 mL IVPB        2 g 200 mL/hr over 30 Minutes Intravenous On call to O.R. 04/14/23 0845 04/14/23 1330       Assessment/Plan: POD 1, s/p ex lap with LOA by Dr. Corliss Skains for SBO on 8/20 - dc foley -cont NGT and await bowel function -hgb down from 12 to 9.5 this morning.  Not much blood loss in OR yesterday.  Repeat in am - mobilize, pulm toilet -PT eval ordered    FEN: PICC/ TPN; IVF per TRH, NPO/NGT VTE: SQH ID: no current abx post op needed   - per TRH -   HTN Hx of pheochromocytoma s/p open left adrenalectomy  AKI on CKD - pt is  s/p left nephrectomy 1975  LOS: 6 days    Letha Cape 04/15/2023

## 2023-04-15 NOTE — Progress Notes (Signed)
PHARMACY - TOTAL PARENTERAL NUTRITION CONSULT NOTE   Indication: Prolonged ileus  Patient Measurements: Height: 5\' 3"  (160 cm) Weight: 81.4 kg (179 lb 7.3 oz) IBW/kg (Calculated) : 52.4 TPN AdjBW (KG): 59 Body mass index is 31.79 kg/m.   Assessment:  Patient is a 77 year old female with pheochromocytoma s/p left adrenalectomy in 10/2021, solitary right kidney, history of SBO s/p ex lap and lysis of additions in 05/2021, HTN who  presented with abdominal pain, nausea and vomiting.  Patient reported last BM on 8/13, since then persistent nausea and vomiting, unable to maintain p.o. intake.  Admitted for SBO, which is not responding to conservative treatment.  Surgery recommending exploratory laparotomy with lysis of adhesions (done 04/14/23).  Pharmacy consulted for TPN.   Glucose / Insulin: CBGs 155-180 (6 units SSI last 24h)  Electrolytes: Chloride low.  Mg 2.6 elevated.  CO2 elevated. CoCa 9.58  Renal: Scr 1.07 down  Hepatic:  AST/ALT/Alk phos WNL; T. Bili=1.2 down Triglycerides= 79, wnl (8/20)  Intake / Output; MIVF:  - IVF: 1/2 NS at 19ml/hr - NG LIWS: - UOP:  - Po: 30ml - LBM 8/14  GI Imaging: - 8/15: Developing or partial obstruction.  GI Surgeries / Procedures:  - 8/20: Exploratory laparotomy, extensive lysis of adhesions   Central access: PICC TPN start date: 8/19  Nutritional Goals: Goal TPN rate is 80 mL/hr (provides 96 g of protein and 1863 kcals per day)  RD Assessment: Estimated Needs Total Energy Estimated Needs: 1800-2000 kcals Total Protein Estimated Needs: 90-110 grams Total Fluid Estimated Needs: >/= 1.8L  Current Nutrition:  NPO TPN  Plan:  - Increase TPN to 48ml/hr today  Electrolytes in TPN:  Na 63mEq/L K 31mEq/L Ca 61mEq/L  Mg 0 mEq/L Phos 15 mol/L Cl:Ac max Cl   - Add standard MVI and trace elements to TPN.  Resume Chromium  - Thiamine 100mg  IV x 1, then 100mg  IV daily for 5 more days - Sensitive q6h SSI and adjust as  needed  - Decrease MIVF to 1/2NS at 76ml/hr at 1800 with TPN rate change - Monitor TPN labs on Mon/Thurs and prn  Alroy Portela S. Merilynn Finland, PharmD, BCPS Clinical Staff Pharmacist [email or web address] 04/15/2023 7:13 AM

## 2023-04-15 NOTE — Progress Notes (Signed)
Nutrition Brief Note  Visited patient at bedside who was spoon feeding herself ice chips. She is POD1 s/p ex lap LOA. NGT to LIWS observed suctioning dark green bilious matter. Awaiting bowel function to return  TPN @40  and patient tolerating, plans to advance to 60 ml/hr this evening   Labs:CBG 107, BUN 57, Cr 1.07, Mag 2.6   Meds: reviewed    I/O's: -9.2 L    Leodis Rains, RDN, LDN  Clinical Nutrition

## 2023-04-15 NOTE — Progress Notes (Signed)
PROGRESS NOTE    Nicole Oconnor  WUJ:[id number] DOB: Oct 11, 1945 DOA: 04/09/2023 PCP: Michaelle Birks [facility]   Brief Narrative: 77 year old past medical history significant for pheochromocytoma status post left adrenalectomy 10/2021, solitary right kidney, history of SBO status post exploratory laparotomy and lysis of adhesions 05/2021, hypertension presents with abdominal pain, nausea vomiting for the last 3 days prior to admission.  Unable to tolerate oral intakes.  Further evaluation she was found to have SBO, she failed to improve with conservative management.  She underwent exploratory laparotomy with lysis of adhesions on 8/20.     Assessment & Plan:   Principal Problem:   SBO (small bowel obstruction) (HCC) Active Problems:   Essential hypertension   Gout   Edema   H/O total adrenalectomy (HCC)   AKI (acute kidney injury) (HCC)   Protein-calorie malnutrition, severe   1-SBO: -Multiple prior abdominal surgery, prior history of SBO with history of exploratory laparotomy and lysis of adhesions 05/2021 -Presented with abdominal pain, constipation, had NG tube, she failed to improve with conservative management. -Underwent exploratory laparotomy and lysis of adhesions 8/20 -On TPN since 8/19, -Surgery following and managing  2-Hypertension: Blood pressure medication on hold due to soft-low blood pressure  3-AKI presented with a creatinine of 2.5, prior creatinine 0.7 Improved with hydration  4-Hyponatremia: Corrected with IV fluids  Hypokalemia replace as needed Obesity BMI 30 need lifestyle modification         Nutrition Problem: Severe Malnutrition Etiology: acute illness (SBO)    Signs/Symptoms: moderate muscle depletion, energy intake < or equal to 50% for > or equal to 5 days    Interventions: Refer to RD note for recommendations, TPN  Estimated body mass index is 31.79 kg/m as calculated from the following:   Height as of this encounter: 5\' 3"   (1.6 m).   Weight as of this encounter: 81.4 kg.   DVT prophylaxis: Heparin Code Status: Full code Family Communication: Daughter who was at bedside Disposition Plan:  Status is: Inpatient Remains inpatient appropriate because: Management of SBO    Consultants:  General Sx  Procedures:  Lysis of adhesion.   Antimicrobials:    Subjective: She is alert, report some abdominal pain, specially when she cough.    Objective: Vitals:   04/15/23 0157 04/15/23 0500 04/15/23 0544 04/15/23 1357  BP: 93/64  93/60 92/62  Pulse: 95  83 88  Resp: 18  18 18   Temp: (!) 97.5 F (36.4 C)  (!) 97.5 F (36.4 C) 98.1 F (36.7 C)  TempSrc: Oral  Oral Oral  SpO2: 100%  98% 98%  Weight:  81.4 kg    Height:        Intake/Output Summary (Last 24 hours) at 04/15/2023 1546 Last data filed at 04/15/2023 1410 Gross per 24 hour  Intake 1502.83 ml  Output 3400 ml  Net -1897.17 ml   Filed Weights   04/14/23 0500 04/14/23 1221 04/15/23 0500  Weight: 79.1 kg 79 kg 81.4 kg    Examination:  General exam: Appears calm and comfortable  Respiratory system: Clear to auscultation. Respiratory effort normal. Cardiovascular system: S1 & S2 heard, RRR. No JVD, murmurs, rubs, gallops or clicks. No pedal edema. Gastrointestinal system: Abdomen is nondistended, soft and nontender. Midline wound with honeycomb dressing.  Central nervous system: Alert and oriented.  Extremities: Symmetric 5 x 5 power.    Data Reviewed: I have personally reviewed following labs and imaging studies  CBC: Recent Labs  Lab 04/09/23 1356 04/10/23 0345  04/11/23 0808 04/12/23 0331 04/13/23 0405 04/14/23 0328 04/15/23 0456  WBC 5.8   < > 15.1* 14.1* 13.6* 16.0* 13.0*  NEUTROABS 4.3  --   --   --   --   --   --   HGB 11.9*   < > 12.1 11.8* 12.2 12.0 9.5*  HCT 36.5   < > 38.5 37.8 39.6 38.4 30.8*  MCV 84.5   < > 88.1 88.3 88.8 88.3 88.8  PLT 189   < > 166 199 220 213 174   < > = values in this interval not  displayed.   Basic Metabolic Panel: Recent Labs  Lab 04/11/23 0808 04/12/23 0331 04/13/23 0404 04/13/23 0405 04/14/23 0328 04/15/23 0456  NA 142 144  --  146* 142 144  K 3.3* 3.9  --  3.2* 3.8 3.8  CL 99 96*  --  92* 93* 97*  CO2 29 30  --  32 38* 37*  GLUCOSE 77 81  --  108* 183* 150*  BUN 66* 65*  --  80* 78* 57*  CREATININE 1.30* 1.21*  --  1.66* 1.32* 1.07*  CALCIUM 9.2 9.4  --  9.6 9.1 8.7*  MG  --   --  3.4*  --  3.1* 2.6*  PHOS  --   --   --  4.3 2.5 3.7   GFR: Estimated Creatinine Clearance: 43.8 mL/min (A) (by C-G formula based on SCr of 1.07 mg/dL (H)). Liver Function Tests: Recent Labs  Lab 04/09/23 1356 04/13/23 0405 04/14/23 0328 04/15/23 0456  AST 22  --  26 40  ALT 15  --  11 16  ALKPHOS 89  --  80 77  BILITOT 0.7  --  1.5* 1.2  PROT 8.1  --  7.3 6.4*  ALBUMIN 4.5 3.7 3.3* 2.9*   Recent Labs  Lab 04/09/23 1356  LIPASE 42   No results for input(s): "AMMONIA" in the last 168 hours. Coagulation Profile: No results for input(s): "INR", "PROTIME" in the last 168 hours. Cardiac Enzymes: No results for input(s): "CKTOTAL", "CKMB", "CKMBINDEX", "TROPONINI" in the last 168 hours. BNP (last 3 results) No results for input(s): "PROBNP" in the last 8760 hours. HbA1C: No results for input(s): "HGBA1C" in the last 72 hours. CBG: Recent Labs  Lab 04/14/23 0558 04/14/23 1252 04/14/23 2355 04/15/23 0601 04/15/23 1127  GLUCAP 168* 168* 180* 155* 107*   Lipid Profile: Recent Labs    04/14/23 0328  TRIG 79   Thyroid Function Tests: No results for input(s): "TSH", "T4TOTAL", "FREET4", "T3FREE", "THYROIDAB" in the last 72 hours. Anemia Panel: No results for input(s): "VITAMINB12", "FOLATE", "FERRITIN", "TIBC", "IRON", "RETICCTPCT" in the last 72 hours. Sepsis Labs: Recent Labs  Lab 04/14/23 0351  PROCALCITON 0.61    Recent Results (from the past 240 hour(s))  SARS Coronavirus 2 by RT PCR (hospital order, performed in [facility] hospital lab)  *cepheid single result test* Anterior Nasal Swab     Status: None   Collection Time: 04/09/23  2:47 PM   Specimen: Anterior Nasal Swab  Result Value Ref Range Status   SARS Coronavirus 2 by RT PCR NEGATIVE NEGATIVE Final    Comment: (NOTE) SARS-CoV-2 target nucleic acids are NOT DETECTED.  The SARS-CoV-2 RNA is generally detectable in upper and lower respiratory specimens during the acute phase of infection. The lowest concentration of SARS-CoV-2 viral copies this assay can detect is 250 copies / mL. A negative result does not preclude SARS-CoV-2 infection and should not be  used as the sole basis for treatment or other patient management decisions.  A negative result may occur with improper specimen collection / handling, submission of specimen other than nasopharyngeal swab, presence of viral mutation(s) within the areas targeted by this assay, and inadequate number of viral copies (<250 copies / mL). A negative result must be combined with clinical observations, patient history, and epidemiological information.  Fact Sheet for Patients:   RoadLapTop.co.za  Fact Sheet for Healthcare Providers: http://kim-miller.com/  This test is not yet approved or  cleared by the [place] FDA and has been authorized for detection and/or diagnosis of SARS-CoV-2 by FDA under an Emergency Use Authorization (EUA).  This EUA will remain in effect (meaning this test can be used) for the duration of the COVID-19 declaration under Section 564(b)(1) of the Act, 21 U.S.C. section 360bbb-3(b)(1), unless the authorization is terminated or revoked sooner.  Performed at [facility], [address], [place], [place] [address]          Radiology Studies: No results found.      Scheduled Meds:  Chlorhexidine Gluconate Cloth  6 each Topical Daily   heparin  5,000 Units Subcutaneous Q8H   insulin aspart  0-9 Units Subcutaneous Q6H    pantoprazole (PROTONIX) IV  40 mg Intravenous Q12H   sodium chloride flush  10-40 mL Intracatheter Q12H   thiamine (VITAMIN B1) injection  100 mg Intravenous Q24H   Continuous Infusions:  sodium chloride 50 mL/hr at 04/15/23 1421   sodium chloride     methocarbamol (ROBAXIN) IV 500 mg (04/15/23 1532)   ondansetron (ZOFRAN) IV     TPN ADULT (ION) 40 mL/hr at 04/14/23 1817   TPN ADULT (ION)       LOS: 6 days    Time spent: 35 minutes    Tameyah Koch A Edvardo Honse, MD [facility]   If 7PM-7AM, please contact night-coverage [email or web address]  04/15/2023, 3:46 PM

## 2023-04-16 ENCOUNTER — Inpatient Hospital Stay (HOSPITAL_COMMUNITY): Payer: Medicare Other

## 2023-04-16 DIAGNOSIS — K56609 Unspecified intestinal obstruction, unspecified as to partial versus complete obstruction: Secondary | ICD-10-CM | POA: Diagnosis not present

## 2023-04-16 LAB — CBC
HCT: 30.8 % — ABNORMAL LOW (ref 36.0–46.0)
Hemoglobin: 9.5 g/dL — ABNORMAL LOW (ref 12.0–15.0)
MCH: 27.8 pg (ref 26.0–34.0)
MCHC: 30.8 g/dL (ref 30.0–36.0)
MCV: 90.1 fL (ref 80.0–100.0)
Platelets: 188 10*3/uL (ref 150–400)
RBC: 3.42 MIL/uL — ABNORMAL LOW (ref 3.87–5.11)
RDW: 14.7 % (ref 11.5–15.5)
WBC: 12.8 10*3/uL — ABNORMAL HIGH (ref 4.0–10.5)
nRBC: 0 % (ref 0.0–0.2)

## 2023-04-16 LAB — COMPREHENSIVE METABOLIC PANEL
ALT: 29 U/L (ref 0–44)
AST: 29 U/L (ref 15–41)
Albumin: 2.7 g/dL — ABNORMAL LOW (ref 3.5–5.0)
Alkaline Phosphatase: 79 U/L (ref 38–126)
Anion gap: 8 (ref 5–15)
BUN: 37 mg/dL — ABNORMAL HIGH (ref 8–23)
CO2: 32 mmol/L (ref 22–32)
Calcium: 8.9 mg/dL (ref 8.9–10.3)
Chloride: 104 mmol/L (ref 98–111)
Creatinine, Ser: 0.79 mg/dL (ref 0.44–1.00)
GFR, Estimated: >60 mL/min (ref >60)
Glucose, Bld: 131 mg/dL — ABNORMAL HIGH (ref 70–99)
Potassium: 3.5 mmol/L (ref 3.5–5.1)
Sodium: 144 mmol/L (ref 135–145)
Total Bilirubin: 0.9 mg/dL (ref 0.3–1.2)
Total Protein: 6.4 g/dL — ABNORMAL LOW (ref 6.5–8.1)

## 2023-04-16 LAB — GLUCOSE, CAPILLARY
Glucose-Capillary: 143 mg/dL — ABNORMAL HIGH (ref 70–99)
Glucose-Capillary: 123 mg/dL — ABNORMAL HIGH (ref 70–99)
Glucose-Capillary: 116 mg/dL — ABNORMAL HIGH (ref 70–99)

## 2023-04-16 LAB — PHOSPHORUS: Phosphorus: 2.4 mg/dL — ABNORMAL LOW (ref 2.5–4.6)

## 2023-04-16 LAB — MAGNESIUM: Magnesium: 2.3 mg/dL (ref 1.7–2.4)

## 2023-04-16 MED ORDER — SODIUM CHLORIDE 0.45 % IV SOLN: INTRAVENOUS | Status: DC

## 2023-04-16 MED ORDER — TRAVASOL 10 % IV SOLN
INTRAVENOUS | Status: AC
  Filled 2023-04-16: qty 1094.4

## 2023-04-16 MED ORDER — ACETAMINOPHEN 10 MG/ML IV SOLN
1000.0000 mg | Freq: Four times a day (QID) | INTRAVENOUS | Status: AC
  Administered 2023-04-16 – 2023-04-17 (×3): 1000 mg via INTRAVENOUS
  Filled 2023-04-16 (×3): qty 100

## 2023-04-16 MED ORDER — POTASSIUM PHOSPHATES 15 MMOLE/5ML IV SOLN
15.0000 mmol | Freq: Once | INTRAVENOUS | Status: AC
  Administered 2023-04-16: 15 mmol via INTRAVENOUS
  Filled 2023-04-16: qty 5

## 2023-04-16 NOTE — Progress Notes (Addendum)
PHARMACY - TOTAL PARENTERAL NUTRITION CONSULT NOTE   Indication: Prolonged ileus  Patient Measurements: Height: 5\' 3"  (160 cm) Weight: 81 kg (178 lb 9.2 oz) IBW/kg (Calculated) : 52.4 TPN AdjBW (KG): 59 Body mass index is 31.63 kg/m.   Assessment:  Patient is a 77 year old female with pheochromocytoma s/p left adrenalectomy in 10/2021, solitary right kidney, history of SBO s/p ex lap and lysis of additions in 05/2021, HTN who  presented with abdominal pain, nausea and vomiting.  Patient reported last BM on 8/13, since then persistent nausea and vomiting, unable to maintain p.o. intake.  Admitted for SBO, which is not responding to conservative treatment.  Surgery recommending exploratory laparotomy with lysis of adhesions (done 04/14/23). Pharmacy consulted for TPN.   Glucose / Insulin: CBGs well controlled (goal 100-150) - 2 units SSI last 24h  Electrolytes: Potassium & Phos both dropped to borderline low today; all others WNL or normalized  Renal: AKI resolved; BUN still elevated but trending down appropriately; UOP low but question complete charting  Hepatic:  LFTs stable WNL; Tbili decreased to WNL; albumin remains low - TG WNL (8/20)  Intake / Output; MIVF:  - IVF: 1/2 NS at 30 ml/hr - NG: significantly up yesterday; over 1 L - PO: minimal (ice chips only) - LBM 8/14  GI Imaging: - 8/15 CTa/p: Developing or partial obstruction - 8/16, 8/18, 8/19 AXR: all show stable (possibly partial) SBO  GI Surgeries / Procedures:  - 8/20: Exploratory laparotomy, extensive lysis of adhesions   Central access: PICC TPN start date: 8/19  Nutritional Goals: Goal TPN rate is 80 mL/hr (provides 109 g of protein and 1992 kcals per day)  RD Assessment: Estimated Needs Total Energy Estimated Needs: 1800-2000 kcals Total Protein Estimated Needs: 90-110 grams Total Fluid Estimated Needs: >/= 1.8L  Current Nutrition:  NPO TPN  Plan:  KPhos 15 mmol IV x 1 per MD  Increase TPN to  goal rate of 80 ml/hr today Electrolytes in TPN: increase K Na 50 mEq/L K 30 >> 50 mEq/L Ca 5 mEq/L  Mg 0 mEq/L Phos 15 mmol/L Cl:Ac max Cl Add standard MVI and trace elements to TPN Thiamine 100 mg x 5 days Continue sensitive q6h SSI and adjust as needed  Decrease 1/2 NS to [facility] Monitor TPN labs on Mon/Thurs and prn Bmet, Phos tomorrow  Bernadene Person, PharmD, BCPS [phone] 04/16/2023, 7:48 AM

## 2023-04-16 NOTE — Progress Notes (Signed)
Patient ID: Nicole Oconnor, female   DOB: September 29, 1945, 77 y.o.   MRN: [id number] [facility] Surgery Progress Note  2 Days Post-Op  Subjective: CC-  Up in chair. Main complaint is NG tube. Denies significant abdominal pain. No flatus or BM. NG with 550cc bilious output over the last 24 hours.  Objective: Vital signs in last 24 hours: Temp:  [98.1 F (36.7 C)-98.7 F (37.1 C)] 98.3 F (36.8 C) (08/22 0611) Pulse Rate:  [88-90] 90 (08/22 0611) Resp:  [18] 18 (08/22 0611) BP: (92-110)/(62-69) 97/66 (08/22 0611) SpO2:  [96 %-99 %] 96 % (08/22 0611) Weight:  [81 kg] 81 kg (08/22 0500) Last BM Date : 04/08/23  Intake/Output from previous day: 08/21 0701 - 08/22 0700 In: 2564.7 [I.V.:2199.7; IV Piggyback:365] Out: 1400 [Urine:850; Emesis/NG output:550] Intake/Output this shift: Total I/O In: 30 [P.O.:30] Out: 300 [Urine:200; Emesis/NG output:100]  PE: Gen: Alert, NAD And: soft, ND, nontender, incision c/d/I with staples and honeycomb dressing.  NGT with some bilious output.  Lab Results:  Recent Labs    04/15/23 0456 04/16/23 0411  WBC 13.0* 12.8*  HGB 9.5* 9.5*  HCT 30.8* 30.8*  PLT 174 188   BMET Recent Labs    04/15/23 0456 04/16/23 0411  NA 144 144  K 3.8 3.5  CL 97* 104  CO2 37* 32  GLUCOSE 150* 131*  BUN 57* 37*  CREATININE 1.07* 0.79  CALCIUM 8.7* 8.9   PT/INR No results for input(s): "LABPROT", "INR" in the last 72 hours. CMP     Component Value Date/Time   NA 144 04/16/2023 0411   NA 144 03/03/2018 0000   K 3.5 04/16/2023 0411   CL 104 04/16/2023 0411   CO2 32 04/16/2023 0411   GLUCOSE 131 (H) 04/16/2023 0411   BUN 37 (H) 04/16/2023 0411   BUN 15 03/03/2018 0000   CREATININE 0.79 04/16/2023 0411   CALCIUM 8.9 04/16/2023 0411   PROT 6.4 (L) 04/16/2023 0411   ALBUMIN 2.7 (L) 04/16/2023 0411   AST 29 04/16/2023 0411   ALT 29 04/16/2023 0411   ALKPHOS 79 04/16/2023 0411   BILITOT 0.9 04/16/2023 0411   GFRNONAA >60 04/16/2023 0411    Lipase     Component Value Date/Time   LIPASE 42 04/09/2023 1356       Studies/Results: No results found.  Anti-infectives: Anti-infectives (From admission, onward)    Start     Dose/Rate Route Frequency Ordered Stop   04/14/23 1100  cefoTEtan (CEFOTAN) 2 g in sodium chloride 0.9 % 100 mL IVPB        2 g 200 mL/hr over 30 Minutes Intravenous On call to O.R. 04/14/23 0845 04/14/23 1330        Assessment/Plan POD #2, s/p ex lap with LOA by Dr. Corliss Skains for SBO on 8/20 - cont NGT and await return in bowel function. Continue TPN - mobilize, PT consult pending  - continue IV tylenol for pain  Acute blood loss anemia - Hgb stable at 9.5, monitor   FEN: PICC/ TPN; IVF per TRH, NPO/NGT VTE: SQH ID: no current abx post op needed Foley: out 8/21 and voiding   - per TRH -   HTN Hx of pheochromocytoma s/p open left adrenalectomy  AKI on CKD - pt is s/p left nephrectomy 1975    LOS: 7 days    Franne Forts, [facility] 04/16/2023, 10:51 AM Please see Amion for pager number during day hours 7:00am-4:30pm

## 2023-04-16 NOTE — Evaluation (Signed)
Physical Therapy Evaluation Patient Details Name: Nicole Oconnor MRN: [id number] DOB: 16-Aug-1946 Today's Date: 04/16/2023  History of Present Illness  77 year old past medical history significant for pheochromocytoma status post left adrenalectomy 10/2021, solitary right kidney, history of SBO status post exploratory laparotomy , hypertension presents 04/09/23 with abdominal pain, nausea vomiting .  Further evaluation she was found to have SBO, she failed to improve with conservative management.  She underwent exploratory laparotomy with lysis of adhesions on 8/20.  Clinical Impression  Pt admitted with above diagnosis.  Pt currently with functional limitations due to the deficits listed below (see PT Problem List). Pt will benefit from acute skilled PT to increase their independence and safety with mobility to allow discharge.    Patient  reports that she lives with her daughter who works, did not mention a spouse.  Patient reports ambulatory at baseline.  Patient having high output  from NG so did not  disconnect, only assisted to recliner. Patient may progress to return home if she hs caregivers. No family present.         If plan is discharge home, recommend the following: A little help with walking and/or transfers;A little help with bathing/dressing/bathroom;Assistance with cooking/housework;Assist for transportation   Can travel by private vehicle   No    Equipment Recommendations None recommended by PT  Recommendations for Other Services       Functional Status Assessment Patient has had a recent decline in their functional status and demonstrates the ability to make significant improvements in function in a reasonable and predictable amount of time.     Precautions / Restrictions Precautions Precaution Comments: NG suction, incontinent, wears pullups Restrictions Weight Bearing Restrictions: No      Mobility  Bed Mobility Overal bed mobility: Needs Assistance Bed  Mobility: Supine to Sit     Supine to sit: Mod assist     General bed mobility comments: assistance with legs nad to scoot to bed edge using bed pad    Transfers Overall transfer level: Needs assistance Equipment used: Rolling walker (2 wheels) Transfers: Sit to/from Stand, Bed to chair/wheelchair/BSC Sit to Stand: Min assist   Step pivot transfers: Min assist       General transfer comment: able to step to recliner using RW    Ambulation/Gait                  Stairs            Wheelchair Mobility     Tilt Bed    Modified Rankin (Stroke Patients Only)       Balance Overall balance assessment: Needs assistance Sitting-balance support: Bilateral upper extremity supported, Feet supported Sitting balance-Leahy Scale: Fair     Standing balance support: Bilateral upper extremity supported, During functional activity, No upper extremity supported Standing balance-Leahy Scale: Poor                               Pertinent Vitals/Pain Pain Assessment Pain Assessment: Faces Faces Pain Scale: Hurts even more Pain Location: abdomen Pain Descriptors / Indicators: Contraction, Discomfort, Guarding Pain Intervention(s): Limited activity within patient's tolerance, Monitored during session    Home Living Family/patient expects to be discharged to:: Private residence Living Arrangements: Children Available Help at Discharge: Family Type of Home: House Home Access: Stairs to enter   Secretary/administrator of Steps: 2   Home Layout: One level Home Equipment: Agricultural consultant (2 wheels) Additional  Comments: pt reports lives with daughter who works, did not mention spouse    Prior Function Prior Level of Function : Independent/Modified Independent                     Extremity/Trunk Assessment   Upper Extremity Assessment Upper Extremity Assessment: Generalized weakness    Lower Extremity Assessment Lower Extremity Assessment:  Generalized weakness    Cervical / Trunk Assessment Cervical / Trunk Assessment: Normal  Communication   Communication Communication: No apparent difficulties  Cognition Arousal: Alert Behavior During Therapy: WFL for tasks assessed/performed Overall Cognitive Status: Within Functional Limits for tasks assessed                                          General Comments      Exercises     Assessment/Plan    PT Assessment Patient needs continued PT services  PT Problem List Decreased strength;Decreased activity tolerance;Decreased mobility;Decreased knowledge of use of DME;Decreased knowledge of precautions;Decreased safety awareness;Pain       PT Treatment Interventions DME instruction;Therapeutic activities;Gait training;Functional mobility training;Therapeutic exercise;Patient/family education    PT Goals (Current goals can be found in the Care Plan section)  Acute Rehab PT Goals Patient Stated Goal: go home PT Goal Formulation: With patient Time For Goal Achievement: 04/30/23 Potential to Achieve Goals: Good    Frequency Min 1X/week     Co-evaluation               AM-PAC PT "6 Clicks" Mobility  Outcome Measure Help needed turning from your back to your side while in a flat bed without using bedrails?: A Lot Help needed moving from lying on your back to sitting on the side of a flat bed without using bedrails?: A Lot Help needed moving to and from a bed to a chair (including a wheelchair)?: A Little Help needed standing up from a chair using your arms (e.g., wheelchair or bedside chair)?: A Little Help needed to walk in hospital room?: Total Help needed climbing 3-5 steps with a railing? : Total 6 Click Score: 12    End of Session Equipment Utilized During Treatment: Gait belt Activity Tolerance: Patient tolerated treatment well Patient left: in chair;with call bell/phone within reach;with chair alarm set Nurse Communication: Mobility  status PT Visit Diagnosis: Unsteadiness on feet (R26.81);Muscle weakness (generalized) (M62.81)    Time: 7829-5621 PT Time Calculation (min) (ACUTE ONLY): 38 min   Charges:   PT Evaluation $PT Eval Low Complexity: 1 Low PT Treatments $Therapeutic Activity: 8-22 mins $Self Care/Home Management: 8-22 PT General Charges $$ ACUTE PT VISIT: 1 Visit         Blanchard Kelch PT Acute Rehabilitation Services Office [phone] Weekend [phone]   Rada Hay 04/16/2023, 1:47 PM

## 2023-04-16 NOTE — Progress Notes (Signed)
PROGRESS NOTE    Nicole Oconnor  HQI:[id number] DOB: [birth year] DOA: 04/09/2023 PCP: Michaelle Birks [facility]   Brief Narrative: 77 year old past medical history significant for pheochromocytoma status post left adrenalectomy 10/2021, solitary right kidney, history of SBO status post exploratory laparotomy and lysis of adhesions 05/2021, hypertension presents with abdominal pain, nausea vomiting for the last 3 days prior to admission.  Unable to tolerate oral intakes.  Further evaluation she was found to have SBO, she failed to improve with conservative management.  She underwent exploratory laparotomy with lysis of adhesions on 8/20.     Assessment & Plan:   Principal Problem:   SBO (small bowel obstruction) (HCC) Active Problems:   Essential hypertension   Gout   Edema   H/O total adrenalectomy (HCC)   AKI (acute kidney injury) (HCC)   Protein-calorie malnutrition, severe   1-SBO: -Multiple prior abdominal surgery, prior history of SBO with history of exploratory laparotomy and lysis of adhesions 05/2021. -Presented with abdominal pain, constipation, had NG tube, she failed to improve with conservative management. -Underwent exploratory laparotomy and lysis of adhesions 8/20 -On TPN since 8/19. -Surgery following and managing -Continue with NG tube, awaiting bowel function to return.   2-Hypertension: Blood pressure medication on hold due to soft-low blood pressure  3-AKI presented with a creatinine of 2.5, prior creatinine 0.7 Improved with hydration  4-Hyponatremia: Corrected with IV fluids  Hypokalemia replace as needed Obesity BMI 30 need lifestyle modification  Cough; report cough. Plan to proceed with Chest x ray.        Nutrition Problem: Severe Malnutrition Etiology: acute illness (SBO)    Signs/Symptoms: moderate muscle depletion, energy intake < or equal to 50% for > or equal to 5 days    Interventions: Refer to RD note for recommendations,  TPN  Estimated body mass index is 31.63 kg/m as calculated from the following:   Height as of this encounter: 5\' 3"  (1.6 m).   Weight as of this encounter: 81 kg.   DVT prophylaxis: Heparin Code Status: Full code Family Communication: Daughter who was at bedside 8/21 Disposition Plan:  Status is: Inpatient Remains inpatient appropriate because: Management of SBO    Consultants:  General Sx  Procedures:  Lysis of adhesion.   Antimicrobials:    Subjective: She is sitting recliner, alert, report persistent cough. Denies worsening abdominal pain.   Objective: Vitals:   04/15/23 2021 04/16/23 0500 04/16/23 0611 04/16/23 1132  BP: 110/69  97/66 92/64  Pulse: 89  90 93  Resp: 18  18 17   Temp: 98.7 F (37.1 C)  98.3 F (36.8 C) 98.3 F (36.8 C)  TempSrc: Oral  Oral Oral  SpO2: 99%  96% 97%  Weight:  81 kg    Height:        Intake/Output Summary (Last 24 hours) at 04/16/2023 1334 Last data filed at 04/16/2023 1132 Gross per 24 hour  Intake 2624.66 ml  Output 1200 ml  Net 1424.66 ml   Filed Weights   04/14/23 1221 04/15/23 0500 04/16/23 0500  Weight: 79 kg 81.4 kg 81 kg    Examination:  General exam: NAD Respiratory system: CTA Cardiovascular system: S1, S 2 RRR Gastrointestinal system: BS decreased, soft, tender, midline wound with honeycomb dressing.      Data Reviewed: I have personally reviewed following labs and imaging studies  CBC: Recent Labs  Lab 04/09/23 1356 04/10/23 0345 04/12/23 0331 04/13/23 0405 04/14/23 0328 04/15/23 0456 04/16/23 0411  WBC 5.8   < >  14.1* 13.6* 16.0* 13.0* 12.8*  NEUTROABS 4.3  --   --   --   --   --   --   HGB 11.9*   < > 11.8* 12.2 12.0 9.5* 9.5*  HCT 36.5   < > 37.8 39.6 38.4 30.8* 30.8*  MCV 84.5   < > 88.3 88.8 88.3 88.8 90.1  PLT 189   < > 199 220 213 174 188   < > = values in this interval not displayed.   Basic Metabolic Panel: Recent Labs  Lab 04/12/23 0331 04/13/23 0404 04/13/23 0405  04/14/23 0328 04/15/23 0456 04/16/23 0411  NA 144  --  146* 142 144 144  K 3.9  --  3.2* 3.8 3.8 3.5  CL 96*  --  92* 93* 97* 104  CO2 30  --  32 38* 37* 32  GLUCOSE 81  --  108* 183* 150* 131*  BUN 65*  --  80* 78* 57* 37*  CREATININE 1.21*  --  1.66* 1.32* 1.07* 0.79  CALCIUM 9.4  --  9.6 9.1 8.7* 8.9  MG  --  3.4*  --  3.1* 2.6* 2.3  PHOS  --   --  4.3 2.5 3.7 2.4*   GFR: Estimated Creatinine Clearance: 58.4 mL/min (by C-G formula based on SCr of 0.79 mg/dL). Liver Function Tests: Recent Labs  Lab 04/09/23 1356 04/13/23 0405 04/14/23 0328 04/15/23 0456 04/16/23 0411  AST 22  --  26 40 29  ALT 15  --  11 16 29   ALKPHOS 89  --  80 77 79  BILITOT 0.7  --  1.5* 1.2 0.9  PROT 8.1  --  7.3 6.4* 6.4*  ALBUMIN 4.5 3.7 3.3* 2.9* 2.7*   Recent Labs  Lab 04/09/23 1356  LIPASE 42   No results for input(s): "AMMONIA" in the last 168 hours. Coagulation Profile: No results for input(s): "INR", "PROTIME" in the last 168 hours. Cardiac Enzymes: No results for input(s): "CKTOTAL", "CKMB", "CKMBINDEX", "TROPONINI" in the last 168 hours. BNP (last 3 results) No results for input(s): "PROBNP" in the last 8760 hours. HbA1C: No results for input(s): "HGBA1C" in the last 72 hours. CBG: Recent Labs  Lab 04/15/23 1127 04/15/23 1727 04/15/23 2345 04/16/23 0612 04/16/23 1129  GLUCAP 107* 116* 140* 143* 123*   Lipid Profile: Recent Labs    04/14/23 0328  TRIG 79   Thyroid Function Tests: No results for input(s): "TSH", "T4TOTAL", "FREET4", "T3FREE", "THYROIDAB" in the last 72 hours. Anemia Panel: No results for input(s): "VITAMINB12", "FOLATE", "FERRITIN", "TIBC", "IRON", "RETICCTPCT" in the last 72 hours. Sepsis Labs: Recent Labs  Lab 04/14/23 0351  PROCALCITON 0.61    Recent Results (from the past 240 hour(s))  SARS Coronavirus 2 by RT PCR (hospital order, performed in [facility] hospital lab) *cepheid single result test* Anterior Nasal Swab     Status: None    Collection Time: 04/09/23  2:47 PM   Specimen: Anterior Nasal Swab  Result Value Ref Range Status   SARS Coronavirus 2 by RT PCR NEGATIVE NEGATIVE Final    Comment: (NOTE) SARS-CoV-2 target nucleic acids are NOT DETECTED.  The SARS-CoV-2 RNA is generally detectable in upper and lower respiratory specimens during the acute phase of infection. The lowest concentration of SARS-CoV-2 viral copies this assay can detect is 250 copies / mL. A negative result does not preclude SARS-CoV-2 infection and should not be used as the sole basis for treatment or other patient management decisions.  A negative  result may occur with improper specimen collection / handling, submission of specimen other than nasopharyngeal swab, presence of viral mutation(s) within the areas targeted by this assay, and inadequate number of viral copies (<250 copies / mL). A negative result must be combined with clinical observations, patient history, and epidemiological information.  Fact Sheet for Patients:   RoadLapTop.co.za  Fact Sheet for Healthcare Providers: http://kim-miller.com/  This test is not yet approved or  cleared by the [place] FDA and has been authorized for detection and/or diagnosis of SARS-CoV-2 by FDA under an Emergency Use Authorization (EUA).  This EUA will remain in effect (meaning this test can be used) for the duration of the COVID-19 declaration under Section 564(b)(1) of the Act, 21 U.S.C. section 360bbb-3(b)(1), unless the authorization is terminated or revoked sooner.  Performed at [facility], [address], [place], [place] [address]          Radiology Studies: No results found.      Scheduled Meds:  Chlorhexidine Gluconate Cloth  6 each Topical Daily   heparin  5,000 Units Subcutaneous Q8H   insulin aspart  0-9 Units Subcutaneous Q6H   pantoprazole (PROTONIX) IV  40 mg Intravenous Q12H   sodium chloride  flush  10-40 mL Intracatheter Q12H   thiamine (VITAMIN B1) injection  100 mg Intravenous Q24H   Continuous Infusions:  sodium chloride 30 mL/hr at 04/15/23 1739   sodium chloride     acetaminophen 1,000 mg (04/16/23 1149)   methocarbamol (ROBAXIN) IV 500 mg (04/16/23 1254)   ondansetron (ZOFRAN) IV     potassium PHOSPHATE IVPB (in mmol) 15 mmol (04/16/23 1146)   TPN ADULT (ION) 60 mL/hr at 04/15/23 1733   TPN ADULT (ION)       LOS: 7 days    Time spent: 35 minutes    Layne Dilauro A Shardea Cwynar, MD [facility]   If 7PM-7AM, please contact night-coverage [email or web address]  04/16/2023, 1:34 PM

## 2023-04-17 DIAGNOSIS — K56609 Unspecified intestinal obstruction, unspecified as to partial versus complete obstruction: Secondary | ICD-10-CM | POA: Diagnosis not present

## 2023-04-17 LAB — CBC
HCT: 31.1 % — ABNORMAL LOW (ref 36.0–46.0)
Hemoglobin: 9.5 g/dL — ABNORMAL LOW (ref 12.0–15.0)
MCH: 28.3 pg (ref 26.0–34.0)
MCHC: 30.5 g/dL (ref 30.0–36.0)
MCV: 92.6 fL (ref 80.0–100.0)
Platelets: 219 10*3/uL (ref 150–400)
RBC: 3.36 MIL/uL — ABNORMAL LOW (ref 3.87–5.11)
RDW: 14.8 % (ref 11.5–15.5)
WBC: 11.1 10*3/uL — ABNORMAL HIGH (ref 4.0–10.5)
nRBC: 0 % (ref 0.0–0.2)

## 2023-04-17 LAB — PHOSPHORUS: Phosphorus: 2.9 mg/dL (ref 2.5–4.6)

## 2023-04-17 LAB — BASIC METABOLIC PANEL
Anion gap: 7 (ref 5–15)
BUN: 35 mg/dL — ABNORMAL HIGH (ref 8–23)
CO2: 28 mmol/L (ref 22–32)
Calcium: 8.7 mg/dL — ABNORMAL LOW (ref 8.9–10.3)
Chloride: 107 mmol/L (ref 98–111)
Creatinine, Ser: 0.71 mg/dL (ref 0.44–1.00)
GFR, Estimated: >60 mL/min (ref >60)
Glucose, Bld: 124 mg/dL — ABNORMAL HIGH (ref 70–99)
Potassium: 3.8 mmol/L (ref 3.5–5.1)
Sodium: 142 mmol/L (ref 135–145)

## 2023-04-17 LAB — GLUCOSE, CAPILLARY
Glucose-Capillary: 143 mg/dL — ABNORMAL HIGH (ref 70–99)
Glucose-Capillary: 131 mg/dL — ABNORMAL HIGH (ref 70–99)
Glucose-Capillary: 103 mg/dL — ABNORMAL HIGH (ref 70–99)
Glucose-Capillary: 95 mg/dL (ref 70–99)

## 2023-04-17 MED ORDER — TRAVASOL 10 % IV SOLN
INTRAVENOUS | Status: AC
  Filled 2023-04-17: qty 1094.4

## 2023-04-17 MED ORDER — GUAIFENESIN ER 600 MG PO TB12
1200.0000 mg | ORAL_TABLET | Freq: Two times a day (BID) | ORAL | Status: DC
  Administered 2023-04-18 – 2023-04-23 (×9): 1200 mg via ORAL
  Filled 2023-04-17 (×12): qty 2

## 2023-04-17 NOTE — Progress Notes (Signed)
Nutrition Follow-up  DOCUMENTATION CODES:   Severe malnutrition in context of acute illness/injury  INTERVENTION:   -TPN management per Pharmacy -Continue 100 mg Thiamine daily x 5 days -Daily weights while on TPN  NUTRITION DIAGNOSIS:   Severe Malnutrition related to acute illness (SBO) as evidenced by moderate muscle depletion, energy intake < or equal to 50% for > or equal to 5 days.  Ongoing.  GOAL:   Patient will meet greater than or equal to 90% of their needs  Progressing with TPN  MONITOR:   Diet advancement, Labs, Weight trends, I & O's, TPN  ASSESSMENT:   77 y.o. 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 Admitted for SBO.  8/15: admitted 8/16: NGT placed 8/19: TPN initiated 8/20: s/p ex lap, LOA  Patient's NGT clamped and now on clear liquids, awaiting bowel movement.   TPN continues at 80 ml/hr, providing 1992 kcals and 109g protein.  Admission weight: 185 lbs Current weight: 190 lbs  Medications: Thiamine  Labs reviewed: CBGs: 103-143   Diet Order:   Diet Order             Diet clear liquid Fluid consistency: Thin  Diet effective now                   EDUCATION NEEDS:   Education needs have been addressed  Skin:  Skin Assessment: Reviewed RN Assessment  Last BM:  8/14  Height:   Ht Readings from Last 1 Encounters:  04/14/23 5\' 3"  (1.6 m)    Weight:   Wt Readings from Last 1 Encounters:  04/17/23 86.2 kg    BMI:  Body mass index is 33.66 kg/m.  Estimated Nutritional Needs:   Kcal:  1800-2000 kcals  Protein:  90-110 grams  Fluid:  >/= 1.8L   Tilda Franco, MS, RD, LDN Inpatient Clinical Dietitian Contact information available via Amion

## 2023-04-17 NOTE — Progress Notes (Signed)
PHARMACY - TOTAL PARENTERAL NUTRITION CONSULT NOTE   Indication: Prolonged ileus  Patient Measurements: Height: 5\' 3"  (160 cm) Weight: 86.2 kg (190 lb 0.6 oz) IBW/kg (Calculated) : 52.4 TPN AdjBW (KG): 59 Body mass index is 33.66 kg/m.   Assessment:  Patient is a 77 year old female with pheochromocytoma s/p left adrenalectomy in 10/2021, solitary right kidney, history of SBO s/p ex lap and lysis of additions in 05/2021, HTN who  presented with abdominal pain, nausea and vomiting.  Patient reported last BM on 8/13, since then persistent nausea and vomiting, unable to maintain p.o. intake.  Admitted for SBO, which is not responding to conservative treatment.  Surgery recommending exploratory laparotomy with lysis of adhesions (done 04/14/23). Pharmacy consulted for TPN.   Glucose / Insulin: CBGs well controlled (goal 116-143) - 3 units SSI last 24h  Electrolytes: WNL, CoCa 9.74  Renal: AKI resolved; BUN still elevated but trending down ; UOP low but some unmeasured  Hepatic:  LFTs stable WNL; Tbili decreased to WNL; albumin remains low - TG WNL (8/20)  Intake / Output; MIVF:  - IVF: 1/2 NS at kvo - NG: - PO: - LBM 8/14  GI Imaging: - 8/15 CTa/p: Developing or partial obstruction - 8/16, 8/18, 8/19 AXR: all show stable (possibly partial) SBO  GI Surgeries / Procedures:  - 8/20: Exploratory laparotomy, extensive lysis of adhesions   Central access: PICC TPN start date: 8/19  Nutritional Goals: Goal TPN rate is 80 mL/hr (provides 109 g of protein and 1992 kcals per day)  RD Assessment: Estimated Needs Total Energy Estimated Needs: 1800-2000 kcals Total Protein Estimated Needs: 90-110 grams Total Fluid Estimated Needs: >/= 1.8L  Current Nutrition:  NPO TPN  Plan:  No changes today  TPN at goal rate of 80 ml/hr today Electrolytes in TPN: increase K Na 50 mEq/L K 50 mEq/L Ca 5 mEq/L  Mg 0 mEq/L Phos 15 mmol/L Cl:Ac max Cl Add standard MVI and trace  elements to TPN Thiamine 100 mg x 5 days Continue sensitive q6h SSI and adjust as needed  Decrease 1/2 NS to [facility] Monitor TPN labs on Mon/Thurs and prn  Jill Ruppe S. Merilynn Finland, PharmD, BCPS Clinical Staff Pharmacist [email or web address] 04/17/2023, 8:21 AM

## 2023-04-17 NOTE — Evaluation (Signed)
Occupational Therapy Evaluation Patient Details Name: Nicole Oconnor MRN: [id number] DOB: Mar 22, 1946 Today's Date: 04/17/2023   History of Present Illness 77 year old  who presents w/ abdominal pain, nausea, vomiting .  She was found to have SBO which failed to improve with conservative management.  She underwent exploratory laparotomy / lysis of adhesions on 04/14/23. 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  She requires assist for tasks, including bed mobility, lower body dressing, sit to stand using a RW, and toileting. She will benefit from further OT services to maximize her independence with self-care tasks and to facilitate her safe return home. The pt nor her daughter are interested in short-term SNF rehab, rather prefer for the pt to return home with home health therapy upon discharge.       If plan is discharge home, recommend the following: Assistance with cooking/housework;Help with stairs or ramp for entrance;Assist for transportation;A little help with bathing/dressing/bathroom;A little help with walking and/or transfers    Functional Status Assessment  Patient has had a recent decline in their functional status and demonstrates the ability to make significant improvements in function in a reasonable and predictable amount of time.  Equipment Recommendations  None recommended by OT    Recommendations for Other Services       Precautions / Restrictions Precautions Precaution Comments: NG suction Restrictions Weight Bearing Restrictions: No      Mobility Bed Mobility Overal bed mobility: Needs Assistance Bed Mobility: Supine to Sit     Supine to sit: Mod assist           Transfers Overall transfer level: Needs assistance Equipment used: Rolling walker (2 wheels) Transfers: Sit to/from Stand, Bed to chair/wheelchair/BSC Sit to Stand: Min assist, From elevated surface     Step pivot transfers: Min assist     General transfer comment: able to step to recliner using RW      Balance       Sitting balance - Comments: static sitting-good. dynamic sitting-fair+       Standing balance comment: min assist with RW         ADL either performed or assessed with clinical judgement   ADL Overall ADL's : Needs assistance/impaired   Eating/Feeding Details (indicate cue type and reason): She is currently NPO, however she has adequate BUE function needed to self-feed independently. Grooming: Set up;Sitting           Upper Body Dressing : Minimal assistance;Sitting   Lower Body Dressing: Moderate assistance;Sitting/lateral leans Lower Body Dressing Details (indicate cue type and reason): based on clinical judgement     Toileting- Clothing Manipulation and Hygiene: Moderate assistance Toileting - Clothing Manipulation Details (indicate cue type and reason): at bathroom level, based on clinical judgement             Vision   Additional Comments: she correctly read the time depicted on the wall clock.            Pertinent Vitals/Pain Pain Assessment Pain Assessment: No/denies pain     Extremity/Trunk Assessment Upper Extremity Assessment Upper Extremity Assessment: LUE deficits/detail;RUE deficits/detail RUE Deficits / Details: AROM WFL. Functional grip strength LUE Deficits / Details: Chronic shoulder ROM limitations, with AROM for shoulder flexion being ~120  degrees. Elbow, wrist, and hand AROM WFL. Functional grip strength   Lower Extremity Assessment Lower Extremity Assessment: Generalized weakness       Communication Communication Communication: No apparent difficulties   Cognition Arousal: Alert Behavior During Therapy:  WFL for tasks assessed/performed Overall Cognitive Status: Within Functional Limits for tasks assessed          General Comments               Home Living Family/patient expects to be discharged to:: Private residence Living Arrangements: Children (daughter who works part-time)   Type of Home: [employer] Home Access: Ramped entrance     Home Layout: One level     Bathroom Shower/Tub: Producer, television/film/video: Handicapped height (with toilet rails)     Home Equipment: Agricultural consultant (2 wheels);Shower seat - built in          Prior Functioning/Environment Prior Level of Function : Independent/Modified Independent             Mobility Comments: She used a RW for ambulation. ADLs Comments: She was modified independent to independent with ADLs, except for needing supervision for showers. Her daughter managed the cooking and cleaning. She does not drive.        OT Problem List: Decreased strength;Decreased activity tolerance;Impaired balance (sitting and/or standing);Decreased knowledge of use of DME or AE;Decreased knowledge of precautions      OT Treatment/Interventions: Self-care/ADL training;Therapeutic exercise;Energy conservation;DME and/or AE instruction;Therapeutic activities;Balance training;Patient/family education    OT Goals(Current goals can be found in the care plan section) Acute Rehab OT Goals Patient Stated Goal: to return home at discharge OT Goal Formulation: With patient/family Time For Goal Achievement: 05/01/23 Potential to Achieve Goals: Good ADL Goals Pt Will Perform Grooming: with supervision;standing Pt Will Perform Upper Body Dressing: with set-up;sitting Pt Will Perform Lower Body Dressing: with supervision;sit to/from stand;sitting/lateral leans Pt Will Transfer to Toilet: ambulating;with supervision;grab bars Pt Will Perform Toileting - Clothing Manipulation and hygiene: with supervision;sit to/from stand  OT Frequency: Min  1X/week       AM-PAC OT "6 Clicks" Daily Activity     Outcome Measure Help from another person eating meals?: Total Help from another person taking care of personal grooming?: A Little Help from another person toileting, which includes using toliet, bedpan, or urinal?: A Lot Help from another person bathing (including washing, rinsing, drying)?: A Lot Help from another person to put on and taking off regular upper body clothing?: A Little Help from another person to put on and taking off regular lower body clothing?: A Lot 6 Click Score: 13   End of Session Equipment Utilized During Treatment: Rolling walker (2 wheels) Nurse Communication: Mobility status  Activity Tolerance: Patient tolerated treatment well Patient left: in chair;with call bell/phone within reach;with family/visitor present  OT Visit Diagnosis: Unsteadiness on feet (R26.81);Muscle weakness (generalized) (M62.81)                Time: 0981-1914 OT Time Calculation (min): 29 min Charges:  OT General Charges $OT Visit: 1 Visit OT Evaluation $OT Eval Moderate Complexity: 1 Mod OT Treatments $Therapeutic Activity: 8-22 mins    Reuben Likes, OTR/L 04/17/2023, 3:33 PM

## 2023-04-17 NOTE — Care Management Important Message (Signed)
Important Message  Patient Details IM Letter given. Name: Nicole Oconnor MRN: [id number] Date of Birth: 11/18/1945   Medicare Important Message Given:  Yes     Caren Macadam 04/17/2023, 8:32 AM

## 2023-04-17 NOTE — Progress Notes (Addendum)
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  Unable to tolerate oral intakes.  Further evaluation she was found to have SBO, she failed to improve with conservative management.  She underwent exploratory laparotomy with lysis of adhesions on 8/20.     Assessment & Plan:   Principal Problem:   SBO (small bowel obstruction) (HCC) Active Problems:   Essential hypertension   Gout   Edema   H/O total adrenalectomy (HCC)   AKI (acute kidney injury) (HCC)   Protein-calorie malnutrition, severe   1-SBO: -Multiple prior abdominal surgery, prior history of SBO with history of exploratory laparotomy and lysis of adhesions 05/2021. -Presented with abdominal pain, constipation, had NG tube, she failed to improve with conservative management. -Underwent exploratory laparotomy and lysis of adhesions 8/20 -On TPN since 8/19. -Surgery following and managing -she has been passing gas. NGT clamp. She will be started on clear liquids.   2-Hypertension: Blood pressure medication on hold due to soft-low blood pressure  3-AKI presented with a creatinine of 2.5, prior creatinine 0.7 Improved with hydration  4-Hyponatremia: Corrected with IV fluids  Hypokalemia replace as needed Obesity BMI 30 need lifestyle modification  Cough; report cough. Chest x ray with atelectasis.  Incentive spirometry, Flutter valve.  Start Guaifenesin.  Hypermagnesemia; in setting of AKI. Resolved.       Nutrition Problem: Severe Malnutrition Etiology: acute illness (SBO)    Signs/Symptoms: moderate muscle depletion,  energy intake < or equal to 50% for > or equal to 5 days    Interventions: Refer to RD note for recommendations, TPN  Estimated body mass index is 33.66 kg/m as calculated from the following:   Height as of this encounter: 5\' 3"  (1.6 m).   Weight as of this encounter: 86.2 kg.   DVT prophylaxis: Heparin Code Status: Full code Family Communication: Daughter who was at bedside 8/21 Disposition Plan:  Status is: Inpatient Remains inpatient appropriate because: Management of SBO    Consultants:  General Sx  Procedures:  Lysis of adhesion.   Antimicrobials:    Subjective: She is doing ok, still having cough, thick phlegm.  Passing gas.   Objective: Vitals:   04/17/23 0105 04/17/23 0500 04/17/23 0511 04/17/23 1317  BP: 112/76  111/70 117/78  Pulse: 90  90 85  Resp: 16  15 16   Temp: 98.7 F (37.1 C)  98.2 F (36.8 C) 98.6 F (37 C)  TempSrc: Oral  Oral Oral  SpO2: 96%  98% 97%  Weight:  86.2 kg    Height:        Intake/Output Summary (Last 24 hours) at 04/17/2023 1435 Last data filed at 04/17/2023 1400 Gross per 24 hour  Intake 1759.58 ml  Output 1400 ml  Net 359.58 ml   Filed Weights   04/15/23 0500 04/16/23 0500 04/17/23 0500  Weight: 81.4 kg 81 kg 86.2 kg    Examination:  General exam: NAD Respiratory system: CTA Cardiovascular system: S 1, S 2 RRR Gastrointestinal system:  midline wound with honeycomb dressing. Soft, mild tender.      Data Reviewed: I have personally reviewed following labs and imaging studies  CBC: Recent Labs  Lab 04/13/23  0405 04/14/23 0328 04/15/23 0456 04/16/23 0411 04/17/23 0828  WBC 13.6* 16.0* 13.0* 12.8* 11.1*  HGB 12.2 12.0 9.5* 9.5* 9.5*  HCT 39.6 38.4 30.8* 30.8* 31.1*  MCV 88.8 88.3 88.8 90.1 92.6  PLT 220 213 174 188 219   Basic Metabolic Panel: Recent Labs  Lab 04/13/23 0404 04/13/23 0405 04/14/23 0328 04/15/23 0456 04/16/23 0411 04/17/23 0245  NA  --  146* 142 144 144 142  K  --  3.2* 3.8 3.8  3.5 3.8  CL  --  92* 93* 97* 104 107  CO2  --  32 38* 37* 32 28  GLUCOSE  --  108* 183* 150* 131* 124*  BUN  --  80* 78* 57* 37* 35*  CREATININE  --  1.66* 1.32* 1.07* 0.79 0.71  CALCIUM  --  9.6 9.1 8.7* 8.9 8.7*  MG 3.4*  --  3.1* 2.6* 2.3  --   PHOS  --  4.3 2.5 3.7 2.4* 2.9   GFR: Estimated Creatinine Clearance: 60.3 mL/min (by C-G formula based on SCr of 0.71 mg/dL). Liver Function Tests: Recent Labs  Lab 04/13/23 0405 04/14/23 0328 04/15/23 0456 04/16/23 0411  AST  --  26 40 29  ALT  --  11 16 29   ALKPHOS  --  80 77 79  BILITOT  --  1.5* 1.2 0.9  PROT  --  7.3 6.4* 6.4*  ALBUMIN 3.7 3.3* 2.9* 2.7*   No results for input(s): "LIPASE", "AMYLASE" in the last 168 hours.  No results for input(s): "AMMONIA" in the last 168 hours. Coagulation Profile: No results for input(s): "INR", "PROTIME" in the last 168 hours. Cardiac Enzymes: No results for input(s): "CKTOTAL", "CKMB", "CKMBINDEX", "TROPONINI" in the last 168 hours. BNP (last 3 results) No results for input(s): "PROBNP" in the last 8760 hours. HbA1C: No results for input(s): "HGBA1C" in the last 72 hours. CBG: Recent Labs  Lab 04/16/23 1129 04/16/23 1734 04/17/23 0104 04/17/23 0509 04/17/23 1147  GLUCAP 123* 116* 143* 131* 103*   Lipid Profile: No results for input(s): "CHOL", "HDL", "LDLCALC", "TRIG", "CHOLHDL", "LDLDIRECT" in the last 72 hours.  Thyroid Function Tests: No results for input(s): "TSH", "T4TOTAL", "FREET4", "T3FREE", "THYROIDAB" in the last 72 hours. Anemia Panel: No results for input(s): "VITAMINB12", "FOLATE", "FERRITIN", "TIBC", "IRON", "RETICCTPCT" in the last 72 hours. Sepsis Labs: Recent Labs  Lab 04/14/23 0351  PROCALCITON 0.61    Recent Results (from the past 240 hour(s))  SARS Coronavirus 2 by RT PCR (hospital order, performed in [facility] hospital lab) *cepheid single result test* Anterior Nasal Swab     Status: None   Collection Time: 04/09/23  2:47 PM   Specimen:  Anterior Nasal Swab  Result Value Ref Range Status   SARS Coronavirus 2 by RT PCR NEGATIVE NEGATIVE Final    Comment: (NOTE) SARS-CoV-2 target nucleic acids are NOT DETECTED.  The SARS-CoV-2 RNA is generally detectable in upper and lower respiratory specimens during the acute phase of infection. The lowest concentration of SARS-CoV-2 viral copies this assay can detect is 250 copies / mL. A negative result does not preclude SARS-CoV-2 infection and should not be used as the sole basis for treatment or other patient management decisions.  A negative result may occur with improper specimen collection / handling, submission of specimen other than nasopharyngeal swab, presence of viral mutation(s) within the areas targeted by this assay, and inadequate number of viral copies (<250 copies / mL). A negative result must be combined with clinical observations,  patient history, and epidemiological information.  Fact Sheet for Patients:   RoadLapTop.co.za  Fact Sheet for Healthcare Providers: http://kim-miller.com/  This test is not yet approved or  cleared by the [place] FDA and has been authorized for detection and/or diagnosis of SARS-CoV-2 by FDA under an Emergency Use Authorization (EUA).  This EUA will remain in effect (meaning this test can be used) for the duration of the COVID-19 declaration under Section 564(b)(1) of the Act, 21 U.S.C. section 360bbb-3(b)(1), unless the authorization is terminated or revoked sooner.  Performed at [facility], [address] [place], [place] [address]          Radiology Studies: DG CHEST PORT 1 VIEW  Result Date: 04/16/2023 CLINICAL DATA:  Cough. EXAM: PORTABLE CHEST 1 VIEW COMPARISON:  February 07, 2005. FINDINGS: Stable cardiomediastinal silhouette. Nasogastric tube is seen entering stomach. Right-sided PICC line is noted. Hypoinflation of the lungs is noted. Right lung is clear.  Minimal left basilar subsegmental atelectasis is noted. Bony thorax is unremarkable. IMPRESSION: Hypoinflation of the lungs with minimal left basilar subsegmental atelectasis. Electronically Signed   By: Lupita Raider M.D.   On: 04/16/2023 13:37        Scheduled Meds:  Chlorhexidine Gluconate Cloth  6 each Topical Daily   guaiFENesin  1,200 mg Oral BID   heparin  5,000 Units Subcutaneous Q8H   insulin aspart  0-9 Units Subcutaneous Q6H   pantoprazole (PROTONIX) IV  40 mg Intravenous Q12H   sodium chloride flush  10-40 mL Intracatheter Q12H   thiamine (VITAMIN B1) injection  100 mg Intravenous Q24H   Continuous Infusions:  sodium chloride 10 mL/hr at 04/16/23 1737   methocarbamol (ROBAXIN) IV 500 mg (04/17/23 1209)   ondansetron (ZOFRAN) IV     TPN ADULT (ION) 80 mL/hr at 04/16/23 1717   TPN ADULT (ION)       LOS: 8 days    Time spent: 35 minutes    Uva Runkel A Tinzlee Craker, MD [facility]   If 7PM-7AM, please contact night-coverage [email or web address]  04/17/2023, 2:35 PM

## 2023-04-17 NOTE — Progress Notes (Signed)
Physical Therapy Treatment Patient Details Name: Nicole Oconnor MRN: [id number] DOB: March 30, 1946 Today's Date: 04/17/2023   History of Present Illness 77 year old  who presents w/ abdominal pain, nausea, vomiting .  She was found to have SBO which failed to improve with conservative management.  She underwent exploratory laparotomy / lysis of adhesions on 04/14/23. 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  Pt currently with functional limitations due to the deficits listed below (see PT Problem List). Pt seated in recliner when PT arrived. Pt reported she had been up for 4 hrs and was ready to go back to bed, Nurse tech and niece present. Pt agreeable to gait in hallway and then returning to bed. Pt required min A for sit to stand  from recliner with good recall for UE placement. Pt able to ambulate 28 feet in hallway with RW, CGA and min cues. Pt limited due to fatigue. Pt seated EOB and reported need to void bladder. PT assisted pt with SPT bed <> BSC and with hygiene tasks. Pt required min A for safety with transfer. Pt required max A for B LE for sit to supine and cues for log roll technique. Pt left in bed and positioned to pt preference and all needs in place. OT made PT aware family is requesting tp to d/c home with [facility] services vs SNF for short term rehab at this time. PT changed d/c recommendation. Pt will benefit from acute skilled PT to increase their independence and safety with mobility to allow discharge.      If plan is discharge home, recommend the following: A little help with walking and/or transfers;A little help with bathing/dressing/bathroom;Assistance with cooking/housework;Assist for transportation;Help with stairs or ramp for entrance   Can travel by private vehicle     Yes  Equipment Recommendations  None recommended by  PT    Recommendations for Other Services       Precautions / Restrictions Precautions Precaution Comments: NG suction Restrictions Weight Bearing Restrictions: No     Mobility  Bed Mobility Overal bed mobility: Needs Assistance Bed Mobility: Sit to Supine     Supine to sit: Max assist     General bed mobility comments: Max A for B LEs and cues for log roll to limit stress and pain on abdomen    Transfers Overall transfer level: Needs assistance Equipment used: Rolling walker (2 wheels) Transfers: Sit to/from Stand, Bed to chair/wheelchair/BSC Sit to Stand: Min assist Stand pivot transfers: Min assist         General transfer comment: min A for sit to stand from recliner, CGA for sit to stand from elevated EOB, and min A for SPT BSC <> bed with cues    Ambulation/Gait Ambulation/Gait assistance: Contact guard assist Gait Distance (Feet): 28 Feet Assistive device: Rolling walker (2 wheels) Gait Pattern/deviations: Step-to pattern, Wide base of support, Trunk flexed (L toe out) Gait velocity: decreased         Stairs             Wheelchair Mobility     Tilt Bed    Modified Rankin (Stroke Patients Only)       Balance Overall balance assessment: Needs assistance Sitting-balance support: Bilateral upper extremity supported, Feet supported Sitting balance-Leahy Scale: Good     Standing balance support: Bilateral upper extremity supported, During functional activity,  No upper extremity supported Standing balance-Leahy Scale: Poor Standing balance comment: CGA with RW                            Cognition Arousal: Alert Behavior During Therapy: WFL for tasks assessed/performed Overall Cognitive Status: Within Functional Limits for tasks assessed                                          Exercises      General Comments        Pertinent Vitals/Pain Pain Assessment Pain Assessment: Faces Faces Pain Scale:  Hurts little more Pain Location: abdomen and buttock Pain Descriptors / Indicators: Contraction, Discomfort, Guarding Pain Intervention(s): Limited activity within patient's tolerance    Home Living Family/patient expects to be discharged to:: Private residence Living Arrangements: Children (daughter who works part-time) Available Help at Discharge: Family Type of Home: House Home Access: Ramped entrance   Entrance Stairs-Number of Steps: 2   Home Layout: One level Home Equipment: Agricultural consultant (2 wheels);Shower seat - built in Additional Comments: pt reports lives with daughter who works, did not mention spouse    Prior Function            PT Goals (current goals can now be found in the care plan section) Acute Rehab PT Goals Patient Stated Goal: go home PT Goal Formulation: With patient Time For Goal Achievement: 04/30/23 Potential to Achieve Goals: Good Progress towards PT goals: Progressing toward goals    Frequency    Min 1X/week      PT Plan      Co-evaluation              AM-PAC PT "6 Clicks" Mobility   Outcome Measure  Help needed turning from your back to your side while in a flat bed without using bedrails?: A Lot Help needed moving from lying on your back to sitting on the side of a flat bed without using bedrails?: A Lot Help needed moving to and from a bed to a chair (including a wheelchair)?: A Little Help needed standing up from a chair using your arms (e.g., wheelchair or bedside chair)?: A Little Help needed to walk in hospital room?: A Little Help needed climbing 3-5 steps with a railing? : Total 6 Click Score: 14    End of Session Equipment Utilized During Treatment: Gait belt Activity Tolerance: Patient tolerated treatment well Patient left: with call bell/phone within reach;with bed alarm set Nurse Communication: Mobility status PT Visit Diagnosis: Unsteadiness on feet (R26.81);Muscle weakness (generalized) (M62.81)     Time:  1610-9604 PT Time Calculation (min) (ACUTE ONLY): 38 min  Charges:    $Gait Training: 8-22 mins $Therapeutic Activity: 23-37 mins PT General Charges $$ ACUTE PT VISIT: 1 Visit                     Nicole Oconnor, PT Acute Rehab   Nicole Oconnor 04/17/2023, 6:14 PM

## 2023-04-17 NOTE — Progress Notes (Signed)
3 Days Post-Op   Subjective/Chief Complaint: Patient reports a large amount of flatus, but no BM yet  No significant pain  Objective: Vital signs in last 24 hours: Temp:  [98.1 F (36.7 C)-98.7 F (37.1 C)] 98.2 F (36.8 C) (08/23 0511) Pulse Rate:  [86-93] 90 (08/23 0511) Resp:  [15-17] 15 (08/23 0511) BP: (92-112)/(64-76) 111/70 (08/23 0511) SpO2:  [96 %-99 %] 98 % (08/23 0511) Weight:  [86.2 kg] 86.2 kg (08/23 0500) Last BM Date : 04/08/23  Intake/Output from previous day: 08/22 0701 - 08/23 0700 In: 1699.6 [P.O.:120; I.V.:1407.2; IV Piggyback:172.4] Out: 1250 [Urine:600; Emesis/NG output:650] Intake/Output this shift: Total I/O In: -  Out: 200 [Urine:200]  Gen: Alert, NAD And: soft, non-distended, nontender, incision c/d/I with staples and honeycomb dressing.  NGT with some bilious output.  Lab Results:  Recent Labs    04/16/23 0411 04/17/23 0828  WBC 12.8* 11.1*  HGB 9.5* 9.5*  HCT 30.8* 31.1*  PLT 188 219   BMET Recent Labs    04/16/23 0411 04/17/23 0245  NA 144 142  K 3.5 3.8  CL 104 107  CO2 32 28  GLUCOSE 131* 124*  BUN 37* 35*  CREATININE 0.79 0.71  CALCIUM 8.9 8.7*   PT/INR No results for input(s): "LABPROT", "INR" in the last 72 hours. ABG No results for input(s): "PHART", "HCO3" in the last 72 hours.  Invalid input(s): "PCO2", "PO2"  Studies/Results: DG CHEST PORT 1 VIEW  Result Date: 04/16/2023 CLINICAL DATA:  Cough. EXAM: PORTABLE CHEST 1 VIEW COMPARISON:  February 07, 2005. FINDINGS: Stable cardiomediastinal silhouette. Nasogastric tube is seen entering stomach. Right-sided PICC line is noted. Hypoinflation of the lungs is noted. Right lung is clear. Minimal left basilar subsegmental atelectasis is noted. Bony thorax is unremarkable. IMPRESSION: Hypoinflation of the lungs with minimal left basilar subsegmental atelectasis. Electronically Signed   By: Lupita Raider M.D.   On: 04/16/2023 13:37    Anti-infectives: Anti-infectives (From  admission, onward)    Start     Dose/Rate Route Frequency Ordered Stop   04/14/23 1100  cefoTEtan (CEFOTAN) 2 g in sodium chloride 0.9 % 100 mL IVPB        2 g 200 mL/hr over 30 Minutes Intravenous On call to O.R. 04/14/23 0845 04/14/23 1330       Assessment/Plan: POD #3, s/p ex lap with LOA by Dr. Corliss Skains for SBO on 8/20 - Beginning to have some bowel function - clamp NG - begin clear liquids;  Continue TPN until able to advance diet - mobilize, PT consult - continue IV tylenol for pain   Acute blood loss anemia - Hgb stable at 9.5, monitor   FEN: PICC/ TPN; IVF per [facility], NPO/NGT VTE: SQH ID: no current abx post op needed Foley: out 8/21 and voiding   - per TRH -   HTN Hx of pheochromocytoma s/p open left adrenalectomy  AKI on CKD - pt is s/p left nephrectomy 1975  LOS: 8 days    Wynona Luna 04/17/2023

## 2023-04-17 NOTE — Plan of Care (Signed)
  Problem: Clinical Measurements: Goal: Ability to maintain clinical measurements within normal limits will improve Outcome: Progressing Goal: Will remain free from infection Outcome: Progressing Goal: Diagnostic test results will improve Outcome: Progressing Goal: Respiratory complications will improve Outcome: Progressing   Problem: Coping: Goal: Level of anxiety will decrease Outcome: Progressing   Problem: Elimination: Goal: Will not experience complications related to bowel motility Outcome: Progressing   Problem: Safety: Goal: Ability to remain free from injury will improve Outcome: Progressing   Problem: Skin Integrity: Goal: Risk for impaired skin integrity will decrease Outcome: Progressing

## 2023-04-18 DIAGNOSIS — K56609 Unspecified intestinal obstruction, unspecified as to partial versus complete obstruction: Secondary | ICD-10-CM | POA: Diagnosis not present

## 2023-04-18 LAB — BASIC METABOLIC PANEL
Anion gap: 6 (ref 5–15)
BUN: 35 mg/dL — ABNORMAL HIGH (ref 8–23)
CO2: 24 mmol/L (ref 22–32)
Calcium: 8.9 mg/dL (ref 8.9–10.3)
Chloride: 113 mmol/L — ABNORMAL HIGH (ref 98–111)
Creatinine, Ser: 0.68 mg/dL (ref 0.44–1.00)
GFR, Estimated: >60 mL/min (ref >60)
Glucose, Bld: 129 mg/dL — ABNORMAL HIGH (ref 70–99)
Potassium: 4.4 mmol/L (ref 3.5–5.1)
Sodium: 143 mmol/L (ref 135–145)

## 2023-04-18 LAB — PHOSPHORUS: Phosphorus: 3.4 mg/dL (ref 2.5–4.6)

## 2023-04-18 LAB — GLUCOSE, CAPILLARY
Glucose-Capillary: 101 mg/dL — ABNORMAL HIGH (ref 70–99)
Glucose-Capillary: 107 mg/dL — ABNORMAL HIGH (ref 70–99)
Glucose-Capillary: 107 mg/dL — ABNORMAL HIGH (ref 70–99)
Glucose-Capillary: 120 mg/dL — ABNORMAL HIGH (ref 70–99)

## 2023-04-18 LAB — MAGNESIUM: Magnesium: 1.9 mg/dL (ref 1.7–2.4)

## 2023-04-18 MED ORDER — TRAVASOL 10 % IV SOLN
INTRAVENOUS | Status: AC
  Filled 2023-04-18: qty 1094.4

## 2023-04-18 NOTE — Progress Notes (Signed)
PROGRESS NOTE    Nicole Oconnor  WJX:[id number] DOB: 1946/03/02 DOA: 04/09/2023 PCP: Michaelle Birks [facility]   Brief Narrative: 77 year old past medical history significant for pheochromocytoma status post left adrenalectomy 10/2021, solitary right kidney, history of SBO status post exploratory laparotomy and lysis of adhesions 05/2021, hypertension presents with abdominal pain, nausea vomiting for the last 3 days prior to admission.  Unable to tolerate oral intakes.  Further evaluation she was found to have SBO, she failed to improve with conservative management.  She underwent exploratory laparotomy with lysis of adhesions on 8/20.   Assessment & Plan:   Principal Problem:   SBO (small bowel obstruction) (HCC) Active Problems:   Essential hypertension   Gout   Edema   H/O total adrenalectomy (HCC)   AKI (acute kidney injury) (HCC)   Protein-calorie malnutrition, severe   1-SBO: -Multiple prior abdominal surgery, prior history of SBO with history of exploratory laparotomy and lysis of adhesions 05/2021. -Presented with abdominal pain, constipation, had NG tube, she failed to improve with conservative management. -Underwent exploratory laparotomy and lysis of adhesions 8/20 -On TPN since 8/19. -Surgery following and managing -She has been passing gas. NGT to be removed.  Continue with clears.   2-Hypertension: Blood pressure medication on hold due to soft-low blood pressure  3-AKI presented with a creatinine of 2.5, prior creatinine 0.7 Improved with hydration  4-Hyponatremia: Corrected with IV fluids.  Hypokalemia Replace as needed Obesity BMI 30 need lifestyle modification  Cough; Report cough. Chest x ray with atelectasis.  Incentive spirometry, Flutter valve.  Started  Guaifenesin.  Hypermagnesemia; in setting of AKI. Resolved.       Nutrition Problem: Severe Malnutrition Etiology: acute illness (SBO)    Signs/Symptoms: moderate muscle depletion, energy  intake < or equal to 50% for > or equal to 5 days    Interventions: Refer to RD note for recommendations, TPN  Estimated body mass index is 33.66 kg/m as calculated from the following:   Height as of this encounter: 5\' 3"  (1.6 m).   Weight as of this encounter: 86.2 kg.   DVT prophylaxis: Heparin Code Status: Full code Family Communication: Daughter who was at bedside 8/21 Disposition Plan:  Status is: Inpatient Remains inpatient appropriate because: Management of SBO    Consultants:  General Sx  Procedures:  Lysis of adhesion.   Antimicrobials:    Subjective: Sitting recliner, still has NG tube. Plan to removed today   Objective: Vitals:   04/17/23 1741 04/17/23 2029 04/18/23 0614 04/18/23 1147  BP: 116/82 127/67 112/70 106/70  Pulse: 94 (!) 102 88 91  Resp: 16 18 18 18   Temp: 97.8 F (36.6 C) 98.7 F (37.1 C) 98.4 F (36.9 C) 97.8 F (36.6 C)  TempSrc: Oral Oral Oral Oral  SpO2: 100% 98% 99% 99%  Weight:      Height:        Intake/Output Summary (Last 24 hours) at 04/18/2023 1256 Last data filed at 04/18/2023 1151 Gross per 24 hour  Intake 2617.18 ml  Output 1550 ml  Net 1067.18 ml   Filed Weights   04/15/23 0500 04/16/23 0500 04/17/23 0500  Weight: 81.4 kg 81 kg 86.2 kg    Examination:  General exam: NAD Respiratory system: CTA Cardiovascular system: S 1, S 2 RRR Gastrointestinal system:  midline wound with honeycomb dressing. Soft, mild tender.      Data Reviewed: I have personally reviewed following labs and imaging studies  CBC: Recent Labs  Lab 04/13/23 0405  04/14/23 0328 04/15/23 0456 04/16/23 0411 04/17/23 0828  WBC 13.6* 16.0* 13.0* 12.8* 11.1*  HGB 12.2 12.0 9.5* 9.5* 9.5*  HCT 39.6 38.4 30.8* 30.8* 31.1*  MCV 88.8 88.3 88.8 90.1 92.6  PLT 220 213 174 188 219   Basic Metabolic Panel: Recent Labs  Lab 04/13/23 0404 04/13/23 0405 04/14/23 0328 04/15/23 0456 04/16/23 0411 04/17/23 0245 04/18/23 0234  NA  --    < >  142 144 144 142 143  K  --    < > 3.8 3.8 3.5 3.8 4.4  CL  --    < > 93* 97* 104 107 113*  CO2  --    < > 38* 37* 32 28 24  GLUCOSE  --    < > 183* 150* 131* 124* 129*  BUN  --    < > 78* 57* 37* 35* 35*  CREATININE  --    < > 1.32* 1.07* 0.79 0.71 0.68  CALCIUM  --    < > 9.1 8.7* 8.9 8.7* 8.9  MG 3.4*  --  3.1* 2.6* 2.3  --  1.9  PHOS  --    < > 2.5 3.7 2.4* 2.9 3.4   < > = values in this interval not displayed.   GFR: Estimated Creatinine Clearance: 60.3 mL/min (by C-G formula based on SCr of 0.68 mg/dL). Liver Function Tests: Recent Labs  Lab 04/13/23 0405 04/14/23 0328 04/15/23 0456 04/16/23 0411  AST  --  26 40 29  ALT  --  11 16 29   ALKPHOS  --  80 77 79  BILITOT  --  1.5* 1.2 0.9  PROT  --  7.3 6.4* 6.4*  ALBUMIN 3.7 3.3* 2.9* 2.7*   No results for input(s): "LIPASE", "AMYLASE" in the last 168 hours.  No results for input(s): "AMMONIA" in the last 168 hours. Coagulation Profile: No results for input(s): "INR", "PROTIME" in the last 168 hours. Cardiac Enzymes: No results for input(s): "CKTOTAL", "CKMB", "CKMBINDEX", "TROPONINI" in the last 168 hours. BNP (last 3 results) No results for input(s): "PROBNP" in the last 8760 hours. HbA1C: No results for input(s): "HGBA1C" in the last 72 hours. CBG: Recent Labs  Lab 04/17/23 1147 04/17/23 1754 04/18/23 0051 04/18/23 0611 04/18/23 1153  GLUCAP 103* 95 101* 120* 107*   Lipid Profile: No results for input(s): "CHOL", "HDL", "LDLCALC", "TRIG", "CHOLHDL", "LDLDIRECT" in the last 72 hours.  Thyroid Function Tests: No results for input(s): "TSH", "T4TOTAL", "FREET4", "T3FREE", "THYROIDAB" in the last 72 hours. Anemia Panel: No results for input(s): "VITAMINB12", "FOLATE", "FERRITIN", "TIBC", "IRON", "RETICCTPCT" in the last 72 hours. Sepsis Labs: Recent Labs  Lab 04/14/23 0351  PROCALCITON 0.61    Recent Results (from the past 240 hour(s))  SARS Coronavirus 2 by RT PCR (hospital order, performed in [facility] hospital lab) *cepheid single result test* Anterior Nasal Swab     Status: None   Collection Time: 04/09/23  2:47 PM   Specimen: Anterior Nasal Swab  Result Value Ref Range Status   SARS Coronavirus 2 by RT PCR NEGATIVE NEGATIVE Final    Comment: (NOTE) SARS-CoV-2 target nucleic acids are NOT DETECTED.  The SARS-CoV-2 RNA is generally detectable in upper and lower respiratory specimens during the acute phase of infection. The lowest concentration of SARS-CoV-2 viral copies this assay can detect is 250 copies / mL. A negative result does not preclude SARS-CoV-2 infection and should not be used as the sole basis for treatment or other patient management decisions.  A negative result may occur with improper specimen collection / handling, submission of specimen other than nasopharyngeal swab, presence of viral mutation(s) within the areas targeted by this assay, and inadequate number of viral copies (<250 copies / mL). A negative result must be combined with clinical observations, patient history, and epidemiological information.  Fact Sheet for Patients:   RoadLapTop.co.za  Fact Sheet for Healthcare Providers: http://kim-miller.com/  This test is not yet approved or  cleared by the [place] FDA and has been authorized for detection and/or diagnosis of SARS-CoV-2 by FDA under an Emergency Use Authorization (EUA).  This EUA will remain in effect (meaning this test can be used) for the duration of the COVID-19 declaration under Section 564(b)(1) of the Act, 21 U.S.C. section 360bbb-3(b)(1), unless the authorization is terminated or revoked sooner.  Performed at [facility], [address], [place], [place] [address]          Radiology Studies: No results found.      Scheduled Meds:  Chlorhexidine Gluconate Cloth  6 each Topical Daily   guaiFENesin  1,200 mg Oral BID   heparin  5,000 Units Subcutaneous  Q8H   insulin aspart  0-9 Units Subcutaneous Q6H   pantoprazole (PROTONIX) IV  40 mg Intravenous Q12H   sodium chloride flush  10-40 mL Intracatheter Q12H   thiamine (VITAMIN B1) injection  100 mg Intravenous Q24H   Continuous Infusions:  sodium chloride 10 mL/hr at 04/16/23 1737   methocarbamol (ROBAXIN) IV 500 mg (04/18/23 0540)   ondansetron (ZOFRAN) IV     TPN ADULT (ION) 80 mL/hr at 04/17/23 1751   TPN ADULT (ION)       LOS: 9 days    Time spent: 35 minutes    Velvie Thomaston A Otilia Kareem, MD [facility]   If 7PM-7AM, please contact night-coverage [email or web address]  04/18/2023, 12:56 PM

## 2023-04-18 NOTE — Progress Notes (Signed)
4 Days Post-Op   Subjective/Chief Complaint: Passing more flatus  Still non BM Denies pain   Objective: Vital signs in last 24 hours: Temp:  [97.8 F (36.6 C)-98.7 F (37.1 C)] 98.4 F (36.9 C) (08/24 2130) Pulse Rate:  [85-102] 88 (08/24 0614) Resp:  [16-18] 18 (08/24 0614) BP: (112-127)/(67-82) 112/70 (08/24 0614) SpO2:  [97 %-100 %] 99 % (08/24 0614) Last BM Date : 04/08/23  Intake/Output from previous day: 08/23 0701 - 08/24 0700 In: 2497.2 [P.O.:390; I.V.:1677.5; IV Piggyback:429.7] Out: 1800 [Urine:1200; Emesis/NG output:600] Intake/Output this shift: No intake/output data recorded.  Exam: Awake and alert Abdomen soft, non-distended, dressing dry  Lab Results:  Recent Labs    04/16/23 0411 04/17/23 0828  WBC 12.8* 11.1*  HGB 9.5* 9.5*  HCT 30.8* 31.1*  PLT 188 219   BMET Recent Labs    04/17/23 0245 04/18/23 0234  NA 142 143  K 3.8 4.4  CL 107 113*  CO2 28 24  GLUCOSE 124* 129*  BUN 35* 35*  CREATININE 0.71 0.68  CALCIUM 8.7* 8.9   PT/INR No results for input(s): "LABPROT", "INR" in the last 72 hours. ABG No results for input(s): "PHART", "HCO3" in the last 72 hours.  Invalid input(s): "PCO2", "PO2"  Studies/Results: DG CHEST PORT 1 VIEW  Result Date: 04/16/2023 CLINICAL DATA:  Cough. EXAM: PORTABLE CHEST 1 VIEW COMPARISON:  February 07, 2005. FINDINGS: Stable cardiomediastinal silhouette. Nasogastric tube is seen entering stomach. Right-sided PICC line is noted. Hypoinflation of the lungs is noted. Right lung is clear. Minimal left basilar subsegmental atelectasis is noted. Bony thorax is unremarkable. IMPRESSION: Hypoinflation of the lungs with minimal left basilar subsegmental atelectasis. Electronically Signed   By: Lupita Raider M.D.   On: 04/16/2023 13:37    Anti-infectives: Anti-infectives (From admission, onward)    Start     Dose/Rate Route Frequency Ordered Stop   04/14/23 1100  cefoTEtan (CEFOTAN) 2 g in sodium chloride 0.9 % 100  mL IVPB        2 g 200 mL/hr over 30 Minutes Intravenous On call to O.R. 04/14/23 0845 04/14/23 1330       Assessment/Plan: POD #4, s/p ex lap with LOA by Dr. Corliss Skains for SBO on 8/20   -d/c NG -keep on liquids -ambulate -continue TPN  Abigail Miyamoto MD 04/18/2023

## 2023-04-18 NOTE — Progress Notes (Signed)
Mobility Specialist - Progress Note   04/18/23 1229  Mobility  Activity Ambulated with assistance in hallway;Transferred to/from [facility]  Level of Assistance Standby assist, set-up cues, supervision of patient - no hands on  Assistive Device Front wheel walker  Distance Ambulated (ft) 80 ft  Activity Response Tolerated well  Mobility Referral Yes  $Mobility charge 1 Mobility  Mobility Specialist Start Time (ACUTE ONLY) 1150  Mobility Specialist Stop Time (ACUTE ONLY) 1229  Mobility Specialist Time Calculation (min) (ACUTE ONLY) 39 min   Pt received in recliner and agreeable to mobility. Prior to ambulating pt requested assistance to [facility]. No complaints during session. Pt to bed after session with all needs met. Bed alarm on.   [facility]

## 2023-04-18 NOTE — Progress Notes (Signed)
PHARMACY - TOTAL PARENTERAL NUTRITION CONSULT NOTE   Indication: Prolonged ileus  Patient Measurements: Height: 5\' 3"  (160 cm) Weight: 86.2 kg (190 lb 0.6 oz) IBW/kg (Calculated) : 52.4 TPN AdjBW (KG): 59 Body mass index is 33.66 kg/m.   Assessment:  Patient is a 77 year old female with pheochromocytoma s/p left adrenalectomy in 10/2021, solitary right kidney, history of SBO s/p ex lap and lysis of additions in 05/2021, HTN who  presented with abdominal pain, nausea and vomiting.  Patient reported last BM on 8/13, since then persistent nausea and vomiting, unable to maintain p.o. intake.  Admitted for SBO, which is not responding to conservative treatment.  Surgery recommending exploratory laparotomy with lysis of adhesions (done 04/14/23). Pharmacy consulted for TPN.   Glucose / Insulin: CBGs well controlled (goal 95-131) - 1 units SSI last 24h  Electrolytes: WNL, Cl slightly elevated  Renal: AKI resolved; BUN still elevated but trending down ; UOP  improving  Hepatic:  LFTs stable WNL; Tbili decreased to WNL; albumin remains low - TG WNL (8/20)  Intake / Output; MIVF:  - IVF: 1/2 NS at kvo - NG: 600>>clamped>>removed 8/24 - PO: 390 - UOP 1200  - LBM 8/14, +flatus  GI Imaging: - 8/15 CTa/p: Developing or partial obstruction - 8/16, 8/18, 8/19 AXR: all show stable (possibly partial) SBO  GI Surgeries / Procedures:  - 8/20: Exploratory laparotomy, extensive lysis of adhesions   Central access: PICC TPN start date: 8/19  Nutritional Goals: Goal TPN rate is 80 mL/hr (provides 109 g of protein and 1992 kcals per day)  RD Assessment: Estimated Needs Total Energy Estimated Needs: 1800-2000 kcals Total Protein Estimated Needs: 90-110 grams Total Fluid Estimated Needs: >/= 1.8L  Current Nutrition:  NPO TPN  Plan:  D/c NG Con't CLD Add IV thiamine 100mg  daily  No changes today  TPN at goal rate of 80 ml/hr today Electrolytes in TPN: increase K Na 50 mEq/L K 50  mEq/L Ca 5 mEq/L  Mg 0 mEq/L Phos 15 mmol/L Cl:Ac 1:1 Add standard MVI and trace elements to TPN Thiamine 100 mg x 5 days Continue sensitive q6h SSI and adjust as needed  1/2 NS to [facility] Monitor TPN labs on Mon/Thurs and prn  Rilley Stash S. Merilynn Finland, PharmD, BCPS Clinical Staff Pharmacist [email or web address] 04/18/2023, 8:53 AM

## 2023-04-18 NOTE — Plan of Care (Signed)
  Problem: Clinical Measurements: Goal: Diagnostic test results will improve Outcome: Progressing   Problem: Activity: Goal: Risk for activity intolerance will decrease Outcome: Progressing   Problem: Nutrition: Goal: Adequate nutrition will be maintained Outcome: Progressing   Problem: Coping: Goal: Level of anxiety will decrease Outcome: Not Progressing

## 2023-04-19 DIAGNOSIS — K56609 Unspecified intestinal obstruction, unspecified as to partial versus complete obstruction: Secondary | ICD-10-CM | POA: Diagnosis not present

## 2023-04-19 LAB — CBC
HCT: 29.1 % — ABNORMAL LOW (ref 36.0–46.0)
Hemoglobin: 9 g/dL — ABNORMAL LOW (ref 12.0–15.0)
MCH: 28.6 pg (ref 26.0–34.0)
MCHC: 30.9 g/dL (ref 30.0–36.0)
MCV: 92.4 fL (ref 80.0–100.0)
Platelets: 221 10*3/uL (ref 150–400)
RBC: 3.15 MIL/uL — ABNORMAL LOW (ref 3.87–5.11)
RDW: 15.4 % (ref 11.5–15.5)
WBC: 13.6 10*3/uL — ABNORMAL HIGH (ref 4.0–10.5)
nRBC: 0 % (ref 0.0–0.2)

## 2023-04-19 LAB — BASIC METABOLIC PANEL
Anion gap: 5 (ref 5–15)
BUN: 38 mg/dL — ABNORMAL HIGH (ref 8–23)
CO2: 20 mmol/L — ABNORMAL LOW (ref 22–32)
Calcium: 8.9 mg/dL (ref 8.9–10.3)
Chloride: 115 mmol/L — ABNORMAL HIGH (ref 98–111)
Creatinine, Ser: 0.66 mg/dL (ref 0.44–1.00)
GFR, Estimated: >60 mL/min (ref >60)
Glucose, Bld: 124 mg/dL — ABNORMAL HIGH (ref 70–99)
Potassium: 4.6 mmol/L (ref 3.5–5.1)
Sodium: 140 mmol/L (ref 135–145)

## 2023-04-19 LAB — GLUCOSE, CAPILLARY
Glucose-Capillary: 118 mg/dL — ABNORMAL HIGH (ref 70–99)
Glucose-Capillary: 124 mg/dL — ABNORMAL HIGH (ref 70–99)

## 2023-04-19 MED ORDER — TRAVASOL 10 % IV SOLN
INTRAVENOUS | Status: AC
  Filled 2023-04-19: qty 1094.4

## 2023-04-19 NOTE — Progress Notes (Signed)
PROGRESS NOTE    Nicole Oconnor  ZOX:[id number] DOB: 25-Sep-1945 DOA: 04/09/2023 PCP: Michaelle Birks [facility]   Brief Narrative: 77 year old past medical history significant for pheochromocytoma status post left adrenalectomy 10/2021, solitary right kidney, history of SBO status post exploratory laparotomy and lysis of adhesions 05/2021, hypertension presents with abdominal pain, nausea vomiting for the last 3 days prior to admission.  Unable to tolerate oral intakes.  Further evaluation she was found to have SBO, she failed to improve with conservative management.  She underwent exploratory laparotomy with lysis of adhesions on 8/20.   Assessment & Plan:   Principal Problem:   SBO (small bowel obstruction) (HCC) Active Problems:   Essential hypertension   Gout   Edema   H/O total adrenalectomy (HCC)   AKI (acute kidney injury) (HCC)   Protein-calorie malnutrition, severe   1-SBO: -Multiple prior abdominal surgery, prior history of SBO with history of exploratory laparotomy and lysis of adhesions 05/2021. -Presented with abdominal pain, constipation, had NG tube, she failed to improve with conservative management. -Underwent exploratory laparotomy and lysis of adhesions 8/20 -On TPN since 8/19. -Surgery following and managing -She has been passing gas. NGT to be removed.  Continue with clears. Had BM this afternoon. Denies worsening pain.  Leukocytosis. Repeat labs in am.   2-Hypertension: Blood pressure medication on hold due to soft-low blood pressure  3-AKI presented with a creatinine of 2.5, prior creatinine 0.7 Improved with hydration  4-Hyponatremia: Corrected with IV fluids.  Hypokalemia Replace as needed Obesity BMI 30 need lifestyle modification  Cough; Report cough. Chest x ray with atelectasis.  Incentive spirometry, Flutter valve.  Started  Guaifenesin.  Report improved cough.   Hypermagnesemia; in setting of AKI. Resolved.       Nutrition Problem:  Severe Malnutrition Etiology: acute illness (SBO)    Signs/Symptoms: moderate muscle depletion, energy intake < or equal to 50% for > or equal to 5 days    Interventions: Refer to RD note for recommendations, TPN  Estimated body mass index is 33.82 kg/m as calculated from the following:   Height as of this encounter: 5\' 3"  (1.6 m).   Weight as of this encounter: 86.6 kg.   DVT prophylaxis: Heparin Code Status: Full code Family Communication: Son at bedside 8/25 Disposition Plan:  Status is: Inpatient Remains inpatient appropriate because: Management of SBO    Consultants:  General Sx  Procedures:  Lysis of adhesion.   Antimicrobials:    Subjective: She had BM this afternoon. Denies worsening abdominal pain. Cough improved.   Objective: Vitals:   04/18/23 1147 04/18/23 2235 04/19/23 0553 04/19/23 1317  BP: 106/70 95/62 92/65  103/65  Pulse: 91 86 89 83  Resp: 18 18 18 16   Temp: 97.8 F (36.6 C) 98 F (36.7 C) 98.2 F (36.8 C) 98.3 F (36.8 C)  TempSrc: Oral Oral Oral Oral  SpO2: 99% 99% 99% 100%  Weight:   86.6 kg   Height:        Intake/Output Summary (Last 24 hours) at 04/19/2023 1430 Last data filed at 04/19/2023 1007 Gross per 24 hour  Intake 2386.99 ml  Output 300 ml  Net 2086.99 ml   Filed Weights   04/16/23 0500 04/17/23 0500 04/19/23 0553  Weight: 81 kg 86.2 kg 86.6 kg    Examination:  General exam: NAD Respiratory system: CTA Cardiovascular system: S 1, S 2 RRR Gastrointestinal system:  midline wound with honeycomb dressing. Soft, mild tender.      Data Reviewed:  I have personally reviewed following labs and imaging studies  CBC: Recent Labs  Lab 04/14/23 0328 04/15/23 0456 04/16/23 0411 04/17/23 0828 04/19/23 0317  WBC 16.0* 13.0* 12.8* 11.1* 13.6*  HGB 12.0 9.5* 9.5* 9.5* 9.0*  HCT 38.4 30.8* 30.8* 31.1* 29.1*  MCV 88.3 88.8 90.1 92.6 92.4  PLT 213 174 188 219 221   Basic Metabolic Panel: Recent Labs  Lab  04/13/23 0404 04/13/23 0405 04/14/23 0328 04/15/23 0456 04/16/23 0411 04/17/23 0245 04/18/23 0234 04/19/23 0317  NA  --    < > 142 144 144 142 143 140  K  --    < > 3.8 3.8 3.5 3.8 4.4 4.6  CL  --    < > 93* 97* 104 107 113* 115*  CO2  --    < > 38* 37* 32 28 24 20*  GLUCOSE  --    < > 183* 150* 131* 124* 129* 124*  BUN  --    < > 78* 57* 37* 35* 35* 38*  CREATININE  --    < > 1.32* 1.07* 0.79 0.71 0.68 0.66  CALCIUM  --    < > 9.1 8.7* 8.9 8.7* 8.9 8.9  MG 3.4*  --  3.1* 2.6* 2.3  --  1.9  --   PHOS  --    < > 2.5 3.7 2.4* 2.9 3.4  --    < > = values in this interval not displayed.   GFR: Estimated Creatinine Clearance: 60.5 mL/min (by C-G formula based on SCr of 0.66 mg/dL). Liver Function Tests: Recent Labs  Lab 04/13/23 0405 04/14/23 0328 04/15/23 0456 04/16/23 0411  AST  --  26 40 29  ALT  --  11 16 29   ALKPHOS  --  80 77 79  BILITOT  --  1.5* 1.2 0.9  PROT  --  7.3 6.4* 6.4*  ALBUMIN 3.7 3.3* 2.9* 2.7*   No results for input(s): "LIPASE", "AMYLASE" in the last 168 hours.  No results for input(s): "AMMONIA" in the last 168 hours. Coagulation Profile: No results for input(s): "INR", "PROTIME" in the last 168 hours. Cardiac Enzymes: No results for input(s): "CKTOTAL", "CKMB", "CKMBINDEX", "TROPONINI" in the last 168 hours. BNP (last 3 results) No results for input(s): "PROBNP" in the last 8760 hours. HbA1C: No results for input(s): "HGBA1C" in the last 72 hours. CBG: Recent Labs  Lab 04/18/23 0611 04/18/23 1153 04/18/23 1740 04/19/23 0011 04/19/23 0549  GLUCAP 120* 107* 107* 124* 118*   Lipid Profile: No results for input(s): "CHOL", "HDL", "LDLCALC", "TRIG", "CHOLHDL", "LDLDIRECT" in the last 72 hours.  Thyroid Function Tests: No results for input(s): "TSH", "T4TOTAL", "FREET4", "T3FREE", "THYROIDAB" in the last 72 hours. Anemia Panel: No results for input(s): "VITAMINB12", "FOLATE", "FERRITIN", "TIBC", "IRON", "RETICCTPCT" in the last 72  hours. Sepsis Labs: Recent Labs  Lab 04/14/23 0351  PROCALCITON 0.61    Recent Results (from the past 240 hour(s))  SARS Coronavirus 2 by RT PCR (hospital order, performed in [facility] hospital lab) *cepheid single result test* Anterior Nasal Swab     Status: None   Collection Time: 04/09/23  2:47 PM   Specimen: Anterior Nasal Swab  Result Value Ref Range Status   SARS Coronavirus 2 by RT PCR NEGATIVE NEGATIVE Final    Comment: (NOTE) SARS-CoV-2 target nucleic acids are NOT DETECTED.  The SARS-CoV-2 RNA is generally detectable in upper and lower respiratory specimens during the acute phase of infection. The lowest concentration of SARS-CoV-2 viral copies  this assay can detect is 250 copies / mL. A negative result does not preclude SARS-CoV-2 infection and should not be used as the sole basis for treatment or other patient management decisions.  A negative result may occur with improper specimen collection / handling, submission of specimen other than nasopharyngeal swab, presence of viral mutation(s) within the areas targeted by this assay, and inadequate number of viral copies (<250 copies / mL). A negative result must be combined with clinical observations, patient history, and epidemiological information.  Fact Sheet for Patients:   RoadLapTop.co.za  Fact Sheet for Healthcare Providers: http://kim-miller.com/  This test is not yet approved or  cleared by the [place] FDA and has been authorized for detection and/or diagnosis of SARS-CoV-2 by FDA under an Emergency Use Authorization (EUA).  This EUA will remain in effect (meaning this test can be used) for the duration of the COVID-19 declaration under Section 564(b)(1) of the Act, 21 U.S.C. section 360bbb-3(b)(1), unless the authorization is terminated or revoked sooner.  Performed at [facility], [address] [place], [place] [address]           Radiology Studies: No results found.      Scheduled Meds:  Chlorhexidine Gluconate Cloth  6 each Topical Daily   guaiFENesin  1,200 mg Oral BID   heparin  5,000 Units Subcutaneous Q8H   pantoprazole (PROTONIX) IV  40 mg Intravenous Q12H   sodium chloride flush  10-40 mL Intracatheter Q12H   Continuous Infusions:  sodium chloride 10 mL/hr at 04/16/23 1737   methocarbamol (ROBAXIN) IV 500 mg (04/19/23 0558)   ondansetron (ZOFRAN) IV     TPN ADULT (ION) 80 mL/hr at 04/18/23 1730   TPN ADULT (ION)       LOS: 10 days    Time spent: 35 minutes    Aldine Grainger A Ora Mcnatt, MD [facility]   If 7PM-7AM, please contact night-coverage [email or web address]  04/19/2023, 2:30 PM

## 2023-04-19 NOTE — Progress Notes (Signed)
PHARMACY - TOTAL PARENTERAL NUTRITION CONSULT NOTE   Indication: Prolonged ileus  Patient Measurements: Height: 5\' 3"  (160 cm) Weight: 86.6 kg (190 lb 14.7 oz) IBW/kg (Calculated) : 52.4 TPN AdjBW (KG): 59 Body mass index is 33.82 kg/m.   Assessment:  Patient is a 77 year old female with pheochromocytoma s/p left adrenalectomy in 10/2021, solitary right kidney, history of SBO s/p ex lap and lysis of additions in 05/2021, HTN who  presented with abdominal pain, nausea and vomiting.  Patient reported last BM on 8/13, since then persistent nausea and vomiting, unable to maintain p.o. intake.  Admitted for SBO, which is not responding to conservative treatment.  Surgery recommending exploratory laparotomy with lysis of adhesions (done 04/14/23). Pharmacy consulted for TPN.   Glucose / Insulin: CBGs well controlled (goal 107-124) - 1 units SSI last 24h. Will discontinue as very well controlled  Electrolytes: WNL, Cl elevated, Bicarb low  Renal: AKI resolved; BUN still elevated but trending down  Hepatic:  LFTs stable WNL; Tbili decreased to WNL; albumin remains low - TG WNL (8/20)  Intake / Output; MIVF:  - IVF: 1/2 NS at kvo - NG: 250>>clamped>>removed 8/24 - PO: 420 - UOP unmeasured - LBM 8/14, +flatus  GI Imaging: - 8/15 CTa/p: Developing or partial obstruction - 8/16, 8/18, 8/19 AXR: all show stable (possibly partial) SBO  GI Surgeries / Procedures:  - 8/20: Exploratory laparotomy, extensive lysis of adhesions   Central access: PICC TPN start date: 8/19  Nutritional Goals: Goal TPN rate is 80 mL/hr (provides 109 g of protein and 1992 kcals per day)  RD Assessment: Estimated Needs Total Energy Estimated Needs: 1800-2000 kcals Total Protein Estimated Needs: 90-110 grams Total Fluid Estimated Needs: >/= 1.8L  Current Nutrition:  NPO TPN  Plan:  Con't CLD May need a CT of the abdomen/pelvis to r/o abscess   TPN at goal rate of 80 ml/hr today Electrolytes in TPN:  increase K Na 50 mEq/L K 50 mEq/L Ca 5 mEq/L  Mg 0 mEq/L Phos 15 mmol/L Cl:Ac Max acetate Add standard MVI and trace elements to TPN Thiamine 100 mg x 5 days D/c sensitive q6h SSI and adjust as needed  1/2 NS to [facility] Monitor TPN labs on Mon/Thurs and prn  Natayah Warmack S. Merilynn Finland, PharmD, BCPS Clinical Staff Pharmacist [email or web address] 04/19/2023, 10:01 AM

## 2023-04-19 NOTE — Plan of Care (Signed)

## 2023-04-19 NOTE — Progress Notes (Signed)
5 Days Post-Op   Subjective/Chief Complaint: Tolerated NG out Passing flatus but no BM Still with some cramping pain   Objective: Vital signs in last 24 hours: Temp:  [97.8 F (36.6 C)-98.2 F (36.8 C)] 98.2 F (36.8 C) (08/25 0553) Pulse Rate:  [86-91] 89 (08/25 0553) Resp:  [18] 18 (08/25 0553) BP: (92-106)/(62-70) 92/65 (08/25 0553) SpO2:  [99 %] 99 % (08/25 0553) Weight:  [86.6 kg] 86.6 kg (08/25 0553) Last BM Date : 04/08/23  Intake/Output from previous day: 08/24 0701 - 08/25 0700 In: 2507 [P.O.:420; I.V.:1877; IV Piggyback:210] Out: 250 [Emesis/NG output:250] Intake/Output this shift: No intake/output data recorded.  Exam: Awake and alert Abdomen a little full but soft, minimally tender  Lab Results:  Recent Labs    04/17/23 0828 04/19/23 0317  WBC 11.1* 13.6*  HGB 9.5* 9.0*  HCT 31.1* 29.1*  PLT 219 221   BMET Recent Labs    04/18/23 0234 04/19/23 0317  NA 143 140  K 4.4 4.6  CL 113* 115*  CO2 24 20*  GLUCOSE 129* 124*  BUN 35* 38*  CREATININE 0.68 0.66  CALCIUM 8.9 8.9   PT/INR No results for input(s): "LABPROT", "INR" in the last 72 hours. ABG No results for input(s): "PHART", "HCO3" in the last 72 hours.  Invalid input(s): "PCO2", "PO2"  Studies/Results: No results found.  Anti-infectives: Anti-infectives (From admission, onward)    Start     Dose/Rate Route Frequency Ordered Stop   04/14/23 1100  cefoTEtan (CEFOTAN) 2 g in sodium chloride 0.9 % 100 mL IVPB        2 g 200 mL/hr over 30 Minutes Intravenous On call to O.R. 04/14/23 0845 04/14/23 1330       Assessment/Plan: POD#5 s/p expl lap and LOA by MT for SBO on 8/20  WBC up.  If up further tomorrow, may need a CT of the abdomen/pelvis to r/o abscess  Keep on liquids Continue TNA   Abigail Miyamoto MD 04/19/2023

## 2023-04-20 DIAGNOSIS — K56609 Unspecified intestinal obstruction, unspecified as to partial versus complete obstruction: Secondary | ICD-10-CM | POA: Diagnosis not present

## 2023-04-20 LAB — COMPREHENSIVE METABOLIC PANEL
ALT: 90 U/L — ABNORMAL HIGH (ref 0–44)
AST: 52 U/L — ABNORMAL HIGH (ref 15–41)
Albumin: 2.5 g/dL — ABNORMAL LOW (ref 3.5–5.0)
Alkaline Phosphatase: 170 U/L — ABNORMAL HIGH (ref 38–126)
Anion gap: 5 (ref 5–15)
BUN: 38 mg/dL — ABNORMAL HIGH (ref 8–23)
CO2: 21 mmol/L — ABNORMAL LOW (ref 22–32)
Calcium: 8.6 mg/dL — ABNORMAL LOW (ref 8.9–10.3)
Chloride: 112 mmol/L — ABNORMAL HIGH (ref 98–111)
Creatinine, Ser: 0.62 mg/dL (ref 0.44–1.00)
GFR, Estimated: >60 mL/min (ref >60)
Glucose, Bld: 125 mg/dL — ABNORMAL HIGH (ref 70–99)
Potassium: 4.5 mmol/L (ref 3.5–5.1)
Sodium: 138 mmol/L (ref 135–145)
Total Bilirubin: 0.6 mg/dL (ref 0.3–1.2)
Total Protein: 6 g/dL — ABNORMAL LOW (ref 6.5–8.1)

## 2023-04-20 LAB — CBC
HCT: 26.1 % — ABNORMAL LOW (ref 36.0–46.0)
Hemoglobin: 7.9 g/dL — ABNORMAL LOW (ref 12.0–15.0)
MCH: 28 pg (ref 26.0–34.0)
MCHC: 30.3 g/dL (ref 30.0–36.0)
MCV: 92.6 fL (ref 80.0–100.0)
Platelets: 220 10*3/uL (ref 150–400)
RBC: 2.82 MIL/uL — ABNORMAL LOW (ref 3.87–5.11)
RDW: 15.6 % — ABNORMAL HIGH (ref 11.5–15.5)
WBC: 12.6 10*3/uL — ABNORMAL HIGH (ref 4.0–10.5)
nRBC: 0 % (ref 0.0–0.2)

## 2023-04-20 LAB — PHOSPHORUS: Phosphorus: 3.6 mg/dL (ref 2.5–4.6)

## 2023-04-20 LAB — TRIGLYCERIDES: Triglycerides: 90 mg/dL (ref <150)

## 2023-04-20 LAB — MAGNESIUM: Magnesium: 1.5 mg/dL — ABNORMAL LOW (ref 1.7–2.4)

## 2023-04-20 MED ORDER — ENSURE ENLIVE PO LIQD
237.0000 mL | Freq: Two times a day (BID) | ORAL | Status: DC
  Administered 2023-04-21: 237 mL via ORAL

## 2023-04-20 MED ORDER — MAGNESIUM SULFATE 50 % IJ SOLN
0.5000 g | Freq: Once | INTRAVENOUS | Status: AC
  Administered 2023-04-20: 0.5 g via INTRAVENOUS
  Filled 2023-04-20: qty 1

## 2023-04-20 MED ORDER — TRAVASOL 10 % IV SOLN
INTRAVENOUS | Status: AC
  Filled 2023-04-20: qty 1094.4

## 2023-04-20 MED ORDER — MAGNESIUM SULFATE 2 GM/50ML IV SOLN
2.0000 g | Freq: Once | INTRAVENOUS | Status: AC
  Administered 2023-04-20: 2 g via INTRAVENOUS
  Filled 2023-04-20: qty 50

## 2023-04-20 NOTE — Plan of Care (Signed)
  Problem: Health Behavior/Discharge Planning: Goal: Ability to manage health-related needs will improve Outcome: Progressing   Problem: Clinical Measurements: Goal: Will remain free from infection Outcome: Progressing Goal: Diagnostic test results will improve Outcome: Progressing Goal: Respiratory complications will improve Outcome: Progressing   Problem: Activity: Goal: Risk for activity intolerance will decrease Outcome: Progressing   Problem: Nutrition: Goal: Adequate nutrition will be maintained Outcome: Progressing   Problem: Elimination: Goal: Will not experience complications related to bowel motility Outcome: Progressing   Problem: Safety: Goal: Ability to remain free from injury will improve Outcome: Progressing

## 2023-04-20 NOTE — Progress Notes (Signed)
PHARMACY - TOTAL PARENTERAL NUTRITION CONSULT NOTE   Indication: Prolonged ileus  Patient Measurements: Height: 5\' 3"  (160 cm) Weight: 82.2 kg (181 lb 3.5 oz) IBW/kg (Calculated) : 52.4 TPN AdjBW (KG): 59 Body mass index is 32.1 kg/m.   Assessment:  Patient is a 77 year old female with pheochromocytoma s/p left adrenalectomy in 10/2021, solitary right kidney, history of SBO s/p ex lap and lysis of additions in 05/2021, HTN who  presented with abdominal pain, nausea and vomiting.  Patient reported last BM on 8/13, since then persistent nausea and vomiting, unable to maintain p.o. intake.  Admitted for SBO, which is not responding to conservative treatment.  Surgery recommending exploratory laparotomy with lysis of adhesions (done 04/14/23). Pharmacy consulted for TPN.   Glucose / Insulin: CBGs well controlled (goal 107-124) - 1 units SSI last 24h. Will discontinue as very well controlled  Electrolytes: Mag low at 1.5.  Cl elevated, Bicarb low. All other lytes WNL.  Renal: AKI resolved; BUN still elevated but trending down  Hepatic:  LFTs slightly elevated.  Tbil WNL.  Albumin remains low - TG WNL (8/26)  Intake / Output; MIVF:  - IVF: 1/2 NS at kvo - NG: 250>>clamped>>removed 8/24 From 8/25 7am to 8/26 7am - PO: - Urine: .  5x unmeasured occurrences - BM: 3 x unmeasured stoop occurences  GI Imaging: - 8/15 CTa/p: Developing or partial obstruction - 8/16, 8/18, 8/19 AXR: all show stable (possibly partial) SBO  GI Surgeries / Procedures:  - 8/20: Exploratory laparotomy, extensive lysis of adhesions   Central access: PICC TPN start date: 8/19  Nutritional Goals: Goal TPN rate is 80 mL/hr (provides 109 g of protein and 1897 kcals per day)  RD Assessment: Estimated Needs Total Energy Estimated Needs: 1800-2000 kcals Total Protein Estimated Needs: 90-110 grams Total Fluid Estimated Needs: >/= 1.8L  Current Nutrition:  8/26: Clear Liquid to Regular  Diet Continue TPN Completed Thiamine 100 mg IV daily x 6 days (8/19- 8/24) Plan:  Magnesium 0.5gm IV x1 now (runner outside of TPN).  TPN at goal rate of 80 ml/hr today Electrolytes in TPN:  Na 50 mEq/L K 30 mEq/L Ca 5 mEq/L  Mg 2 mEq/L Phos 15 mmol/L Cl:Ac Max acetate Reduce K due to no longer on NG suction, expect need will be less.  Add Magnesium due to low level.  Decrease Lipids to 25g/L (from 30g/L) due to elevated LFTs.  Clories still meet needs. Add standard MVI and trace elements to TPN Previously discontinued SSI  1/2 NS to [facility] Monitor TPN labs on Mon/Thurs and prn  Tacy Learn, PharmD Clinical Pharmacist 04/20/2023 9:42 AM

## 2023-04-20 NOTE — Progress Notes (Signed)
PROGRESS NOTE    Nicole Oconnor  NGE:[id number] DOB: November 03, 1945 DOA: 04/09/2023 PCP: Michaelle Birks 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  Unable to tolerate oral intakes.  Further evaluation she was found to have SBO, she failed to improve with conservative management.  She underwent exploratory laparotomy with lysis of adhesions on 8/20.   Assessment & Plan:   Principal Problem:   SBO (small bowel obstruction) (HCC) Active Problems:   Essential hypertension   Gout   Edema   H/O total adrenalectomy (HCC)   AKI (acute kidney injury) (HCC)   Protein-calorie malnutrition, severe   1-SBO: -Multiple prior abdominal surgery, prior history of SBO with history of exploratory laparotomy and lysis of adhesions 05/2021. -Presented with abdominal pain, constipation, had NG tube, she failed to improve with conservative management. -Underwent exploratory laparotomy and lysis of adhesions 8/20 -On TPN since 8/19. -Surgery following and managing -NGT to be removed. 8/24. -hd BM. Diet advanced to soft today. WBC trending.   2-Hypertension: Blood pressure medication on hold due to soft-low blood pressure  3-AKI presented with a creatinine of 2.5, prior creatinine 0.7 Improved with hydration  4-Anemia: ? Hemodilution. Monitor for bleeding.  Check anemia panel in am. Repeat CBC in am.   Transaminases; monitor, on TPN.    Hyponatremia: Corrected with IV fluids.  Hypokalemia Replace as needed Obesity BMI 30 need lifestyle modification  Cough; Report cough. Chest x ray with atelectasis.  Incentive spirometry, Flutter valve.  Started  Guaifenesin.  Report improved cough.   Hypermagnesemia;  in setting of AKI. Resolved.  Now Hypomagnesemia; replete IV>      Nutrition Problem: Severe Malnutrition Etiology: acute illness (SBO)    Signs/Symptoms: moderate muscle depletion, energy intake < or equal to 50% for > or equal to 5 days    Interventions: Refer to RD note for recommendations, TPN  Estimated body mass index is 32.1 kg/m as calculated from the following:   Height as of this encounter: 5\' 3"  (1.6 m).   Weight as of this encounter: 82.2 kg.   DVT prophylaxis: Heparin Code Status: Full code Family Communication: Son at bedside 8/25 Disposition Plan:  Status is: Inpatient Remains inpatient appropriate because: Management of SBO    Consultants:  General Sx  Procedures:  Lysis of adhesion.   Antimicrobials:    Subjective: She is feeling better. Cough improved. Denies worsening abdominal pain. Pain improving.  Had another BM today, small.   Objective: Vitals:   04/19/23 1317 04/19/23 2128 04/20/23 0500 04/20/23 0524  BP: 103/65 106/71  100/62  Pulse: 83 84  78  Resp: 16 18  20   Temp: 98.3 F (36.8 C) 98.8 F (37.1 C)  98.6 F (37 C)  TempSrc: Oral Oral  Oral  SpO2: 100% 99%  99%  Weight:   82.2 kg   Height:        Intake/Output Summary (Last 24 hours) at 04/20/2023 1241 Last data filed at 04/20/2023 1000 Gross per 24 hour  Intake 2979.14 ml  Output 150 ml  Net 2829.14 ml   Filed Weights   04/17/23 0500 04/19/23 0553 04/20/23 0500  Weight: 86.2 kg 86.6 kg 82.2 kg    Examination:  General exam: NAD Respiratory system: CTA Cardiovascular system: S 1,  S 2 RRR Gastrointestinal system:  midline wound with honeycomb dressing. Soft, mild tender.      Data Reviewed: I have personally reviewed following labs and imaging studies  CBC: Recent Labs  Lab 04/15/23 0456 04/16/23 0411 04/17/23 0828 04/19/23 0317 04/20/23 0322  WBC 13.0* 12.8* 11.1* 13.6* 12.6*  HGB 9.5* 9.5* 9.5* 9.0* 7.9*  HCT 30.8* 30.8* 31.1* 29.1* 26.1*  MCV  88.8 90.1 92.6 92.4 92.6  PLT 174 188 219 221 220   Basic Metabolic Panel: Recent Labs  Lab 04/14/23 0328 04/15/23 0456 04/16/23 0411 04/17/23 0245 04/18/23 0234 04/19/23 0317 04/20/23 0322  NA 142 144 144 142 143 140 138  K 3.8 3.8 3.5 3.8 4.4 4.6 4.5  CL 93* 97* 104 107 113* 115* 112*  CO2 38* 37* 32 28 24 20* 21*  GLUCOSE 183* 150* 131* 124* 129* 124* 125*  BUN 78* 57* 37* 35* 35* 38* 38*  CREATININE 1.32* 1.07* 0.79 0.71 0.68 0.66 0.62  CALCIUM 9.1 8.7* 8.9 8.7* 8.9 8.9 8.6*  MG 3.1* 2.6* 2.3  --  1.9  --  1.5*  PHOS 2.5 3.7 2.4* 2.9 3.4  --  3.6   GFR: Estimated Creatinine Clearance: 58.8 mL/min (by C-G formula based on SCr of 0.62 mg/dL). Liver Function Tests: Recent Labs  Lab 04/14/23 0328 04/15/23 0456 04/16/23 0411 04/20/23 0322  AST 26 40 29 52*  ALT 11 16 29  90*  ALKPHOS 80 77 79 170*  BILITOT 1.5* 1.2 0.9 0.6  PROT 7.3 6.4* 6.4* 6.0*  ALBUMIN 3.3* 2.9* 2.7* 2.5*   No results for input(s): "LIPASE", "AMYLASE" in the last 168 hours.  No results for input(s): "AMMONIA" in the last 168 hours. Coagulation Profile: No results for input(s): "INR", "PROTIME" in the last 168 hours. Cardiac Enzymes: No results for input(s): "CKTOTAL", "CKMB", "CKMBINDEX", "TROPONINI" in the last 168 hours. BNP (last 3 results) No results for input(s): "PROBNP" in the last 8760 hours. HbA1C: No results for input(s): "HGBA1C" in the last 72 hours. CBG: Recent Labs  Lab 04/18/23 0611 04/18/23 1153 04/18/23 1740 04/19/23 0011 04/19/23 0549  GLUCAP 120* 107* 107* 124* 118*   Lipid Profile: Recent Labs    04/20/23 0322  TRIG 90    Thyroid Function Tests: No results for input(s): "TSH", "T4TOTAL", "FREET4", "T3FREE", "THYROIDAB" in the last 72 hours. Anemia Panel: No results for input(s): "VITAMINB12", "FOLATE", "FERRITIN", "TIBC", "IRON", "RETICCTPCT" in the last 72 hours. Sepsis Labs: Recent Labs  Lab 04/14/23 0351  PROCALCITON 0.61    No results found for  this or any previous visit (from the past 240 hour(s)).        Radiology Studies: No results found.      Scheduled Meds:  Chlorhexidine Gluconate Cloth  6 each Topical Daily   [START ON 04/21/2023] feeding supplement  237 mL Oral BID BM   guaiFENesin  1,200 mg Oral BID   heparin  5,000 Units Subcutaneous Q8H   pantoprazole (PROTONIX) IV  40 mg Intravenous Q12H   sodium chloride flush  10-40 mL Intracatheter Q12H   Continuous Infusions:  sodium chloride 10 mL/hr at 04/16/23 1737   magnesium sulfate bolus IVPB 0.5 g (04/20/23 1206)   methocarbamol (ROBAXIN) IV 500 mg (04/20/23 0535)   ondansetron (ZOFRAN) IV     TPN ADULT (ION) 80 mL/hr at 04/19/23 1733   TPN ADULT (ION)       LOS: 11 days    Time spent: 35 minutes    Lonia Roane A Reniah Cottingham,  MD [facility]   If 7PM-7AM, please contact night-coverage [email or web address]  04/20/2023, 12:41 PM

## 2023-04-20 NOTE — Progress Notes (Signed)
6 Days Post-Op   Subjective/Chief Complaint: Had a BM Having less pain   Objective: Vital signs in last 24 hours: Temp:  [98.3 F (36.8 C)-98.8 F (37.1 C)] 98.6 F (37 C) (08/26 0524) Pulse Rate:  [78-84] 78 (08/26 0524) Resp:  [16-20] 20 (08/26 0524) BP: (100-106)/(62-71) 100/62 (08/26 0524) SpO2:  [99 %-100 %] 99 % (08/26 0524) Weight:  [82.2 kg] 82.2 kg (08/26 0500) Last BM Date : 04/19/23  Intake/Output from previous day: 08/25 0701 - 08/26 0700 In: 2525.4 [P.O.:300; I.V.:2014.8; IV Piggyback:210.6] Out: 300 [Urine:300] Intake/Output this shift: Total I/O In: 60 [P.O.:60] Out: -   Exam: Awake and alert Abdomen a little full but soft, minimally tender  Lab Results:  Recent Labs    04/19/23 0317 04/20/23 0322  WBC 13.6* 12.6*  HGB 9.0* 7.9*  HCT 29.1* 26.1*  PLT 221 220   BMET Recent Labs    04/19/23 0317 04/20/23 0322  NA 140 138  K 4.6 4.5  CL 115* 112*  CO2 20* 21*  GLUCOSE 124* 125*  BUN 38* 38*  CREATININE 0.66 0.62  CALCIUM 8.9 8.6*   PT/INR No results for input(s): "LABPROT", "INR" in the last 72 hours. ABG No results for input(s): "PHART", "HCO3" in the last 72 hours.  Invalid input(s): "PCO2", "PO2"  Studies/Results: No results found.  Anti-infectives: Anti-infectives (From admission, onward)    Start     Dose/Rate Route Frequency Ordered Stop   04/14/23 1100  cefoTEtan (CEFOTAN) 2 g in sodium chloride 0.9 % 100 mL IVPB        2 g 200 mL/hr over 30 Minutes Intravenous On call to O.R. 04/14/23 0845 04/14/23 1330       Assessment/Plan: POD#6 s/p expl lap and LOA by MT for SBO on 8/20  WBC down today. Will cont to follow  Advance diet Continue TNA for now   Vanita Panda MD 04/20/2023

## 2023-04-20 NOTE — Care Management Important Message (Signed)
Important Message  Patient Details IM Letter given. Name: Nicole Oconnor MRN: [id number] Date of Birth: 27-Feb-1946   Medicare Important Message Given:  Yes     Caren Macadam 04/20/2023, 2:12 PM

## 2023-04-20 NOTE — Progress Notes (Signed)
Physical Therapy Treatment Patient Details Name: Nicole Oconnor MRN: [id number] DOB: 1946/02/29 Today's Date: 04/20/2023   History of Present Illness 77 year old  who presents w/ abdominal pain, nausea, vomiting .  She was found to have SBO which failed to improve with conservative management.  She underwent exploratory laparotomy / lysis of adhesions on 04/14/23. 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 Pt reports being up in chair for most of the morning. Pt is planning for d/c home with HHPT.     If plan is discharge home, recommend the following: A little help with walking and/or transfers;A little help with bathing/dressing/bathroom;Assistance with cooking/housework;Assist for transportation;Help with stairs or ramp for entrance   Can travel by private vehicle        Equipment Recommendations  None recommended by PT    Recommendations for Other Services       Precautions / Restrictions Restrictions Weight Bearing Restrictions: No     Mobility  Bed Mobility Overal bed mobility: Needs Assistance Bed Mobility: Sit to Supine       Sit to supine: Mod assist, Max assist   General bed mobility comments: Max A for B LEs, bed pad used to assist lateral scooting once in bed    Transfers Overall transfer level: Needs assistance Equipment used: Rolling walker (2 wheels) Transfers: Sit to/from Stand, Bed to chair/wheelchair/BSC Sit to Stand: Contact guard assist   Step pivot transfers: Contact guard assist       General transfer comment: CGA assist to guide to standing from recliner, cues for hand placement; incr time to transition    Ambulation/Gait Ambulation/Gait assistance: Contact guard assist Gait Distance (Feet): 45 Feet Assistive device: Rolling walker (2 wheels) Gait  Pattern/deviations: Step-to pattern, Wide base of support, Trunk flexed, Decreased step length - right, Decreased step length - left Gait velocity: decreased     General Gait Details: slow but steady giat, cues for RW position and trunk extension as able   Stairs             Wheelchair Mobility     Tilt Bed    Modified Rankin (Stroke Patients Only)       Balance   Sitting-balance support: No upper extremity supported, Feet supported Sitting balance-Leahy Scale: Good Sitting balance - Comments: pt is able to wt shift laterally R/L and recover   Standing balance support: During functional activity, Reliant on assistive device for balance Standing balance-Leahy Scale: Fair Standing balance comment: able to static stand, perform pericare with CGA                            Cognition Arousal: Alert Behavior During Therapy: WFL for tasks assessed/performed Overall Cognitive Status: Within Functional Limits for tasks assessed                                          Exercises      General Comments        Pertinent Vitals/Pain Pain Assessment Pain Assessment: No/denies pain    Home Living                          Prior  Function            PT Goals (current goals can now be found in the care plan section) Acute Rehab PT Goals Patient Stated Goal: go home PT Goal Formulation: With patient Time For Goal Achievement: 04/30/23 Potential to Achieve Goals: Good Progress towards PT goals: Progressing toward goals    Frequency    Min 1X/week      PT Plan      Co-evaluation              AM-PAC PT "6 Clicks" Mobility   Outcome Measure  Help needed turning from your back to your side while in a flat bed without using bedrails?: A Lot Help needed moving from lying on your back to sitting on the side of a flat bed without using bedrails?: A Lot Help needed moving to and from a bed to a chair (including a  wheelchair)?: A Little Help needed standing up from a chair using your arms (e.g., wheelchair or bedside chair)?: A Little Help needed to walk in hospital room?: A Little Help needed climbing 3-5 steps with a railing? : A Lot 6 Click Score: 15    End of Session   Activity Tolerance: Patient tolerated treatment well Patient left: with call bell/phone within reach;in bed;with bed alarm set   PT Visit Diagnosis: Unsteadiness on feet (R26.81);Muscle weakness (generalized) (M62.81)     Time: 9604-5409 PT Time Calculation (min) (ACUTE ONLY): 28 min  Charges:    $Gait Training: 8-22 mins $Therapeutic Activity: 8-22 mins PT General Charges $$ ACUTE PT VISIT: 1 Visit                     Cecile Gillispie, PT  Acute Rehab Dept [facility]) [phone]  04/20/2023    [facility] 04/20/2023, 1:07 PM

## 2023-04-21 DIAGNOSIS — K56609 Unspecified intestinal obstruction, unspecified as to partial versus complete obstruction: Secondary | ICD-10-CM | POA: Diagnosis not present

## 2023-04-21 LAB — COMPREHENSIVE METABOLIC PANEL
ALT: 87 U/L — ABNORMAL HIGH (ref 0–44)
AST: 44 U/L — ABNORMAL HIGH (ref 15–41)
Albumin: 2.5 g/dL — ABNORMAL LOW (ref 3.5–5.0)
Alkaline Phosphatase: 194 U/L — ABNORMAL HIGH (ref 38–126)
Anion gap: 3 — ABNORMAL LOW (ref 5–15)
BUN: 36 mg/dL — ABNORMAL HIGH (ref 8–23)
CO2: 23 mmol/L (ref 22–32)
Calcium: 8.4 mg/dL — ABNORMAL LOW (ref 8.9–10.3)
Chloride: 105 mmol/L (ref 98–111)
Creatinine, Ser: 0.61 mg/dL (ref 0.44–1.00)
GFR, Estimated: >60 mL/min (ref >60)
Glucose, Bld: 113 mg/dL — ABNORMAL HIGH (ref 70–99)
Potassium: 4.6 mmol/L (ref 3.5–5.1)
Sodium: 131 mmol/L — ABNORMAL LOW (ref 135–145)
Total Bilirubin: 0.4 mg/dL (ref 0.3–1.2)
Total Protein: 5.5 g/dL — ABNORMAL LOW (ref 6.5–8.1)

## 2023-04-21 LAB — IRON AND TIBC
Iron: 49 ug/dL (ref 28–170)
Saturation Ratios: 20 % (ref 10.4–31.8)
TIBC: 249 ug/dL — ABNORMAL LOW (ref 250–450)
UIBC: 200 ug/dL

## 2023-04-21 LAB — CBC
HCT: 25.3 % — ABNORMAL LOW (ref 36.0–46.0)
Hemoglobin: 7.8 g/dL — ABNORMAL LOW (ref 12.0–15.0)
MCH: 27.6 pg (ref 26.0–34.0)
MCHC: 30.8 g/dL (ref 30.0–36.0)
MCV: 89.4 fL (ref 80.0–100.0)
Platelets: 261 10*3/uL (ref 150–400)
RBC: 2.83 MIL/uL — ABNORMAL LOW (ref 3.87–5.11)
RDW: 15.8 % — ABNORMAL HIGH (ref 11.5–15.5)
WBC: 11 10*3/uL — ABNORMAL HIGH (ref 4.0–10.5)
nRBC: 0 % (ref 0.0–0.2)

## 2023-04-21 LAB — RETICULOCYTES
Immature Retic Fract: 28.3 % — ABNORMAL HIGH (ref 2.3–15.9)
RBC.: 2.77 MIL/uL — ABNORMAL LOW (ref 3.87–5.11)
Retic Count, Absolute: 69.5 10*3/uL (ref 19.0–186.0)
Retic Ct Pct: 2.5 % (ref 0.4–3.1)

## 2023-04-21 LAB — VITAMIN B12: Vitamin B-12: 1202 pg/mL — ABNORMAL HIGH (ref 180–914)

## 2023-04-21 LAB — FERRITIN: Ferritin: 591 ng/mL — ABNORMAL HIGH (ref 11–307)

## 2023-04-21 LAB — PHOSPHORUS: Phosphorus: 4.1 mg/dL (ref 2.5–4.6)

## 2023-04-21 LAB — FOLATE: Folate: 8.4 ng/mL (ref >5.9)

## 2023-04-21 LAB — MAGNESIUM: Magnesium: 1.9 mg/dL (ref 1.7–2.4)

## 2023-04-21 MED ORDER — TRAVASOL 10 % IV SOLN
INTRAVENOUS | Status: AC
  Filled 2023-04-21: qty 547.2

## 2023-04-21 MED ORDER — TRAMADOL HCL 50 MG PO TABS: 50.0000 mg | ORAL_TABLET | Freq: Four times a day (QID) | ORAL | Status: DC | PRN

## 2023-04-21 NOTE — Plan of Care (Signed)
°  Problem: Elimination: °Goal: Will not experience complications related to bowel motility °Outcome: Progressing °  °Problem: Pain Managment: °Goal: General experience of comfort will improve °Outcome: Progressing °  °

## 2023-04-21 NOTE — Progress Notes (Signed)
PHARMACY - TOTAL PARENTERAL NUTRITION CONSULT NOTE   Indication: Prolonged ileus  Patient Measurements: Height: 5\' 3"  (160 cm) Weight: 82.8 kg (182 lb 8.7 oz) IBW/kg (Calculated) : 52.4 TPN AdjBW (KG): 59 Body mass index is 32.34 kg/m.   Assessment:  Patient is a 77 year old female with pheochromocytoma s/p left adrenalectomy in 10/2021, solitary right kidney, history of SBO s/p ex lap and lysis of additions in 05/2021, HTN who  presented with abdominal pain, nausea and vomiting.  Patient reported last BM on 8/13, since then persistent nausea and vomiting, unable to maintain p.o. intake.  Admitted for SBO, which is not responding to conservative treatment.  Surgery recommending exploratory laparotomy with lysis of adhesions (done 04/14/23). Pharmacy consulted for TPN.   Glucose / Insulin: AM glucose 131 SSI discontinue 8/24, as CBGs had been well controlled  Electrolytes: Na low; Note: phosphorous trending upward.  All other lytes WNL.  Corrected Ca=9.6  Renal: AKI resolved; BUN still elevated but trending down  Hepatic:   LFTs elevated.   AST= 44, slightly above ULN ALT= 87, 2x ULN Alk Phos= 194, 1.5x ULN - T.bil WNL.   - Albumin remains low - TG WNL (8/26)  Intake / Output; MIVF:  - IVF: 1/2 NS at kvo - NG: 250>>clamped>>removed 8/24 From 8/26 7am to 8/27 7am - PO: Per Dr. [name], tolerating diet.  Family brought in food, including potato soup, which she ate and likely wasn't documented.  8/27: 50% of lunch meal documented. - Urine: + 6x unmeasured occurrences - BM: 2 x unmeasured stoop occurences  GI Imaging: - 8/15 CTa/p: Developing or partial obstruction - 8/16, 8/18, 8/19 AXR: all show stable (possibly partial) SBO  GI Surgeries / Procedures:  - 8/20: Exploratory laparotomy, extensive lysis of adhesions   Central access: PICC TPN start date: 8/19  Nutritional Goals: Goal TPN rate is 80 mL/hr (provides 109 g of protein and 1897 kcals per  day)  RD Assessment: Estimated Needs Total Energy Estimated Needs: 1800-2000 kcals Total Protein Estimated Needs: 90-110 grams Total Fluid Estimated Needs: >/= 1.8L  Current Nutrition:  8/26: Clear Liquid to Regular Diet Continue TPN Completed Thiamine 100 mg IV daily x 6 days (8/19- 8/24)  Plan:   Decrease TPN to 1/2 goal rate, 40 ml/hr, today at 1800 (Discussed with Dr. [name]) Electrolytes in TPN:  Na 130 mEq/L K 30 mEq/L Ca 5 mEq/L  Mg 4 mEq/L Phos 0 mmol/L Cl:Ac 1:1 Increased Na in TPN due to low level As a result of the lower volume-- Adjust magnesium to maintain same total amount as previous day.  Potassium will result in lower total amount in TPN form previous day expect need will be less.  .  Maintain TPN reformulation on 8/26- with lipids to 25g/L (from 30g/L) due to elevated LFTs.   Add standard MVI and trace elements to TPN Previously discontinued SSI  1/2 NS to KVO CMP, Mag, Phos level in the AM Monitor TPN labs on Mon/Thurs and prn  Tacy Learn, PharmD Clinical Pharmacist 04/21/2023 12:38 PM

## 2023-04-21 NOTE — Progress Notes (Signed)
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  Unable to tolerate oral intakes.  Further evaluation she was found to have SBO, she failed to improve with conservative management.  She underwent exploratory laparotomy with lysis of adhesions on 8/20. Bowel function return, NGT removed. Started on regular diet on 8/26. Poor oral intake, still requiring TPN. Wean TPN when possible.    Assessment & Plan:   Principal Problem:   SBO (small bowel obstruction) (HCC) Active Problems:   Essential hypertension   Gout   Edema   H/O total adrenalectomy (HCC)   AKI (acute kidney injury) (HCC)   Protein-calorie malnutrition, severe   1-SBO: -Multiple prior abdominal surgery, prior history of SBO with history of exploratory laparotomy and lysis of adhesions 05/2021. -Presented with abdominal pain, constipation, had NG tube, she failed to improve with conservative management. -Underwent exploratory laparotomy and lysis of adhesions 8/20 -On TPN since 8/19. -Surgery following and managing -NGT to be removed on  8/24. -having BM. Diet advanced regular diet 8/26. She report poor oral intake. Start weaning TPN when oral intake improved.   2-Hypertension: Blood pressure medication on hold due to soft-low blood pressure  3-AKI presented with a creatinine of 2.5, prior creatinine 0.7 Improved with hydration  4-Anemia:. Monitor for bleeding. Anemia of acute illness.  anemia panel  consistent with anemia of chronic diseases.  Hb: 9---7.9---7.8  Continue to monitor Hb.  On PPI  Transaminases; monitor, on TPN.   Trending down.   Hyponatremia: Corrected with IV fluids.  Hypokalemia Replace as needed Obesity BMI 30 need lifestyle modification  Cough; Report cough. Chest x ray with atelectasis.  Incentive spirometry, Flutter valve.  Started  Guaifenesin.  Report improved cough.   Hypermagnesemia; in setting of AKI. Resolved.  Now Hypomagnesemia; replete IV>      Nutrition Problem: Severe Malnutrition Etiology: acute illness (SBO)    Signs/Symptoms: moderate muscle depletion, energy intake < or equal to 50% for > or equal to 5 days    Interventions: Refer to RD note for recommendations, TPN  Estimated body mass index is 32.34 kg/m as calculated from the following:   Height as of this encounter: 5\' 3"  (1.6 m).   Weight as of this encounter: 82.8 kg.   DVT prophylaxis: Heparin Code Status: Full code Family Communication: Son at bedside 8/25 Disposition Plan:  Status is: Inpatient Remains inpatient appropriate because: Management of SBO    Consultants:  General Sx  Procedures:  Lysis of adhesion.   Antimicrobials:    Subjective: Cough improved.  Denies worsening abdominal pain.  She only ate breakfast yesterday. She ate breakfast today. Will try to lunch. She doesn't like food.   Objective: Vitals:   04/20/23 2138 04/21/23 0423 04/21/23 0429 04/21/23 1257  BP: 116/69  (!) 102/59 99/71  Pulse: 85  76 81  Resp: 18  19   Temp: 98.6 F (37 C)  98.5 F (36.9 C) 98.3 F (36.8 C)  TempSrc: Oral  Oral Oral  SpO2: 100%  99% 99%  Weight:  82.8 kg    Height:        Intake/Output Summary (Last 24 hours) at 04/21/2023 1353 Last  data filed at 04/21/2023 1302 Gross per 24 hour  Intake 2569.37 ml  Output 0 ml  Net 2569.37 ml   Filed Weights   04/19/23 0553 04/20/23 0500 04/21/23 0423  Weight: 86.6 kg 82.2 kg 82.8 kg    Examination:  General exam: NAD Respiratory system: CTA Cardiovascular system: S 1, S  2 RRR Gastrointestinal system:  midline wound with  honeycomb dressing. Soft, mild tender.      Data Reviewed: I have personally reviewed following labs and imaging studies  CBC: Recent Labs  Lab 04/16/23 0411 04/17/23 0828 04/19/23 0317 04/20/23 0322 04/21/23 0543  WBC 12.8* 11.1* 13.6* 12.6* 11.0*  HGB 9.5* 9.5* 9.0* 7.9* 7.8*  HCT 30.8* 31.1* 29.1* 26.1* 25.3*  MCV 90.1 92.6 92.4 92.6 89.4  PLT 188 219 221 220 261   Basic Metabolic Panel: Recent Labs  Lab 04/15/23 0456 04/16/23 0411 04/17/23 0245 04/18/23 0234 04/19/23 0317 04/20/23 0322 04/21/23 0543  NA 144 144 142 143 140 138 131*  K 3.8 3.5 3.8 4.4 4.6 4.5 4.6  CL 97* 104 107 113* 115* 112* 105  CO2 37* 32 28 24 20* 21* 23  GLUCOSE 150* 131* 124* 129* 124* 125* 113*  BUN 57* 37* 35* 35* 38* 38* 36*  CREATININE 1.07* 0.79 0.71 0.68 0.66 0.62 0.61  CALCIUM 8.7* 8.9 8.7* 8.9 8.9 8.6* 8.4*  MG 2.6* 2.3  --  1.9  --  1.5* 1.9  PHOS 3.7 2.4* 2.9 3.4  --  3.6 4.1   GFR: Estimated Creatinine Clearance: 59.1 mL/min (by C-G formula based on SCr of 0.61 mg/dL). Liver Function Tests: Recent Labs  Lab 04/15/23 0456 04/16/23 0411 04/20/23 0322 04/21/23 0543  AST 40 29 52* 44*  ALT 16 29 90* 87*  ALKPHOS 77 79 170* 194*  BILITOT 1.2 0.9 0.6 0.4  PROT 6.4* 6.4* 6.0* 5.5*  ALBUMIN 2.9* 2.7* 2.5* 2.5*   No results for input(s): "LIPASE", "AMYLASE" in the last 168 hours.  No results for input(s): "AMMONIA" in the last 168 hours. Coagulation Profile: No results for input(s): "INR", "PROTIME" in the last 168 hours. Cardiac Enzymes: No results for input(s): "CKTOTAL", "CKMB", "CKMBINDEX", "TROPONINI" in the last 168 hours. BNP (last 3 results) No results for input(s): "PROBNP" in the last 8760 hours. HbA1C: No results for input(s): "HGBA1C" in the last 72 hours. CBG: Recent Labs  Lab 04/18/23 0611 04/18/23 1153 04/18/23 1740 04/19/23 0011 04/19/23 0549  GLUCAP 120* 107* 107* 124* 118*   Lipid Profile: Recent Labs    04/20/23 0322  TRIG 90     Thyroid Function Tests: No results for input(s): "TSH", "T4TOTAL", "FREET4", "T3FREE", "THYROIDAB" in the last 72 hours. Anemia Panel: Recent Labs    04/21/23 0543  VITAMINB12 1,202*  FOLATE 8.4  FERRITIN 591*  TIBC 249*  IRON 49  RETICCTPCT 2.5   Sepsis Labs: No results for input(s): "PROCALCITON", "LATICACIDVEN" in the last 168 hours.   No results found for this or any previous visit (from the past 240 hour(s)).        Radiology Studies: No results found.      Scheduled Meds:  Chlorhexidine Gluconate Cloth  6 each Topical Daily   feeding supplement  237 mL Oral BID BM   guaiFENesin  1,200 mg Oral BID   heparin  5,000 Units Subcutaneous Q8H   pantoprazole (PROTONIX) IV  40 mg Intravenous Q12H   sodium chloride flush  10-40 mL Intracatheter Q12H   Continuous Infusions:  sodium chloride 10  mL/hr at 04/21/23 0156   methocarbamol (ROBAXIN) IV 500 mg (04/21/23 0550)   ondansetron (ZOFRAN) IV     TPN ADULT (ION) 80 mL/hr at 04/20/23 1744   TPN ADULT (ION)       LOS: 12 days    Time spent: 35 minutes    Jacksyn Beeks A Cheryel Kyte, MD [facility]   If 7PM-7AM, please contact night-coverage [email or web address]  04/21/2023, 1:53 PM

## 2023-04-21 NOTE — Progress Notes (Signed)
7 Days Post-Op   Subjective/Chief Complaint: Doing well, tolerating a diet   Objective: Vital signs in last 24 hours: Temp:  [97.8 F (36.6 C)-98.6 F (37 C)] 98.5 F (36.9 C) (08/27 0429) Pulse Rate:  [76-85] 76 (08/27 0429) Resp:  [18-19] 19 (08/27 0429) BP: (102-116)/(59-69) 102/59 (08/27 0429) SpO2:  [99 %-100 %] 99 % (08/27 0429) Weight:  [82.8 kg] 82.8 kg (08/27 0423) Last BM Date : 04/20/23  Intake/Output from previous day: 08/26 0701 - 08/27 0700 In: 3023.2 [P.O.:1010; I.V.:1792.6; IV Piggyback:220.6] Out: 150 [Urine:150] Intake/Output this shift: No intake/output data recorded.  Exam: Awake and alert Abdomen soft, minimally tender Inc: C/D/I Lab Results:  Recent Labs    04/20/23 0322 04/21/23 0543  WBC 12.6* 11.0*  HGB 7.9* 7.8*  HCT 26.1* 25.3*  PLT 220 261   BMET Recent Labs    04/20/23 0322 04/21/23 0543  NA 138 131*  K 4.5 4.6  CL 112* 105  CO2 21* 23  GLUCOSE 125* 113*  BUN 38* 36*  CREATININE 0.62 0.61  CALCIUM 8.6* 8.4*   PT/INR No results for input(s): "LABPROT", "INR" in the last 72 hours. ABG No results for input(s): "PHART", "HCO3" in the last 72 hours.  Invalid input(s): "PCO2", "PO2"  Studies/Results: No results found.  Anti-infectives: Anti-infectives (From admission, onward)    Start     Dose/Rate Route Frequency Ordered Stop   04/14/23 1100  cefoTEtan (CEFOTAN) 2 g in sodium chloride 0.9 % 100 mL IVPB        2 g 200 mL/hr over 30 Minutes Intravenous On call to O.R. 04/14/23 0845 04/14/23 1330       Assessment/Plan: POD#7 s/p expl lap and LOA by MT for SBO on 8/20  WBC continues to trend down.   Cont Reg diet 1/2 rate TNA   Vanita Panda MD 04/21/2023

## 2023-04-21 NOTE — Progress Notes (Signed)
Nutrition Follow-up  DOCUMENTATION CODES:   Severe malnutrition in context of acute illness/injury  INTERVENTION:   -TPN management per Pharmacy -Daily weights while on TPN  -Ensure Plus High Protein po BID, each supplement provides 350 kcal and 20 grams of protein.   NUTRITION DIAGNOSIS:   Severe Malnutrition related to acute illness (SBO) as evidenced by moderate muscle depletion, energy intake < or equal to 50% for > or equal to 5 days.  Ongoing.  GOAL:   Patient will meet greater than or equal to 90% of their needs  Progressing.  MONITOR:   Diet advancement, Labs, Weight trends, I & O's, TPN, PO intakes  ASSESSMENT:   77 y.o. 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 Admitted for SBO.  8/15: admitted 8/16: NGT placed 8/19: TPN initiated 8/20: s/p ex lap, LOA 8/23: CLD 8/24: NGT removed 8/26: Regular diet  Patient now on regular diet, consumed 100% of breakfast this morning. Pt does not like hospital food. Will add Ensure to provide additional kcals and protein.  Per Pharmacy note, TPN to decrease to half rate today, 40 ml/hr, providing 948 kcals and 48g protein.  Admission weight: 185 lbs Current weight: 182 lbs  Medications reviewed.  Labs reviewed: CBGs: 107-124 Low Na   Diet Order:   Diet Order             Diet regular Room service appropriate? Yes; Fluid consistency: Thin  Diet effective now                   EDUCATION NEEDS:   Education needs have been addressed  Skin:  Skin Assessment: Reviewed RN Assessment  Last BM:  8/27 -type 6  Height:   Ht Readings from Last 1 Encounters:  04/14/23 5\' 3"  (1.6 m)    Weight:   Wt Readings from Last 1 Encounters:  04/21/23 82.8 kg    BMI:  Body mass index is 32.34 kg/m.  Estimated  Nutritional Needs:   Kcal:  1800-2000 kcals  Protein:  90-110 grams  Fluid:  >/= 1.8L  Tilda Franco, MS, RD, LDN Inpatient Clinical Dietitian Contact information available via Amion

## 2023-04-21 NOTE — Progress Notes (Signed)
Occupational Therapy Treatment Patient Details Name: Nicole Oconnor MRN: [id number] DOB: 1945-11-08 Today's Date: 04/21/2023   History of present illness 77 year old  who presents w/ abdominal pain, nausea, vomiting .  She was found to have SBO which failed to improve with conservative management.  She underwent exploratory laparotomy / lysis of adhesions on 04/14/23. 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  Patient needing Mod A for basic bed mobility, but CGA for standing and functional mobility in the room at RW level.  Patient largely setup for seated grooming and to eat breakfast.  Patient able to transfer to the [facility], and performed hygiene with Min A only in standing.  Mod A to don brief and to assist with pant management in standing.  OT to continue efforts in the acute setting with [facility] OT recommended for post acute.        If plan is discharge home, recommend the following:  Assistance with cooking/housework;Help with stairs or ramp for entrance;Assist for transportation;A little help with bathing/dressing/bathroom;A little help with walking and/or transfers   Equipment Recommendations  None recommended by OT    Recommendations for Other Services      Precautions / Restrictions Precautions Precautions: Fall Restrictions Weight Bearing Restrictions: No       Mobility Bed Mobility   Bed Mobility: Supine to Sit     Supine to sit: Mod assist       Patient Response: Cooperative  Transfers Overall transfer level: Needs assistance Equipment used: Rolling walker (2 wheels) Transfers: Sit to/from Stand, Bed to chair/wheelchair/BSC Sit to Stand: Contact guard assist     Step pivot transfers: Contact guard assist           Balance Overall balance assessment: Needs assistance Sitting-balance  support: No upper extremity supported, Feet supported Sitting balance-Leahy Scale: Good     Standing balance support: Reliant on assistive device for balance Standing balance-Leahy Scale: Fair                             ADL either performed or assessed with clinical judgement   ADL       Grooming: Set up;Sitting           Upper Body Dressing : Minimal assistance;Sitting   Lower Body Dressing: Moderate assistance;Sit to/from stand;Maximal assistance   Toilet Transfer: Contact guard assist;Stand-pivot;BSC/3in1   Toileting- Clothing Manipulation and Hygiene: Minimal assistance;Sit to/from stand              Extremity/Trunk Assessment Upper Extremity Assessment Upper Extremity Assessment: Generalized weakness LUE Deficits / Details: Chronic shoulder ROM limitations, with AROM for shoulder flexion being ~120 degrees. Elbow, wrist, and hand AROM WFL. Functional grip strength   Lower Extremity Assessment Lower Extremity Assessment: Defer to PT evaluation   Cervical / Trunk Assessment Cervical / Trunk Assessment: Normal    Vision Baseline Vision/History: 1 Wears glasses Patient Visual Report: No change from baseline     Perception Perception Perception: Not tested   Praxis Praxis Praxis: Not tested    Cognition Arousal: Alert Behavior During Therapy: [facility] for tasks assessed/performed Overall Cognitive Status: Within Functional Limits for tasks assessed  Pertinent Vitals/ Pain       Pain Assessment Pain Assessment: Faces Faces Pain Scale: Hurts a little bit Pain Location: abdomen Pain Descriptors / Indicators: Aching Pain Intervention(s): Monitored during session                                                          Frequency  Min 1X/week        Progress Toward Goals  OT Goals(current goals can now be found in the care plan  section)  Progress towards OT goals: Progressing toward goals  Acute Rehab OT Goals OT Goal Formulation: With patient Time For Goal Achievement: 05/01/23 Potential to Achieve Goals: Good  Plan      Co-evaluation                 AM-PAC OT "6 Clicks" Daily Activity     Outcome Measure   Help from another person eating meals?: A Little Help from another person taking care of personal grooming?: A Little Help from another person toileting, which includes using toliet, bedpan, or urinal?: A Little Help from another person bathing (including washing, rinsing, drying)?: A Lot Help from another person to put on and taking off regular upper body clothing?: A Little Help from another person to put on and taking off regular lower body clothing?: A Lot 6 Click Score: 16    End of Session Equipment Utilized During Treatment: Rolling walker (2 wheels)  OT Visit Diagnosis: Unsteadiness on feet (R26.81);Muscle weakness (generalized) (M62.81)   Activity Tolerance Patient tolerated treatment well   Patient Left in chair;with call bell/phone within reach   Nurse Communication Mobility status        Time: 1130-1151 OT Time Calculation (min): 21 min  Charges: OT General Charges $OT Visit: 1 Visit OT Treatments $Self Care/Home Management : 8-22 mins  04/21/2023  RP, OTR/L  Acute Rehabilitation Services  Office:  [phone]   Suzanna Obey 04/21/2023, 11:55 AM

## 2023-04-21 NOTE — TOC Progression Note (Signed)
Transition of Care [facility]) - Progression Note    Patient Details  Name: Nicole Oconnor MRN: [id number] Date of Birth: 1945-09-09  Transition of Care [facility]) CM/SW Contact  Darleene Cleaver, [place] Phone Number: 04/21/2023, 5:31 PM  Clinical Narrative:     TOC continuing to follow patients progress throughout discharge planning.  Patient still on TPN, trying to advance her diet.  PT recommending HH.     Barriers to Discharge: Continued Medical Work up  Expected Discharge Plan and Services                                               Social Determinants of Health (SDOH) Interventions SDOH Screenings   Food Insecurity: No Food Insecurity (04/09/2023)  Housing: Low Risk  (04/09/2023)  Transportation Needs: Unmet Transportation Needs (04/09/2023)  Utilities: Not At Risk (04/09/2023)  Depression (PHQ2-9): Low Risk  (06/14/2020)  Financial Resource Strain: Low Risk  (06/14/2020)  Physical Activity: Inactive (06/14/2020)  Stress: No Stress Concern Present (06/14/2020)  Tobacco Use: Low Risk  (04/14/2023)    Readmission Risk Interventions    04/10/2023   10:29 AM  Readmission Risk Prevention Plan  Transportation Screening Complete  PCP or Specialist Appt within 5-7 Days Complete  Home Care Screening Complete  Medication Review (RN CM) Complete

## 2023-04-22 DIAGNOSIS — K56609 Unspecified intestinal obstruction, unspecified as to partial versus complete obstruction: Secondary | ICD-10-CM | POA: Diagnosis not present

## 2023-04-22 LAB — COMPREHENSIVE METABOLIC PANEL
ALT: 132 U/L — ABNORMAL HIGH (ref 0–44)
AST: 79 U/L — ABNORMAL HIGH (ref 15–41)
Albumin: 2.6 g/dL — ABNORMAL LOW (ref 3.5–5.0)
Alkaline Phosphatase: 191 U/L — ABNORMAL HIGH (ref 38–126)
Anion gap: 9 (ref 5–15)
BUN: 33 mg/dL — ABNORMAL HIGH (ref 8–23)
CO2: 23 mmol/L (ref 22–32)
Calcium: 9 mg/dL (ref 8.9–10.3)
Chloride: 106 mmol/L (ref 98–111)
Creatinine, Ser: 0.65 mg/dL (ref 0.44–1.00)
GFR, Estimated: >60 mL/min (ref >60)
Glucose, Bld: 107 mg/dL — ABNORMAL HIGH (ref 70–99)
Potassium: 4.2 mmol/L (ref 3.5–5.1)
Sodium: 138 mmol/L (ref 135–145)
Total Bilirubin: 0.3 mg/dL (ref 0.3–1.2)
Total Protein: 6.1 g/dL — ABNORMAL LOW (ref 6.5–8.1)

## 2023-04-22 LAB — PHOSPHORUS: Phosphorus: 3.4 mg/dL (ref 2.5–4.6)

## 2023-04-22 LAB — CBC
HCT: 25 % — ABNORMAL LOW (ref 36.0–46.0)
Hemoglobin: 7.7 g/dL — ABNORMAL LOW (ref 12.0–15.0)
MCH: 27.9 pg (ref 26.0–34.0)
MCHC: 30.8 g/dL (ref 30.0–36.0)
MCV: 90.6 fL (ref 80.0–100.0)
Platelets: 288 10*3/uL (ref 150–400)
RBC: 2.76 MIL/uL — ABNORMAL LOW (ref 3.87–5.11)
RDW: 15.9 % — ABNORMAL HIGH (ref 11.5–15.5)
WBC: 8.9 10*3/uL (ref 4.0–10.5)
nRBC: 0 % (ref 0.0–0.2)

## 2023-04-22 LAB — MAGNESIUM: Magnesium: 1.9 mg/dL (ref 1.7–2.4)

## 2023-04-22 MED ORDER — ADULT MULTIVITAMIN W/MINERALS CH
1.0000 | ORAL_TABLET | Freq: Every day | ORAL | Status: DC
  Administered 2023-04-23: 1 via ORAL
  Filled 2023-04-22: qty 1

## 2023-04-22 MED ORDER — ENSURE ENLIVE PO LIQD
237.0000 mL | Freq: Three times a day (TID) | ORAL | Status: DC
  Administered 2023-04-22 – 2023-04-23 (×4): 237 mL via ORAL

## 2023-04-22 NOTE — Progress Notes (Signed)
Occupational Therapy Treatment Patient Details Name: Nicole Oconnor MRN: [id number] DOB: 12-30-1945 Today's Date: 04/22/2023   History of present illness 77 year old  who presents w/ abdominal pain, nausea, vomiting .  She was found to have SBO which failed to improve with conservative management.  She underwent exploratory laparotomy / lysis of adhesions on 04/14/23. 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 Once seated EOB, OT educated pt and her family member on equipment recommendations and compensatroy strategies for self-care management. Specifically, pt was educated on implementing the figure 4 technique for lower body dressing vs. using a reacher and sock aid to perform. Pt used a reacher and sock aid to demo teach back of sock management seated EOB, requiring min assist and cues for correct AE use in this regard. OT also educated her on using a reacher to donn bottoms, such as pants or underwear, and option for use of a long handled sponge for lower body bathing. She required assist for B LE for back to bed. Overall, she presented with good effort and participation. Continue OT plan of care.       If plan is discharge home, recommend the following:  Assistance with cooking/housework;Help with stairs or ramp for entrance;Assist for transportation;A little help with bathing/dressing/bathroom;A little help with walking and/or transfers   Equipment Recommendations  None recommended by OT    Recommendations for Other Services      Precautions / Restrictions Precautions Precautions: Fall Restrictions Weight Bearing Restrictions: No       Mobility Bed Mobility Overal bed mobility: Needs Assistance Bed Mobility: Supine to Sit, Rolling Rolling: Contact guard assist   Supine to sit: Min assist, Used rails, HOB elevated Sit to  supine: Mod assist (required assist for BLE)               ADL either performed or assessed with clinical judgement   ADL Overall ADL's : Needs assistance/impaired Eating/Feeding: Sitting Eating/Feeding Details (indicate cue type and reason): she has adequate BUE function needed to self-feed independently. Grooming: Set up;Sitting   Upper Body Bathing: Set up;Sitting Upper Body Bathing Details (indicate cue type and reason): based on clinical judgement Lower Body Bathing: Moderate assistance Lower Body Bathing Details (indicate cue type and reason): based on clinical judgement Upper Body Dressing : Minimal assistance;Sitting   Lower Body Dressing: Moderate assistance;Maximal assistance;With adaptive equipment;Cueing for sequencing                 General ADL Comments: OT educated pt and her family member on equipment recommendations and compensatroy strategies for self-care management. Specifically, pt was educated on implementing the figure 4 technique for lower body dressing vs. using a reacher and sock aid to perform. Pt used a reacher and sock aid to demo teach back of sock management seated EOB, requiring min assist and cues for correct AE use in this regard. OT also educated her on using a reacher to donn bottoms, such as pants or underwear, and option for use of a long handled sponge for lower body bathing.      Cognition Arousal: Alert Behavior During Therapy: WFL for tasks assessed/performed Overall Cognitive Status: Within Functional Limits for tasks assessed                            Pertinent Vitals/  Pain       Pain Assessment Pain Assessment: No/denies pain         Frequency  Min 1X/week        Progress Toward Goals  OT Goals(current goals can now be found in the care plan section)  Progress towards OT goals: Progressing toward goals  Acute Rehab OT Goals Patient Stated Goal: to return home with home therapy at discharge OT Goal  Formulation: With patient Potential to Achieve Goals: Good  Plan         AM-PAC OT "6 Clicks" Daily Activity     Outcome Measure   Help from another person eating meals?: A Little Help from another person taking care of personal grooming?: A Little Help from another person toileting, which includes using toliet, bedpan, or urinal?: A Little Help from another person bathing (including washing, rinsing, drying)?: A Lot Help from another person to put on and taking off regular upper body clothing?: A Little Help from another person to put on and taking off regular lower body clothing?: A Lot 6 Click Score: 16    End of Session Equipment Utilized During Treatment: Other (comment) (N/A)  OT Visit Diagnosis: Unsteadiness on feet (R26.81);Muscle weakness (generalized) (M62.81)   Activity Tolerance Patient tolerated treatment well   Patient Left in bed;with call bell/phone within reach;with bed alarm set;with family/visitor present   Nurse Communication Mobility status        Time: 1610-9604 OT Time Calculation (min): 21 min  Charges: OT General Charges $OT Visit: 1 Visit OT Treatments $Self Care/Home Management : 8-22 mins     Reuben Likes, OTR/L 04/22/2023, 5:50 PM

## 2023-04-22 NOTE — Progress Notes (Signed)
Nutrition Follow-up  DOCUMENTATION CODES:   Severe malnutrition in context of acute illness/injury  INTERVENTION:   -TPN management per Pharmacy  -Ensure Plus High Protein po TID, each supplement provides 350 kcal and 20 grams of protein.   -Multivitamin with minerals daily once TPN is complete  NUTRITION DIAGNOSIS:   Severe Malnutrition related to acute illness (SBO) as evidenced by moderate muscle depletion, energy intake < or equal to 50% for > or equal to 5 days.  Ongoing.  GOAL:   Patient will meet greater than or equal to 90% of their needs  Progressing.  MONITOR:   Diet advancement, Labs, Weight trends, I & O's, PO intake, supplement acceptance  ASSESSMENT:   77 y.o. 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 Admitted for SBO.  Patient in room, TPN still running at 40 ml/hr today.  Reports she ate 100% of her yogurt and oatmeal today as well as some apple juice. States she was never given Ensure yesterday despite it being ordered for BID. Pt reports she did not refuse it, just was never offered. Encouraged pt to make sure she receives her supplements today. RD provided pt her first Ensure of the day at bedside. Pt with no flavor preference.  Spoke with Pharmacy this morning regarding TPN. Recommended stopping TPN this evening as pt is eating more and likely to get closer to needs if provided her Ensure as ordered. Per Pharmacy, surgery agreed and plan is to d/c TPN tonight.  Admission weight: 185 lbs Current weight: 193 lbs  Medications reviewed.  Labs reviewed: CBGs; 95-124  TG 90  Diet Order:   Diet Order             Diet regular Room service appropriate? Yes; Fluid consistency: Thin  Diet effective now                   EDUCATION NEEDS:   Education  needs have been addressed  Skin:  Skin Assessment: Reviewed RN Assessment  Last BM:  8/27 -type 6  Height:   Ht Readings from Last 1 Encounters:  04/14/23 5\' 3"  (1.6 m)    Weight:   Wt Readings from Last 1 Encounters:  04/22/23 87.6 kg    BMI:  Body mass index is 34.21 kg/m.  Estimated Nutritional Needs:   Kcal:  1600-1800  Protein:  75-90g  Fluid:  >/= 1.8L  Tilda Franco, MS, RD, LDN Inpatient Clinical Dietitian Contact information available via Amion

## 2023-04-22 NOTE — Plan of Care (Signed)
  Problem: Clinical Measurements: Goal: Ability to maintain clinical measurements within normal limits will improve Outcome: Progressing Goal: Will remain free from infection Outcome: Progressing   

## 2023-04-22 NOTE — Progress Notes (Signed)
Progress Note  8 Days Post-Op  Subjective: Pt denies abdominal pain or nausea. Having bowel function. Eating some oatmeal and blueberry yogurt this AM and reports her daughter may bring her some potato soup for lunch. She does not like the food here which is part of why she hasn't been able to eat much. Wants to do whatever she needs to get home.   Objective: Vital signs in last 24 hours: Temp:  [97.9 F (36.6 C)-98.3 F (36.8 C)] 98 F (36.7 C) (08/28 0508) Pulse Rate:  [71-81] 71 (08/28 0508) Resp:  [18-22] 22 (08/28 0508) BP: (99-108)/(60-71) 108/65 (08/28 0508) SpO2:  [99 %-100 %] 100 % (08/28 0508) Weight:  [87.6 kg] 87.6 kg (08/28 0500) Last BM Date : 04/21/23  Intake/Output from previous day: 08/27 0701 - 08/28 0700 In: 2758.1 [P.O.:590; I.V.:1947.9; IV Piggyback:220.2] Out: 0  Intake/Output this shift: Total I/O In: 120 [P.O.:120] Out: 100 [Urine:100]  PE: General: pleasant, WD, elderly female who is laying in bed in NAD Heart: regular, rate, and rhythm. Lungs: Respiratory effort nonlabored Abd: soft, NT, ND, incision C/D/I with staples present  Psych: A&Ox3 with an appropriate affect.    Lab Results:  Recent Labs    04/21/23 0543 04/22/23 0356  WBC 11.0* 8.9  HGB 7.8* 7.7*  HCT 25.3* 25.0*  PLT 261 288   BMET Recent Labs    04/21/23 0543 04/22/23 0356  NA 131* 138  K 4.6 4.2  CL 105 106  CO2 23 23  GLUCOSE 113* 107*  BUN 36* 33*  CREATININE 0.61 0.65  CALCIUM 8.4* 9.0   PT/INR No results for input(s): "LABPROT", "INR" in the last 72 hours. CMP     Component Value Date/Time   NA 138 04/22/2023 0356   NA 144 03/03/2018 0000   K 4.2 04/22/2023 0356   CL 106 04/22/2023 0356   CO2 23 04/22/2023 0356   GLUCOSE 107 (H) 04/22/2023 0356   BUN 33 (H) 04/22/2023 0356   BUN 15 03/03/2018 0000   CREATININE 0.65 04/22/2023 0356   CALCIUM 9.0 04/22/2023 0356   PROT 6.1 (L) 04/22/2023 0356   ALBUMIN 2.6 (L) 04/22/2023 0356   AST 79 (H)  04/22/2023 0356   ALT 132 (H) 04/22/2023 0356   ALKPHOS 191 (H) 04/22/2023 0356   BILITOT 0.3 04/22/2023 0356   GFRNONAA >60 04/22/2023 0356   Lipase     Component Value Date/Time   LIPASE 42 04/09/2023 1356       Studies/Results: No results found.  Anti-infectives: Anti-infectives (From admission, onward)    Start     Dose/Rate Route Frequency Ordered Stop   04/14/23 1100  cefoTEtan (CEFOTAN) 2 g in sodium chloride 0.9 % 100 mL IVPB        2 g 200 mL/hr over 30 Minutes Intravenous On call to O.R. 04/14/23 0845 04/14/23 1330        Assessment/Plan SBO POD8 s/p ex-lap with LOA Dr. Corliss Skains - WBC normalized - tolerating diet and working on PO intake but does not like the food here, having bowel function  - ensure TID if nothing else at mealtimes but fine to eat outside food as well  - stop TPN  today - discussed with pharmacist  - I think patient will eat better at home with food available that she likes so should work towards getting her home as soon as medically reasonable  FEN: Reg diet, stop TPN  VTE: SQH ID: [email or web address] pre-op  LOS: 13 days  Juliet Rude, [facility] 04/22/2023, 10:03 AM Please see Amion for pager number during day hours 7:00am-4:30pm

## 2023-04-22 NOTE — Progress Notes (Signed)
Physical Therapy Treatment Patient Details Name: Nicole Oconnor MRN: [id number] DOB: 09-Sep-1945 Today's Date: 04/22/2023   History of Present Illness 77 year old  who presents w/ abdominal pain, nausea, vomiting .  She was found to have SBO which failed to improve with conservative management.  She underwent exploratory laparotomy / lysis of adhesions on 04/14/23. 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 Tol incr gait distance with gait velocity improving as well. Pt is motivated to mobilize and progress to return home. Continue PT POC, HHPT at d/c   If plan is discharge home, recommend the following: A little help with walking and/or transfers;A little help with bathing/dressing/bathroom;Assistance with cooking/housework;Assist for transportation;Help with stairs or ramp for entrance   Can travel by private vehicle        Equipment Recommendations  None recommended by PT    Recommendations for Other Services       Precautions / Restrictions Precautions Precautions: Fall Restrictions Weight Bearing Restrictions: No     Mobility  Bed Mobility               General bed mobility comments: OOB with NT on arrival Patient Response: Cooperative  Transfers Overall transfer level: Needs assistance Equipment used: Rolling walker (2 wheels) Transfers: Sit to/from Stand Sit to Stand: Contact guard assist, Supervision                Ambulation/Gait Ambulation/Gait assistance: Contact guard assist, Supervision Gait Distance (Feet): 45 Feet Assistive device: Rolling walker (2 wheels) Gait Pattern/deviations: Step-through pattern, Wide base of support, Decreased stride length       General Gait Details: gait steady with RW, no rest breaks needed. cues for RW position and trunk extension as able. distance to  tolerance   Stairs             Wheelchair Mobility     Tilt Bed Tilt Bed Patient Response: Cooperative  Modified Rankin (Stroke Patients Only)       Balance     Sitting balance-Leahy Scale: Good     Standing balance support: Reliant on assistive device for balance Standing balance-Leahy Scale: Fair                              Cognition Arousal: Alert Behavior During Therapy: WFL for tasks assessed/performed Overall Cognitive Status: Within Functional Limits for tasks assessed                                          Exercises      General Comments        Pertinent Vitals/Pain Pain Assessment Pain Assessment: No/denies pain    Home Living                          Prior Function            PT Goals (current goals can now be found in the care plan section) Acute Rehab PT Goals Patient Stated Goal: go home PT Goal Formulation: With patient Time For Goal Achievement: 04/30/23 Potential to Achieve Goals: Good Progress towards PT goals: Progressing toward goals    Frequency    Min 1X/week  PT Plan      Co-evaluation              AM-PAC PT "6 Clicks" Mobility   Outcome Measure  Help needed turning from your back to your side while in a flat bed without using bedrails?: A Little Help needed moving from lying on your back to sitting on the side of a flat bed without using bedrails?: A Little Help needed moving to and from a bed to a chair (including a wheelchair)?: A Little Help needed standing up from a chair using your arms (e.g., wheelchair or bedside chair)?: A Little Help needed to walk in hospital room?: A Little Help needed climbing 3-5 steps with a railing? : A Lot 6 Click Score: 17    End of Session Equipment Utilized During Treatment: Gait belt Activity Tolerance: Patient tolerated treatment well Patient left: in chair;with call bell/phone within reach   PT Visit Diagnosis:  Unsteadiness on feet (R26.81);Muscle weakness (generalized) (M62.81)     Time: 3875-6433 PT Time Calculation (min) (ACUTE ONLY): 26 min  Charges:    $Gait Training: 23-37 mins PT General Charges $$ ACUTE PT VISIT: 1 Visit                     Westen Dinino, PT  Acute Rehab Dept [facility]) [phone]  04/22/2023    [facility] 04/22/2023, 12:51 PM

## 2023-04-22 NOTE — Progress Notes (Signed)
PROGRESS NOTE    Nicole Oconnor  ZOX:[id number] DOB: [birth year] DOA: 04/09/2023 PCP: Michaelle Birks [facility]   Brief Narrative: Nicole Oconnor is a 77 y.o. female with a history of pheochromocytoma status post open left adrenalectomy, solitary right kidney, small bowel obstruction status post exploratory laparotomy with lysis of adhesions, hypertension.  Patient presents secondary to abdominal pain nausea, vomiting with evidence of small bowel obstruction.  General surgery was consulted.  Patient managed conservatively with an NG tube with failure of treatment.  Patient underwent exploratory laparotomy on 8/20 with lysis of adhesions.  TPN started status post surgery.  Diet advancing well.   Assessment and Plan:  Small bowel obstruction History of multiple abdominal surgeries in addition to a prior SBO with history of exploratory laparotomy and lysis of adhesions.  This admission, patient was initially managed with NG tube but failed conservative management.  Patient underwent an exploratory laparotomy with lysis of adhesions on 8/20 by general surgery.  TPN started from nutrition management. Leukocytosis resolved. -General Surgery recommendations: Discontinuing TPN, regular diet  Primary hypertension Patient is managed on atenolol, olmesartan, hydrochlorothiazide as an outpatient.  Antihypertensives held.  Patient is currently normotensive. -Continue to hold antihypertensives at this time.  May need to discontinue on discharge.  AKI Creatinine of 2.5 on admission.  Resolved with hydration.  Postoperative anemia Hemoglobin of 12 prior to surgery with acute drop to 9.5.  Estimated blood loss documented at 75 mL.  Patient has had hemoglobin drift down to as low as 7.7 this morning.  No overt evidence of bleeding noted. -Repeat CBC in a.m.  Elevated AST/ALT Unclear etiology.  Trending up on TPN.  TPN now discontinued.  No associated abdominal pain. -CMP in a.m.  Hyponatremia Mild.   Resolved.  Hypokalemia Mild.  Resolved.  Cough -Continue Tessalon as needed  Hypermagnesemia Likely secondary to acute kidney injury.  Resolved.  Hypomagnesemia Mild.  Now resolved.  Obesity Estimated body mass index is 34.21 kg/m as calculated from the following:   Height as of this encounter: 5\' 3"  (1.6 m).   Weight as of this encounter: 87.6 kg.   DVT prophylaxis: Subcutaneous heparin Code Status:   Code Status: Full Code Family Communication: None at bedside Disposition Plan: Discharge home likely in 2 to 4 days pending continue general surgery recommendations in addition to stable hemoglobin   Consultants:  General surgery  Procedures:  8/20: Exploratory laparotomy; Lysis of adhesions  Antimicrobials: None    Subjective: Patient has been up and out of bed. No issues overnight. Ate well yesterday but did not like the food much. No abdominal pain.  Objective: BP 108/65 (BP Location: Left Arm)   Pulse 71   Temp 98 F (36.7 C) (Oral)   Resp (!) 22   Ht 5\' 3"  (1.6 m)   Wt 87.6 kg   SpO2 100%   BMI 34.21 kg/m   Examination:  General exam: Appears calm and comfortable Respiratory system: Clear to auscultation. Respiratory effort normal. Cardiovascular system: S1 & S2 heard, RRR. No murmurs, rubs, gallops or clicks. Gastrointestinal system: Abdomen is nondistended, soft and nontender. Normal bowel sounds heard. Incision staples in place. Central nervous system: Alert and oriented. No focal neurological deficits. Musculoskeletal: No edema. No calf tenderness Skin: No cyanosis. No rashes Psychiatry: Judgement and insight appear normal. Mood & affect appropriate.    Data Reviewed: I have personally reviewed following labs and imaging studies  CBC Lab Results  Component Value Date   WBC 8.9 04/22/2023  RBC 2.76 (L) 04/22/2023   HGB 7.7 (L) 04/22/2023   HCT 25.0 (L) 04/22/2023   MCV 90.6 04/22/2023   MCH 27.9 04/22/2023   PLT 288 04/22/2023   MCHC  30.8 04/22/2023   RDW 15.9 (H) 04/22/2023   LYMPHSABS 0.7 04/09/2023   MONOABS 0.8 04/09/2023   EOSABS 0.0 04/09/2023   BASOSABS 0.0 04/09/2023     Last metabolic panel Lab Results  Component Value Date   NA 138 04/22/2023   K 4.2 04/22/2023   CL 106 04/22/2023   CO2 23 04/22/2023   BUN 33 (H) 04/22/2023   CREATININE 0.65 04/22/2023   GLUCOSE 107 (H) 04/22/2023   GFRNONAA >60 04/22/2023   CALCIUM 9.0 04/22/2023   PHOS 3.4 04/22/2023   PROT 6.1 (L) 04/22/2023   ALBUMIN 2.6 (L) 04/22/2023   BILITOT 0.3 04/22/2023   ALKPHOS 191 (H) 04/22/2023   AST 79 (H) 04/22/2023   ALT 132 (H) 04/22/2023   ANIONGAP 9 04/22/2023    GFR: Estimated Creatinine Clearance: 60.8 mL/min (by C-G formula based on SCr of 0.65 mg/dL).  No results found for this or any previous visit (from the past 240 hour(s)).    Radiology Studies: No results found.    LOS: 13 days    Jacquelin Hawking, MD [facility] 04/22/2023, 9:32 AM   If 7PM-7AM, please contact night-coverage [email or web address]

## 2023-04-23 DIAGNOSIS — K56609 Unspecified intestinal obstruction, unspecified as to partial versus complete obstruction: Secondary | ICD-10-CM | POA: Diagnosis not present

## 2023-04-23 LAB — COMPREHENSIVE METABOLIC PANEL
ALT: 116 U/L — ABNORMAL HIGH (ref 0–44)
AST: 55 U/L — ABNORMAL HIGH (ref 15–41)
Albumin: 2.7 g/dL — ABNORMAL LOW (ref 3.5–5.0)
Alkaline Phosphatase: 178 U/L — ABNORMAL HIGH (ref 38–126)
Anion gap: 9 (ref 5–15)
BUN: 30 mg/dL — ABNORMAL HIGH (ref 8–23)
CO2: 21 mmol/L — ABNORMAL LOW (ref 22–32)
Calcium: 9 mg/dL (ref 8.9–10.3)
Chloride: 108 mmol/L (ref 98–111)
Creatinine, Ser: 0.68 mg/dL (ref 0.44–1.00)
GFR, Estimated: >60 mL/min (ref >60)
Glucose, Bld: 117 mg/dL — ABNORMAL HIGH (ref 70–99)
Potassium: 4.1 mmol/L (ref 3.5–5.1)
Sodium: 138 mmol/L (ref 135–145)
Total Bilirubin: 0.4 mg/dL (ref 0.3–1.2)
Total Protein: 6.7 g/dL (ref 6.5–8.1)

## 2023-04-23 LAB — CBC
HCT: 26.4 % — ABNORMAL LOW (ref 36.0–46.0)
Hemoglobin: 8.1 g/dL — ABNORMAL LOW (ref 12.0–15.0)
MCH: 27.9 pg (ref 26.0–34.0)
MCHC: 30.7 g/dL (ref 30.0–36.0)
MCV: 91 fL (ref 80.0–100.0)
Platelets: 344 10*3/uL (ref 150–400)
RBC: 2.9 MIL/uL — ABNORMAL LOW (ref 3.87–5.11)
RDW: 16.2 % — ABNORMAL HIGH (ref 11.5–15.5)
WBC: 7.6 10*3/uL (ref 4.0–10.5)
nRBC: 0 % (ref 0.0–0.2)

## 2023-04-23 NOTE — Discharge Instructions (Signed)
ABDOMINAL SURGERY: POST OP INSTRUCTIONS  DIET: Follow a light bland diet the first 24 hours after arrival home, such as soup, liquids, crackers, etc.  Be sure to include lots of fluids daily.  Avoid fast food or heavy meals as your are more likely to get nauseated.  Eat a low fat the next few days after surgery.   Take your usually prescribed home medications unless otherwise directed. PAIN CONTROL: Pain is best controlled by a usual combination of three different methods TOGETHER: Ice/Heat Over the counter pain medication Prescription pain medication Most patients will experience some swelling and bruising around the incisions.  Ice packs or heating pads (30-60 minutes up to 6 times a day) will help. Use ice for the first few days to help decrease swelling and bruising, then switch to heat to help relax tight/sore spots and speed recovery.  Some people prefer to use ice alone, heat alone, alternating between ice & heat.  Experiment to what works for you.  Swelling and bruising can take several weeks to resolve.   It is helpful to take an over-the-counter pain medication regularly for the first few weeks.  Choose one of the following that works best for you: Naproxen (Aleve, etc)  Two 220mg  tabs twice a day Ibuprofen (Advil, etc) Three 200mg  tabs four times a day (every meal & bedtime) Acetaminophen (Tylenol, etc) 500-650mg  four times a day (every meal & bedtime) A  prescription for pain medication (such as oxycodone, hydrocodone, etc) should be given to you upon discharge.  Take your pain medication as prescribed.  If you are having problems/concerns with the prescription medicine (does not control pain, nausea, vomiting, rash, itching, etc), please call us [phone] to see if we need to switch you to a different pain medicine that will work better for you and/or control your side effect better. If you need a refill on your pain medication, please contact your pharmacy.  They will contact our  office to request authorization. Prescriptions will not be filled after 5 pm or on week-ends. Avoid getting constipated.  Between the surgery and the pain medications, it is common to experience some constipation.  Increasing fluid intake and taking a fiber supplement (such as Metamucil, Citrucel, FiberCon, MiraLax, etc) 1-2 times a day regularly will usually help prevent this problem from occurring.  A mild laxative (prune juice, Milk of Magnesia, MiraLax, etc) should be taken according to package directions if there are no bowel movements after 48 hours.   Watch out for diarrhea.  If you have many loose bowel movements, simplify your diet to bland foods & liquids for a few days.  Stop any stool softeners and decrease your fiber supplement.  Switching to mild anti-diarrheal medications (Kayopectate, Pepto Bismol) can help.  If this worsens or does not improve, please call us. Wash / shower every day.  You may shower over the incision / wound.  Avoid baths until the skin is fully healed.  Continue to shower over incision(s) after the dressing is off. Remove your waterproof bandages 5 days after surgery.  You may leave the incision open to air.  You may replace a dressing/Band-Aid to cover the incision for comfort if you wish. ACTIVITIES as tolerated:   You may resume regular (light) daily activities beginning the next day--such as daily self-care, walking, climbing stairs--gradually increasing activities as tolerated.  If you can walk 30 minutes without difficulty, it is safe to try more intense activity such as jogging, treadmill, bicycling, low-impact aerobics, swimming,  etc. Save the most intensive and strenuous activity for last such as sit-ups, heavy lifting, contact sports, etc  Refrain from any heavy lifting or straining until you are off narcotics for pain control.   DO NOT PUSH THROUGH PAIN.  Let pain be your guide: If it hurts to do something, don't do it.  Pain is your body warning you to avoid  that activity for another week until the pain goes down. You may drive when you are no longer taking prescription pain medication, you can comfortably wear a seatbelt, and you can safely maneuver your car and apply brakes. You may have sexual intercourse when it is comfortable.  FOLLOW UP in our office Please call [facility] at [phone] to set up an appointment to see your surgeon in the office for a follow-up appointment approximately 1-2 weeks after your surgery. Make sure that you call for this appointment the day you arrive home to insure a convenient appointment time. 10. IF YOU HAVE DISABILITY OR FAMILY LEAVE FORMS, BRING THEM TO THE OFFICE FOR PROCESSING.  DO NOT GIVE THEM TO YOUR DOCTOR.   WHEN TO CALL us [phone]: Poor pain control Reactions / problems with new medications (rash/itching, nausea, etc)  Fever over 101.5 F (38.5 C) Inability to urinate Nausea and/or vomiting Worsening swelling or bruising Continued bleeding from incision. Increased pain, redness, or drainage from the incision  The clinic staff is available to answer your questions during regular business hours (8:30am-5pm).  Please don't hesitate to call and ask to speak to one of our nurses for clinical concerns.   A surgeon from [facility] is always on call at the hospitals   If you have a medical emergency, go to the nearest emergency room or call 911.    [facility] [address], [place], [place]  [address] ? MAIN: [phone] ? TOLL FREE: [phone] ? FAX [phone] [email or web address]

## 2023-04-23 NOTE — TOC Transition Note (Signed)
Transition of Care [facility]) - CM/SW Discharge Note  Patient Details  Name: Nicole Oconnor MRN: [id number] Date of Birth: 10/23/1945  [facility]) CM/SW Contact:  Ewing Schlein, LCSW Phone Number: 04/23/2023, 12:25 PM  Clinical Narrative: PT/OT evaluations recommended HH. Patient's son agreeable to CSW making a HH reerral. CSW made HHPT/OT referral to Cind[facility] with [facility], which was accepted. CSW updated son regarding [facility] agency. HH orders placed by hospitalist. TOC signing off.  Final next level of care: Home w Home Health Services Barriers to Discharge: Barriers Resolved  Patient Goals and CMS Choice CMS Medicare.gov Compare Post Acute Care list provided to:: Patient Represenative (must comment) Choice offered to / list presented to : Adult Children  Discharge Plan and Services Additional resources added to the After Visit Summary for      DME Arranged: N/A DME Agency: NA HH Arranged: PT, OT HH Agency: [facility] Date [facility] Agency Contacted: 04/23/23 Time HH Agency Contacted: 1158 Representative spoke with at [facility] Agency: Cindie  Social Determinants of Health (SDOH) Interventions SDOH Screenings   Food Insecurity: No Food Insecurity (04/09/2023)  Housing: Low Risk  (04/09/2023)  Transportation Needs: Unmet Transportation Needs (04/09/2023)  Utilities: Not At Risk (04/09/2023)  Depression (PHQ2-9): Low Risk  (06/14/2020)  Financial Resource Strain: Low Risk  (06/14/2020)  Physical Activity: Inactive (06/14/2020)  Stress: No Stress Concern Present (06/14/2020)  Tobacco Use: Low Risk  (04/14/2023)   Readmission Risk Interventions    04/10/2023   10:29 AM  Readmission Risk Prevention Plan  Transportation Screening Complete  PCP or Specialist Appt within 5-7 Days Complete  Home Care Screening Complete  Medication Review (RN CM) Complete

## 2023-04-23 NOTE — Plan of Care (Signed)
?  Problem: Clinical Measurements: ?Goal: Will remain free from infection ?Outcome: Progressing ?Goal: Diagnostic test results will improve ?Outcome: Progressing ?Goal: Respiratory complications will improve ?Outcome: Progressing ?  ?

## 2023-04-23 NOTE — Progress Notes (Addendum)
   Progress Note  9 Days Post-Op  Subjective: Pt denies abdominal pain or nausea. Having bowel function. Tolerating a diet  Objective: Vital signs in last 24 hours: Temp:  [98.2 F (36.8 C)-98.7 F (37.1 C)] 98.3 F (36.8 C) (08/29 0644) Pulse Rate:  [83-86] 86 (08/29 0644) Resp:  [17-18] 17 (08/29 0800) BP: (101-111)/(63-66) 111/63 (08/29 0644) SpO2:  [95 %-99 %] 95 % (08/29 0644) Last BM Date : 04/22/23  Intake/Output from previous day: 08/28 0701 - 08/29 0700 In: 1601.1 [P.O.:1000; I.V.:436.1; IV Piggyback:165] Out: 300 [Urine:300] Intake/Output this shift: Total I/O In: 120 [P.O.:120] Out: -   PE: General: pleasant, WD, elderly female who is laying in bed in NAD Heart: regular, rate, and rhythm. Lungs: Respiratory effort nonlabored Abd: soft, NT, ND, incision C/D/I with staples present  Psych: A&Ox3 with an appropriate affect.  Wound clean, dry, intact  Lab Results:  Recent Labs    04/21/23 0543 04/22/23 0356  WBC 11.0* 8.9  HGB 7.8* 7.7*  HCT 25.3* 25.0*  PLT 261 288   BMET Recent Labs    04/21/23 0543 04/22/23 0356  NA 131* 138  K 4.6 4.2  CL 105 106  CO2 23 23  GLUCOSE 113* 107*  BUN 36* 33*  CREATININE 0.61 0.65  CALCIUM 8.4* 9.0   PT/INR No results for input(s): "LABPROT", "INR" in the last 72 hours. CMP     Component Value Date/Time   NA 138 04/22/2023 0356   NA 144 03/03/2018 0000   K 4.2 04/22/2023 0356   CL 106 04/22/2023 0356   CO2 23 04/22/2023 0356   GLUCOSE 107 (H) 04/22/2023 0356   BUN 33 (H) 04/22/2023 0356   BUN 15 03/03/2018 0000   CREATININE 0.65 04/22/2023 0356   CALCIUM 9.0 04/22/2023 0356   PROT 6.1 (L) 04/22/2023 0356   ALBUMIN 2.6 (L) 04/22/2023 0356   AST 79 (H) 04/22/2023 0356   ALT 132 (H) 04/22/2023 0356   ALKPHOS 191 (H) 04/22/2023 0356   BILITOT 0.3 04/22/2023 0356   GFRNONAA >60 04/22/2023 0356   Lipase     Component Value Date/Time   LIPASE 42 04/09/2023 1356       Studies/Results: No  results found.  Anti-infectives: Anti-infectives (From admission, onward)    Start     Dose/Rate Route Frequency Ordered Stop   04/14/23 1100  cefoTEtan (CEFOTAN) 2 g in sodium chloride 0.9 % 100 mL IVPB        2 g 200 mL/hr over 30 Minutes Intravenous On call to O.R. 04/14/23 0845 04/14/23 1330        Assessment/Plan SBO POD9 s/p ex-lap with LOA Dr. Corliss Skains - WBC normalized - tolerating diet and working on PO intake but does not like the food here, having bowel function  - ensure TID if nothing else at mealtimes but fine to eat outside food as well   - I think patient will eat better at home with food available that she likes so should work towards getting her home as soon as medically reasonable  Ok for d/c from my standpoint  Will remove staples and ensure discharge f/u Will d/c picc  FEN: Reg diet  VTE: SQH ID: [email or web address] pre-op  LOS: 14 days     Vanita Panda, MD [facility] 04/23/2023, 10:11 AM Please see Amion for pager number during day hours 7:00am-4:30pm

## 2023-04-23 NOTE — Discharge Summary (Signed)
Physician Discharge Summary   Patient: Nicole Oconnor MRN: [id number] DOB: 1945-10-23  Admit date:     04/09/2023  Discharge date: 04/23/23  Discharge Physician: Jacquelin Hawking, MD   PCP: Michaelle Birks [facility]   Recommendations at discharge:  PCP and general surgery follow-up Low fiber diet  Discharge Diagnoses: Principal Problem:   SBO (small bowel obstruction) (HCC) Active Problems:   Essential hypertension   Gout   Edema   H/O total adrenalectomy (HCC)   AKI (acute kidney injury) (HCC)   Protein-calorie malnutrition, severe  Resolved Problems:   * No resolved hospital problems. *  Hospital Course: Nicole Oconnor is a 78 y.o. female with a history of pheochromocytoma status post open left adrenalectomy, solitary right kidney, small bowel obstruction status post exploratory laparotomy with lysis of adhesions, hypertension.  Patient presents secondary to abdominal pain nausea, vomiting with evidence of small bowel obstruction.  General surgery was consulted.  Patient managed conservatively with an NG tube with failure of treatment.  Patient underwent exploratory laparotomy on 8/20 with lysis of adhesions.  TPN started status post surgery.  Diet advanced well. TPN discontinued prior to discharge.  Assessment and Plan:  Small bowel obstruction History of multiple abdominal surgeries in addition to a prior SBO with history of exploratory laparotomy and lysis of adhesions.  This admission, patient was initially managed with NG tube but failed conservative management.  Patient underwent an exploratory laparotomy with lysis of adhesions on 8/20 by general surgery.  TPN started from nutrition management. Leukocytosis resolved. TPN discontinued prior to discharge.   Primary hypertension Patient is managed on atenolol, olmesartan, hydrochlorothiazide as an outpatient.  Antihypertensives held.  Patient is currently normotensive. Discontinued atenolol, olmesartan and hydrochlorothiazide on  discharge.   AKI Creatinine of 2.5 on admission.  Resolved with hydration.   Postoperative anemia Hemoglobin of 12 prior to surgery with acute drop to 9.5.  Estimated blood loss documented at 75 mL.  Patient has had hemoglobin drift down to as low as 7.7 g/dL.  No overt evidence of bleeding noted. Hemoglobin stable at 8.1 g/dL prior to discharge.   Elevated AST/ALT Trended up on TPN.  TPN now discontinued.  No associated abdominal pain.  Severe malnutrition Patient seen by dietitian with recommendation for protein drink for supplementation.   Hyponatremia Mild.  Resolved.   Hypokalemia Mild.  Resolved.   Hypermagnesemia Likely secondary to acute kidney injury.  Resolved.   Hypomagnesemia Mild.  Now resolved.   Obesity Estimated body mass index is 34.21 kg/m as calculated from the following:   Height as of this encounter: 5\' 3"  (1.6 m).   Weight as of this encounter: 87.6 kg.   Consultants: General surgery Procedures performed: 8/20: Exploratory laparotomy; Lysis of adhesions  Disposition: Home Diet recommendation: Soft/Low fiber diet   DISCHARGE MEDICATION: Allergies as of 04/23/2023       Reactions   Penicillins Swelling        Medication List     STOP taking these medications    atenolol 25 MG tablet Commonly known as: TENORMIN   atenolol 50 MG tablet Commonly known as: TENORMIN   olmesartan-hydrochlorothiazide 20-12.5 MG tablet Commonly known as: BENICAR HCT   olmesartan-hydrochlorothiazide 40-12.5 MG tablet Commonly known as: BENICAR HCT       TAKE these medications    allopurinol 300 MG tablet Commonly known as: ZYLOPRIM TAKE 1 TABLET BY MOUTH EVERY DAY   aspirin 81 MG tablet Take 81 mg by mouth daily.   loratadine 10  MG tablet Commonly known as: CLARITIN TAKE 1 TABLET BY MOUTH EVERY DAY        Follow-up Information     [name], MD. Schedule an appointment as soon as possible for a visit in 2 week(s).   Specialties:  General Surgery, Colon and Rectal Surgery Contact information: [address] [place] [address] [phone]         [place], [place] [facility]. Schedule an appointment as soon as possible for a visit in 1 week(s).   Why: For hospital follow-up Contact information: [address] [place] [address] [phone]                Discharge Exam: BP 110/64 (BP Location: Left Arm)   Pulse 80   Temp 98.2 F (36.8 C) (Oral)   Resp 19   Ht 5\' 3"  (1.6 m)   Wt 87.6 kg   SpO2 98%   BMI 34.21 kg/m   General exam: Appears calm and comfortable Respiratory system: Clear to auscultation. Respiratory effort normal. Cardiovascular system: S1 & S2 heard, RRR. No murmurs, rubs, gallops or clicks. Gastrointestinal system: Abdomen is  soft and nontender. Normal bowel sounds heard. Central nervous system: Alert and oriented. No focal neurological deficits. Musculoskeletal: No edema. No calf tenderness  Condition at discharge: stable  The results of significant diagnostics from this hospitalization (including imaging, microbiology, ancillary and laboratory) are listed below for reference.   Imaging Studies: DG CHEST PORT 1 VIEW  Result Date: 04/16/2023 CLINICAL DATA:  Cough. EXAM: PORTABLE CHEST 1 VIEW COMPARISON:  February 07, 2005. FINDINGS: Stable cardiomediastinal silhouette. Nasogastric tube is seen entering stomach. Right-sided PICC line is noted. Hypoinflation of the lungs is noted. Right lung is clear. Minimal left basilar subsegmental atelectasis is noted. Bony thorax is unremarkable. IMPRESSION: Hypoinflation of the lungs with minimal left basilar subsegmental atelectasis. Electronically Signed   By: Lupita Raider M.D.   On: 04/16/2023 13:37   DG Abd Portable 2V  Result Date: 04/13/2023 CLINICAL DATA:  Small bowel obstruction. EXAM: PORTABLE ABDOMEN - 2 VIEW COMPARISON:  Abdominal radiographs 04/12/2023 FINDINGS: An enteric tube remains in place overlying the  region of the gastric body. There is persistent dilatation of bowel loops in the central abdomen demonstrating mild mixed interval changes compared to the prior study. Gas and a small amount of stool are present in the colon, and gas is also present in the rectum. IMPRESSION: Persistent small bowel dilatation suggestive of ongoing (potentially partial) obstruction. Electronically Signed   By: Sebastian Ache M.D.   On: 04/13/2023 14:58   [place] EKG SITE RITE  Result Date: 04/13/2023 If Site Rite image not attached, placement could not be confirmed due to current cardiac rhythm.  DG Abd Portable 2V  Result Date: 04/12/2023 CLINICAL DATA:  Concern for small bowel obstruction. EXAM: PORTABLE ABDOMEN - 2 VIEW COMPARISON:  Abdominal radiograph dated 04/11/2023. FINDINGS: Multiple gas-filled dilated loops of bowel are seen in the mid abdomen, measuring up to 5.8 cm in diameter. The degree of gaseous distention appears slightly increased compared to 04/11/2023, but similar to 04/10/2023. Air-fluid levels are noted. An enteric tube terminates in the stomach. A few locules of gas overlie the rectum. Degenerative changes are seen in the spine and hips. IMPRESSION: Multiple gas-filled dilated loops of bowel in the mid abdomen, slightly increased compared to 04/11/2023, but similar to 04/10/2023. These findings likely reflect small bowel obstruction. Electronically Signed   By: Romona Curls M.D.   On:  04/12/2023 10:04   DG Abd Portable 1V-Small Bowel Obstruction Protocol-initial, 8 hr delay  Result Date: 04/11/2023 CLINICAL DATA:  Small-bowel obstruction, 8 hour delayed examination EXAM: PORTABLE ABDOMEN - 1 VIEW COMPARISON:  04/10/2023, CT 04/09/2023 FINDINGS: Administered barium contrast is seen within the expected gastric and duodenal lumen but does not appear to have progressed into the proximal small bowel. Nasogastric tube overlies the gastric lumen. Abdominal gas pattern is unchanged. No gross free intraperitoneal  gas. IMPRESSION: 1. Persistent small bowel obstruction. Administered oral contrast opacifies the gastric and proximal duodenal lumen. Electronically Signed   By: Helyn Numbers M.D.   On: 04/11/2023 01:29   DG Abd Portable 1V-Small Bowel Protocol-Position Verification  Result Date: 04/10/2023 CLINICAL DATA:  [id number] Encounter for imaging study to confirm nasogastric (NG) tube placement [id number]. EXAM: PORTABLE ABDOMEN - 1 VIEW COMPARISON:  04/10/2023 at 0851 hours. FINDINGS: Enteric tube tip and side port project over the stomach. IMPRESSION: Enteric tube tip and side port project over the stomach. Electronically Signed   By: Orvan Falconer M.D.   On: 04/10/2023 14:55   DG Abd Portable 1V  Result Date: 04/10/2023 CLINICAL DATA:  SBO (small bowel obstruction) (HCC) EXAM: PORTABLE ABDOMEN - 1 VIEW COMPARISON:  CT abdomen/pelvis 04/09/2023. FINDINGS: Similar marked dilation of proximal bowel loops, compatible with known obstruction better characterized on recent CT. No obvious evidence of free air on this limited supine radiograph. No abnormal calcifications. Left upper quadrant clips. Polyarticular degenerative change. IMPRESSION: Similar marked dilation of proximal bowel loops, compatible with known obstruction better characterized on recent CT. Electronically Signed   By: Feliberto Harts M.D.   On: 04/10/2023 09:08   CT ABDOMEN PELVIS WO CONTRAST  Result Date: 04/09/2023 CLINICAL DATA:  Emesis and abdominal pain for 2 days. EXAM: CT ABDOMEN AND PELVIS WITHOUT CONTRAST TECHNIQUE: Multidetector CT imaging of the abdomen and pelvis was performed following the standard protocol without IV contrast. RADIATION DOSE REDUCTION: This exam was performed according to the departmental dose-optimization program which includes automated exposure control, adjustment of the mA and/or kV according to patient size and/or use of iterative reconstruction technique. COMPARISON:  None Available. FINDINGS: Lower chest: Slight  linear opacity lung bases likely scar or atelectasis. No pleural effusion. Coronary artery calcifications are seen. Hepatobiliary: With the limits of non IV contrast, grossly the hepatic parenchyma is preserved. Gallbladder is nondilated. Pancreas: Unremarkable. No pancreatic ductal dilatation or surrounding inflammatory changes. Spleen: Normal in size without focal abnormality. Adrenals/Urinary Tract: The right adrenal gland is preserved. No abnormal calcification seen in the right kidney nor along the course of the right ureter. Previous resection of the left adrenal gland and left kidney is noted. No abnormal soft tissue in the surgical bed on this noncontrast exam. Preserved contours of the underdistended urinary bladder. Stomach/Bowel: Dilated stomach with air, fluid and debris. The duodenal is also dilated as is the proximal small bowel, jejunum. There is transition point in the left hemipelvis. No wall thickening or inflammatory changes. No pneumatosis. The distal small bowel and colon are decompressed. Scattered colonic stool. Vascular/Lymphatic: Aortic atherosclerosis. No enlarged abdominal or pelvic lymph nodes. Reproductive: Uterus and bilateral adnexa are unremarkable. Other: Patient is tilted in the scanner. There is motion throughout the examination limiting evaluation. Musculoskeletal: Moderate degenerative changes of the spine and pelvis. Trace anterolisthesis of L4 on L5. IMPRESSION: Dilated stomach, duodenal and proximal small bowel up to 4.5 cm with a transition point in the left hemipelvis. Developing or partial obstruction. No free air, pneumatosis  or free fluid. Surgical changes from previous left adrenalectomy and nephrectomy. Please correlate with clinical history. Electronically Signed   By: Karen Kays M.D.   On: 04/09/2023 16:48    Microbiology: Results for orders placed or performed during the hospital encounter of 04/09/23  SARS Coronavirus 2 by RT PCR (hospital order, performed in  [facility] hospital lab) *cepheid single result test* Anterior Nasal Swab     Status: None   Collection Time: 04/09/23  2:47 PM   Specimen: Anterior Nasal Swab  Result Value Ref Range Status   SARS Coronavirus 2 by RT PCR NEGATIVE NEGATIVE Final    Comment: (NOTE) SARS-CoV-2 target nucleic acids are NOT DETECTED.  The SARS-CoV-2 RNA is generally detectable in upper and lower respiratory specimens during the acute phase of infection. The lowest concentration of SARS-CoV-2 viral copies this assay can detect is 250 copies / mL. A negative result does not preclude SARS-CoV-2 infection and should not be used as the sole basis for treatment or other patient management decisions.  A negative result may occur with improper specimen collection / handling, submission of specimen other than nasopharyngeal swab, presence of viral mutation(s) within the areas targeted by this assay, and inadequate number of viral copies (<250 copies / mL). A negative result must be combined with clinical observations, patient history, and epidemiological information.  Fact Sheet for Patients:   RoadLapTop.co.za  Fact Sheet for Healthcare Providers: http://kim-miller.com/  This test is not yet approved or  cleared by the [place] FDA and has been authorized for detection and/or diagnosis of SARS-CoV-2 by FDA under an Emergency Use Authorization (EUA).  This EUA will remain in effect (meaning this test can be used) for the duration of the COVID-19 declaration under Section 564(b)(1) of the Act, 21 U.S.C. section 360bbb-3(b)(1), unless the authorization is terminated or revoked sooner.  Performed at [facility], [address] [place], [place] [address]     Labs: CBC: Recent Labs  Lab 04/19/23 [phone] 04/20/23 0322 04/21/23 0543 04/22/23 0356 04/23/23 1030  WBC 13.6* 12.6* 11.0* 8.9 7.6  HGB 9.0* 7.9* 7.8* 7.7* 8.1*  HCT 29.1* 26.1* 25.3*  25.0* 26.4*  MCV 92.4 92.6 89.4 90.6 91.0  PLT 221 220 261 288 344   Basic Metabolic Panel: Recent Labs  Lab 04/17/23 0245 04/18/23 0234 04/19/23 0317 04/20/23 0322 04/21/23 0543 04/22/23 0356 04/23/23 1030  NA 142 143 140 138 131* 138 138  K 3.8 4.4 4.6 4.5 4.6 4.2 4.1  CL 107 113* 115* 112* 105 106 108  CO2 28 24 20* 21* 23 23 21*  GLUCOSE 124* 129* 124* 125* 113* 107* 117*  BUN 35* 35* 38* 38* 36* 33* 30*  CREATININE 0.71 0.68 0.66 0.62 0.61 0.65 0.68  CALCIUM 8.7* 8.9 8.9 8.6* 8.4* 9.0 9.0  MG  --  1.9  --  1.5* 1.9 1.9  --   PHOS 2.9 3.4  --  3.6 4.1 3.4  --    Liver Function Tests: Recent Labs  Lab 04/20/23 0322 04/21/23 0543 04/22/23 0356 04/23/23 1030  AST 52* 44* 79* 55*  ALT 90* 87* 132* 116*  ALKPHOS 170* 194* 191* 178*  BILITOT 0.6 0.4 0.3 0.4  PROT 6.0* 5.5* 6.1* 6.7  ALBUMIN 2.5* 2.5* 2.6* 2.7*   CBG: Recent Labs  Lab 04/18/23 0611 04/18/23 1153 04/18/23 1740 04/19/23 0011 04/19/23 0549  GLUCAP 120* 107* 107* 124* 118*    Discharge time spent: 35 minutes.  Signed: Jacquelin Hawking,  MD [facility] 04/23/2023

## 2023-04-23 NOTE — Plan of Care (Signed)
  Problem: Health Behavior/Discharge Planning: Goal: Ability to manage health-related needs will improve Outcome: Completed/Met   Problem: Clinical Measurements: Goal: Ability to maintain clinical measurements within normal limits will improve Outcome: Completed/Met Goal: Will remain free from infection Outcome: Completed/Met Goal: Diagnostic test results will improve Outcome: Completed/Met Goal: Respiratory complications will improve Outcome: Completed/Met Goal: Cardiovascular complication will be avoided Outcome: Completed/Met   Problem: Activity: Goal: Risk for activity intolerance will decrease Outcome: Completed/Met   Problem: Nutrition: Goal: Adequate nutrition will be maintained Outcome: Completed/Met   Problem: Coping: Goal: Level of anxiety will decrease Outcome: Completed/Met   Problem: Elimination: Goal: Will not experience complications related to bowel motility Outcome: Completed/Met   Problem: Pain Managment: Goal: General experience of comfort will improve Outcome: Completed/Met   Problem: Safety: Goal: Ability to remain free from injury will improve Outcome: Completed/Met   Problem: Skin Integrity: Goal: Risk for impaired skin integrity will decrease Outcome: Completed/Met

## 2023-06-19 NOTE — Plan of Care (Signed)
 [facility] Tonsillectomy/Adenoidectomy, Postoperative PEDS care plan entered in error.

## 2023-09-06 ENCOUNTER — Emergency Department (HOSPITAL_BASED_OUTPATIENT_CLINIC_OR_DEPARTMENT_OTHER): Payer: Medicare Other

## 2023-09-06 ENCOUNTER — Other Ambulatory Visit: Payer: Self-pay

## 2023-09-06 ENCOUNTER — Emergency Department (HOSPITAL_BASED_OUTPATIENT_CLINIC_OR_DEPARTMENT_OTHER)
Admission: EM | Admit: 2023-09-06 | Discharge: 2023-09-06 | Disposition: A | Discharge status: Home / Self Care | Payer: Medicare Other | Attending: Student | Admitting: Student

## 2023-09-06 ENCOUNTER — Encounter (HOSPITAL_BASED_OUTPATIENT_CLINIC_OR_DEPARTMENT_OTHER): Payer: Self-pay | Admitting: Emergency Medicine

## 2023-09-06 DIAGNOSIS — Y92000 Kitchen of unspecified non-institutional (private) residence as  the place of occurrence of the external cause: Secondary | ICD-10-CM | POA: Insufficient documentation

## 2023-09-06 DIAGNOSIS — I10 Essential (primary) hypertension: Secondary | ICD-10-CM | POA: Insufficient documentation

## 2023-09-06 DIAGNOSIS — Z7982 Long term (current) use of aspirin: Secondary | ICD-10-CM | POA: Insufficient documentation

## 2023-09-06 DIAGNOSIS — W010XXA Fall on same level from slipping, tripping and stumbling without subsequent striking against object, initial encounter: Secondary | ICD-10-CM | POA: Diagnosis not present

## 2023-09-06 DIAGNOSIS — M79675 Pain in left toe(s): Secondary | ICD-10-CM | POA: Diagnosis present

## 2023-09-06 MED ORDER — ACETAMINOPHEN 325 MG PO TABS
650.0000 mg | ORAL_TABLET | Freq: Once | ORAL | Status: AC
  Administered 2023-09-06: 650 mg via ORAL
  Filled 2023-09-06: qty 2

## 2023-09-06 NOTE — ED Triage Notes (Signed)
 Pt tripped and injured left large toe.  Pt states it hurts with ambulation and movement.

## 2023-09-06 NOTE — ED Provider Notes (Signed)
 [place] [facility] Provider Note   CSN: [id number] Arrival date & time: 09/06/23  1607     History  Chief Complaint  Patient presents with   Fall    Nicole Oconnor is a 78 y.o. female.   Fall   78 year old female presents emergency department with complaints of left great toe pain.  Patient states that she tripped on an area rug when she was in the kitchen yesterday evening.  States that she caught herself with her arms on the sink nearby but when she went to steady herself, accidentally struck beneath the sink cabinet with her left great toe.  Denies any subsequent fall or trauma elsewhere.  States she has had pain with ambulation but has been able to ambulate since then.  Has taken no medication for her symptoms.  Denies any fever, weakness or sensory deficits in affected foot.  Past medical history significant for gout, hypertension, pheochromocytoma  Home Medications Prior to Admission medications   Medication Sig Start Date End Date Taking? Authorizing Provider  allopurinol  (ZYLOPRIM ) 300 MG tablet TAKE 1 TABLET BY MOUTH EVERY DAY Patient taking differently: Take 300 mg by mouth daily. 01/06/22   Georgina Speaks, FNP  aspirin 81 MG tablet Take 81 mg by mouth daily.    [provider]  loratadine  (CLARITIN ) 10 MG tablet TAKE 1 TABLET BY MOUTH EVERY DAY Patient taking differently: Take 10 mg by mouth daily. 04/01/21   Jarold Medici, MD      Allergies    Penicillins, Penicillin g, and Wound dressing adhesive    Review of Systems   Review of Systems  All other systems reviewed and are negative.   Physical Exam Updated Vital Signs BP (!) 130/105 (BP Location: Right Arm)   Pulse 76   Temp 97.9 F (36.6 C)   Resp 18   Ht 5' 3 (1.6 m)   Wt 82.6 kg   SpO2 100%   BMI 32.24 kg/m  Physical Exam Vitals and nursing note reviewed.  Constitutional:      General: She is not in acute distress.    Appearance: She is  well-developed.  HENT:     Head: Normocephalic and atraumatic.  Eyes:     Conjunctiva/sclera: Conjunctivae normal.  Cardiovascular:     Rate and Rhythm: Normal rate and regular rhythm.  Pulmonary:     Effort: Pulmonary effort is normal. No respiratory distress.     Breath sounds: Normal breath sounds.  Abdominal:     Palpations: Abdomen is soft.     Tenderness: There is no abdominal tenderness.  Musculoskeletal:        General: No swelling.     Cervical back: Neck supple.     Comments: No tenderness of left ankle.  Tender to palpation MCP joint left first toe as well as IP joint.  No obvious gross deformity.  Intact sensation distally.  Cap refill less than 2 seconds.  Skin:    General: Skin is warm and dry.     Capillary Refill: Capillary refill takes less than 2 seconds.  Neurological:     Mental Status: She is alert.  Psychiatric:        Mood and Affect: Mood normal.     ED Results / Procedures / Treatments   Labs (all labs ordered are listed, but only abnormal results are displayed) Labs Reviewed - No data to display  EKG None  Radiology DG Foot Complete Left Result Date: 09/06/2023 CLINICAL  DATA:  Left great toe pain after fall yesterday. EXAM: LEFT FOOT - COMPLETE 3+ VIEW COMPARISON:  None Available. FINDINGS: No acute fracture or dislocation. Severe pes planus. Mild degenerative changes of the first MTP joint and posterior tibiotalar joint. Osteopenia. Mild diffuse soft tissue swelling. IMPRESSION: 1. No acute osseous abnormality. 2. Severe pes planus. Electronically Signed   By: Elsie ONEIDA Shoulder M.D.   On: 09/06/2023 17:16    Procedures Procedures    Medications Ordered in ED Medications  acetaminophen  (TYLENOL ) tablet 650 mg (650 mg Oral Given 09/06/23 1720)    ED Course/ Medical Decision Making/ A&P                                 Medical Decision Making Amount and/or Complexity of Data Reviewed Radiology: ordered.  Risk OTC drugs.   This patient  presents to the ED for concern of left great toe pain, this involves an extensive number of treatment options, and is a complaint that carries with it a high risk of complications and morbidity.  The differential diagnosis includes, dislocation, strain/sprain, ligamentous/tendinous injury, neurovascular compromise, gout, other   Co morbidities that complicate the patient evaluation  See HPI   Additional history obtained:  Additional history obtained from EMR External records from outside source obtained and reviewed including hospital records   Lab Tests:  N/a   Imaging Studies ordered:  I ordered imaging studies including left foot x-ray  I independently visualized and interpreted imaging which showed no acute osseous abnormality.  Pes planus. I agree with the radiologist interpretation   Cardiac Monitoring: / EKG:  The patient was maintained on a cardiac monitor.  I personally viewed and interpreted the cardiac monitored which showed an underlying rhythm of: Sinus rhythm   Consultations Obtained:  N/a   Problem List / ED Course / Critical interventions / Medication management  Left great toe pain I ordered medication including Tylenol    Reevaluation of the patient after these medicines showed that the patient improved I have reviewed the patients home medicines and have made adjustments as needed   Social Determinants of Health:  Denies tobacco or illicit drug use   Test / Admission - Considered:  Left great toe pain Vitals signs within normal range and stable throughout visit. Imaging studies significant for: see above 78 year old female presents emergency department complaints of left great toe pain after striking her foot beneath the sink when bracing her self during a fall.  Denies trauma elsewhere.  On exam, tenderness of affected digit.  No evidence of neurovascular compromise.  X-ray obtained which was negative for any acute osseous abnormality.  Patient  reassured by findings.  Recommend rest, ice, elevation, Tylenol  for treatment of pain and follow-up with primary care.  Patient also given postop shoe to help aid in symptomatic relief until area heals.  Treatment plan discussed at length with patient and she acknowledged understanding was agreeable to said plan.  Patient overall well-appearing, afebrile in no acute distress. Worrisome signs and symptoms were discussed with the patient, and the patient acknowledged understanding to return to the ED if noticed. Patient was stable upon discharge.          Final Clinical Impression(s) / ED Diagnoses Final diagnoses:  Great toe pain, left    Rx / DC Orders ED Discharge Orders     None         Silver Wonda LABOR, [place] 09/06/23 1731  Albertina Dixon, MD 09/07/23 1320

## 2023-09-06 NOTE — Discharge Instructions (Addendum)
 As discussed, x-ray negative for fracture or dislocation.  Will recommend icing toe to help with pain/inflammation as well as using Tylenol  for any pain.  Will also give you a postop shoe to help relieve some of the pressure sensation on the front of your toe.  Recommend follow-up with your primary care for reassessment.  Please do not hesitate to return to the emergency department if the worrisome signs and symptoms we discussed become apparent.
